# Patient Record
Sex: Female | Born: 1940 | Race: White | Hispanic: No | Smoking: Never smoker
Health system: Southern US, Community
[De-identification: ages and names within clinical notes are randomized; demographics above are authoritative.]

## PROBLEM LIST (undated history)

## (undated) DIAGNOSIS — M8580 Other specified disorders of bone density and structure, unspecified site: Secondary | ICD-10-CM

## (undated) DIAGNOSIS — R519 Headache, unspecified: Secondary | ICD-10-CM

## (undated) DIAGNOSIS — I10 Essential (primary) hypertension: Secondary | ICD-10-CM

## (undated) DIAGNOSIS — D691 Qualitative platelet defects: Secondary | ICD-10-CM

## (undated) DIAGNOSIS — F329 Major depressive disorder, single episode, unspecified: Secondary | ICD-10-CM

## (undated) DIAGNOSIS — F419 Anxiety disorder, unspecified: Secondary | ICD-10-CM

## (undated) DIAGNOSIS — E119 Type 2 diabetes mellitus without complications: Secondary | ICD-10-CM

## (undated) DIAGNOSIS — IMO0001 Reserved for inherently not codable concepts without codable children: Secondary | ICD-10-CM

## (undated) DIAGNOSIS — K219 Gastro-esophageal reflux disease without esophagitis: Secondary | ICD-10-CM

## (undated) DIAGNOSIS — J45909 Unspecified asthma, uncomplicated: Secondary | ICD-10-CM

## (undated) DIAGNOSIS — N390 Urinary tract infection, site not specified: Secondary | ICD-10-CM

## (undated) DIAGNOSIS — F0390 Unspecified dementia without behavioral disturbance: Secondary | ICD-10-CM

## (undated) DIAGNOSIS — G4730 Sleep apnea, unspecified: Secondary | ICD-10-CM

## (undated) DIAGNOSIS — C801 Malignant (primary) neoplasm, unspecified: Secondary | ICD-10-CM

## (undated) DIAGNOSIS — M1990 Unspecified osteoarthritis, unspecified site: Secondary | ICD-10-CM

## (undated) DIAGNOSIS — F32A Depression, unspecified: Secondary | ICD-10-CM

## (undated) DIAGNOSIS — R51 Headache: Secondary | ICD-10-CM

## (undated) DIAGNOSIS — I739 Peripheral vascular disease, unspecified: Secondary | ICD-10-CM

## (undated) HISTORY — DX: Depression, unspecified: F32.A

## (undated) HISTORY — PX: FOOT FRACTURE SURGERY: SHX645

## (undated) HISTORY — PX: COCHLEAR IMPLANT: SUR684

## (undated) HISTORY — PX: INTRAOCULAR LENS INSERTION: SHX110

## (undated) HISTORY — DX: Unspecified osteoarthritis, unspecified site: M19.90

## (undated) HISTORY — DX: Gastro-esophageal reflux disease without esophagitis: K21.9

## (undated) HISTORY — DX: Anxiety disorder, unspecified: F41.9

## (undated) HISTORY — DX: Qualitative platelet defects: D69.1

## (undated) HISTORY — DX: Other specified disorders of bone density and structure, unspecified site: M85.80

## (undated) HISTORY — DX: Type 2 diabetes mellitus without complications: E11.9

## (undated) HISTORY — DX: Major depressive disorder, single episode, unspecified: F32.9

---
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
## 1984-12-31 HISTORY — PX: ABDOMINAL HYSTERECTOMY: SHX81

## 1997-12-31 HISTORY — PX: CHOLECYSTECTOMY: SHX55

## 2006-09-26 ENCOUNTER — Emergency Department: Payer: Self-pay | Admitting: Emergency Medicine

## 2009-09-29 ENCOUNTER — Ambulatory Visit: Payer: Self-pay | Admitting: Family Medicine

## 2009-11-04 HISTORY — PX: HEEL SPUR SURGERY: SHX665

## 2011-10-11 ENCOUNTER — Ambulatory Visit: Payer: Self-pay | Admitting: Gastroenterology

## 2012-08-20 ENCOUNTER — Ambulatory Visit: Payer: Self-pay

## 2012-08-29 ENCOUNTER — Ambulatory Visit: Payer: Self-pay

## 2013-07-08 ENCOUNTER — Ambulatory Visit: Payer: Self-pay | Admitting: Family Medicine

## 2013-07-17 ENCOUNTER — Ambulatory Visit: Payer: Self-pay | Admitting: Hematology and Oncology

## 2013-07-17 LAB — CBC CANCER CENTER
Basophil #: 0 x10 3/mm (ref 0.0–0.1)
Basophil %: 0.7 %
Eosinophil #: 0.1 x10 3/mm (ref 0.0–0.7)
HGB: 12.9 g/dL (ref 12.0–16.0)
Lymphocyte #: 1.9 x10 3/mm (ref 1.0–3.6)
Lymphocyte %: 44.8 %
MCH: 29.4 pg (ref 26.0–34.0)
MCHC: 33.9 g/dL (ref 32.0–36.0)
MCV: 87 fL (ref 80–100)
Monocyte #: 0.3 x10 3/mm (ref 0.2–0.9)
Monocyte %: 8 %
Neutrophil #: 1.8 x10 3/mm (ref 1.4–6.5)
Neutrophil %: 43.5 %
Platelet: 91 x10 3/mm — ABNORMAL LOW (ref 150–440)
RBC: 4.41 10*6/uL (ref 3.80–5.20)
RDW: 15.4 % — ABNORMAL HIGH (ref 11.5–14.5)
WBC: 4.2 x10 3/mm (ref 3.6–11.0)

## 2013-07-31 ENCOUNTER — Ambulatory Visit: Payer: Self-pay | Admitting: Hematology and Oncology

## 2013-09-03 ENCOUNTER — Ambulatory Visit: Payer: Self-pay | Admitting: Hematology and Oncology

## 2013-09-03 LAB — CBC CANCER CENTER
Basophil #: 0 x10 3/mm (ref 0.0–0.1)
Eosinophil %: 3.1 %
HGB: 12.6 g/dL (ref 12.0–16.0)
Lymphocyte #: 1.6 x10 3/mm (ref 1.0–3.6)
MCH: 28.4 pg (ref 26.0–34.0)
MCHC: 32.7 g/dL (ref 32.0–36.0)
Monocyte #: 0.3 x10 3/mm (ref 0.2–0.9)
Neutrophil #: 1.3 x10 3/mm — ABNORMAL LOW (ref 1.4–6.5)
Neutrophil %: 40.3 %
Platelet: 88 x10 3/mm — ABNORMAL LOW (ref 150–440)
WBC: 3.3 x10 3/mm — ABNORMAL LOW (ref 3.6–11.0)

## 2013-09-10 LAB — CBC CANCER CENTER
Basophil #: 0 "x10 3/mm "
Basophil %: 1.1 %
Eosinophil #: 0.1 "x10 3/mm "
Eosinophil %: 2.3 %
HCT: 42.4 %
HGB: 13.8 g/dL
Lymphocyte %: 44 %
Lymphs Abs: 1.8 "x10 3/mm "
MCH: 28.1 pg
MCHC: 32.6 g/dL
MCV: 86 fL
Monocyte #: 0.3 "x10 3/mm "
Monocyte %: 6.5 %
Neutrophil #: 1.8 "x10 3/mm "
Neutrophil %: 46.1 %
Platelet: 100 "x10 3/mm " — ABNORMAL LOW
RBC: 4.91 "x10 6/mm "
RDW: 15.3 % — ABNORMAL HIGH
WBC: 4 "x10 3/mm "

## 2013-09-18 ENCOUNTER — Ambulatory Visit: Payer: Self-pay | Admitting: Family Medicine

## 2013-09-30 ENCOUNTER — Ambulatory Visit: Payer: Self-pay | Admitting: Hematology and Oncology

## 2014-03-10 ENCOUNTER — Ambulatory Visit: Payer: Self-pay | Admitting: Family Medicine

## 2015-03-11 LAB — HM DIABETES EYE EXAM

## 2015-05-23 ENCOUNTER — Ambulatory Visit
Admission: RE | Admit: 2015-05-23 | Discharge: 2015-05-23 | Disposition: A | Discharge status: Home / Self Care | Payer: PPO | Source: Ambulatory Visit | Attending: Family Medicine | Admitting: Family Medicine

## 2015-05-23 ENCOUNTER — Ambulatory Visit: Admission: RE | Admit: 2015-05-23 | Payer: PPO | Source: Ambulatory Visit

## 2015-05-23 ENCOUNTER — Other Ambulatory Visit: Payer: Self-pay | Admitting: Family Medicine

## 2015-05-23 DIAGNOSIS — R05 Cough: Secondary | ICD-10-CM

## 2015-05-23 DIAGNOSIS — R059 Cough, unspecified: Secondary | ICD-10-CM

## 2015-06-01 ENCOUNTER — Telehealth: Payer: Self-pay

## 2015-06-01 NOTE — Telephone Encounter (Signed)
Please get patient an appointment. She will need a spirometry and further work up. Thanks.

## 2015-06-01 NOTE — Telephone Encounter (Signed)
Pt called stated her cough with mucus is going on 12 weeks. Pt took the meds prescribed and the cough is still consistent. Would like recommendations on how to proceed. Please call pt @ [phone].

## 2015-06-08 ENCOUNTER — Telehealth: Payer: Self-pay | Admitting: Family Medicine

## 2015-06-08 NOTE — Telephone Encounter (Signed)
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 Her appt is June 27 th, she states that is to long to wait, she would like to know if she can be sent somewhere else.

## 2015-06-08 NOTE — Telephone Encounter (Signed)
Pt called would like Dr. Wynetta Emery to call her back ASAP @ [phone]. Thanks. Pt stresses it is very important that she talk with Dr. Lenna Sciara

## 2015-06-09 NOTE — Telephone Encounter (Signed)
[facility] so that Dr. Wynetta Emery can discuss this with Dr.Dasher.

## 2015-06-09 NOTE — Telephone Encounter (Signed)
Spoke with Dr. 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 He spoke to her last night. No worry about waiting. Will call patient to check in and see how she is doing.

## 2015-07-07 ENCOUNTER — Ambulatory Visit (INDEPENDENT_AMBULATORY_CARE_PROVIDER_SITE_OTHER): Payer: PPO | Admitting: Family Medicine

## 2015-07-07 ENCOUNTER — Encounter: Payer: Self-pay | Admitting: Family Medicine

## 2015-07-07 VITALS — BP 124/58 | HR 82 | Temp 98.0°F | Wt 223.0 lb

## 2015-07-07 DIAGNOSIS — F419 Anxiety disorder, unspecified: Secondary | ICD-10-CM

## 2015-07-07 DIAGNOSIS — M1990 Unspecified osteoarthritis, unspecified site: Secondary | ICD-10-CM | POA: Diagnosis not present

## 2015-07-07 DIAGNOSIS — B372 Candidiasis of skin and nail: Secondary | ICD-10-CM | POA: Insufficient documentation

## 2015-07-07 MED ORDER — NYSTATIN 100000 UNIT/GM EX POWD: CUTANEOUS | Status: DC

## 2015-07-07 MED ORDER — OXYCODONE HCL 10 MG PO TABS: 10.0000 mg | ORAL_TABLET | Freq: Every day | ORAL | Status: DC | PRN

## 2015-07-07 MED ORDER — ALPRAZOLAM 1 MG PO TABS: 1.0000 mg | ORAL_TABLET | Freq: Every evening | ORAL | Status: DC | PRN

## 2015-07-07 MED ORDER — DICLOFENAC SODIUM 75 MG PO TBEC: 75.0000 mg | DELAYED_RELEASE_TABLET | Freq: Two times a day (BID) | ORAL | Status: DC

## 2015-07-07 NOTE — Assessment & Plan Note (Signed)
Not able to take NSAIDs. Severe pain occasionally. Discussed the importance of taking this medication very occasionally. Controlled substance agreement signed today. Will give her 30 pills of oxycodone. Expect this to last about 3 months. Will follow up in 2 months to see how she is doing. Continue to monitor.

## 2015-07-07 NOTE — Assessment & Plan Note (Signed)
Acting up again. Will treat with nystatin powder as that helped previously. Rx given. Call if not getting better or getting worse.

## 2015-07-07 NOTE — Assessment & Plan Note (Signed)
Well controlled on her current regimen. Takes the xanax very occasionally for sleep. 30 pills given today. Expect this to last about 3 months. If using more, may need to discuss other options. Controlled substance agreement signed today. Continue to monitor closely.

## 2015-07-07 NOTE — Patient Instructions (Signed)
Arthritis, Nonspecific °Arthritis is inflammation of a joint. This usually means pain, redness, warmth or swelling are present. One or more joints may be involved. There are a number of types of arthritis. Your caregiver may not be able to tell what type of arthritis you have right away. °CAUSES  °The most common cause of arthritis is the wear and tear on the joint (osteoarthritis). This causes damage to the cartilage, which can break down over time. The knees, hips, back and neck are most often affected by this type of arthritis. °Other types of arthritis and common causes of joint pain include: °· Sprains and other injuries near the joint. Sometimes minor sprains and injuries cause pain and swelling that develop hours later. °· Rheumatoid arthritis. This affects hands, feet and knees. It usually affects both sides of your body at the same time. It is often associated with chronic ailments, fever, weight loss and general weakness. °· Crystal arthritis. Gout and pseudo gout can cause occasional acute severe pain, redness and swelling in the foot, ankle, or knee. °· Infectious arthritis. Bacteria can get into a joint through a break in overlying skin. This can cause infection of the joint. Bacteria and viruses can also spread through the blood and affect your joints. °· Drug, infectious and allergy reactions. Sometimes joints can become mildly painful and slightly swollen with these types of illnesses. °SYMPTOMS  °· Pain is the main symptom. °· Your joint or joints can also be red, swollen and warm or hot to the touch. °· You may have a fever with certain types of arthritis, or even feel overall ill. °· The joint with arthritis will hurt with movement. Stiffness is present with some types of arthritis. °DIAGNOSIS  °Your caregiver will suspect arthritis based on your description of your symptoms and on your exam. Testing may be needed to find the type of arthritis: °· Blood and sometimes urine tests. °· X-ray tests  and sometimes CT or MRI scans. °· Removal of fluid from the joint (arthrocentesis) is done to check for bacteria, crystals or other causes. Your caregiver (or a specialist) will numb the area over the joint with a local anesthetic, and use a needle to remove joint fluid for examination. This procedure is only minimally uncomfortable. °· Even with these tests, your caregiver may not be able to tell what kind of arthritis you have. Consultation with a specialist (rheumatologist) may be helpful. °TREATMENT  °Your caregiver will discuss with you treatment specific to your type of arthritis. If the specific type cannot be determined, then the following general recommendations may apply. °Treatment of severe joint pain includes: °· Rest. °· Elevation. °· Anti-inflammatory medication (for example, ibuprofen) may be prescribed. Avoiding activities that cause increased pain. °· Only take over-the-counter or prescription medicines for pain and discomfort as recommended by your caregiver. °· Cold packs over an inflamed joint may be used for 10 to 15 minutes every hour. Hot packs sometimes feel better, but do not use overnight. Do not use hot packs if you are diabetic without your caregiver's permission. °· A cortisone shot into arthritic joints may help reduce pain and swelling. °· Any acute arthritis that gets worse over the next 1 to 2 days needs to be looked at to be sure there is no joint infection. °Long-term arthritis treatment involves modifying activities and lifestyle to reduce joint stress jarring. This can include weight loss. Also, exercise is needed to nourish the joint cartilage and remove waste. This helps keep the muscles   around the joint strong. °HOME CARE INSTRUCTIONS  °· Do not take aspirin to relieve pain if gout is suspected. This elevates uric acid levels. °· Only take over-the-counter or prescription medicines for pain, discomfort or fever as directed by your caregiver. °· Rest the joint as much as  possible. °· If your joint is swollen, keep it elevated. °· Use crutches if the painful joint is in your leg. °· Drinking plenty of fluids may help for certain types of arthritis. °· Follow your caregiver's dietary instructions. °· Try low-impact exercise such as: °¨ Swimming. °¨ Water aerobics. °¨ Biking. °¨ Walking. °· Morning stiffness is often relieved by a warm shower. °· Put your joints through regular range-of-motion. °SEEK MEDICAL CARE IF:  °· You do not feel better in 24 hours or are getting worse. °· You have side effects to medications, or are not getting better with treatment. °SEEK IMMEDIATE MEDICAL CARE IF:  °· You have a fever. °· You develop severe joint pain, swelling or redness. °· Many joints are involved and become painful and swollen. °· There is severe back pain and/or leg weakness. °· You have loss of bowel or bladder control. °Document Released: 01/24/2005 Document Revised: 03/10/2012 Document Reviewed: 02/09/2009 °[employer]® Patient Information ©2015 [employer]. This information is not intended to replace advice given to you by your health care provider. Make sure you discuss any questions you have with your health care provider. ° °

## 2015-07-07 NOTE — Progress Notes (Signed)
BP 124/58 mmHg  Pulse 82  Temp(Src) 98 F (36.7 C)  Wt 223 lb (101.152 kg)  SpO2 93%   Subjective:    Patient ID: Dawn Moore, female    DOB: 1941/09/13, 74 y.o.   MRN: [id number]  HPI: Dawn Moore is a 74 y.o. female  Chief Complaint  Patient presents with  . Rash    Groin    RASH- Has been seeing derm and has been doing well. Got better with the powder but then was out in the heat and got over heated and it acted up again.  Duration:  chronic  Location: groin  Itching: yes Burning: yes Redness: yes Oozing: yes Scaling: no Blisters: no Painful: yes Fevers: no Change in detergents/soaps/personal care products: no Recent illness: no Recent travel:no History of same: yes Context: worse Alleviating factors: Nystatin powder Treatments attempted:nothing Shortness of breath: no  Throat/tongue swelling: no Myalgias/arthralgias: no   Has pain in her back and in her hip and feet. Known arthritis. Eased the the pain while she slept. Takes tylenol in the at night.  Uses the the voltarin gel- helps but doesn't have someone to rub it on for her. Had been on oxycodone previously and that had helped. Took it very rarely.   ANXIETY/STRESS Duration:stable Anxious mood: yes  Excessive worrying: yes Irritability: no  Sweating: no Nausea: no Palpitations:no Hyperventilation: no Panic attacks: no Agoraphobia: no  Obscessions/compulsions: no Depressed mood: no No flowsheet data found. Anhedonia: no Weight changes: no Insomnia: yes hard to fall asleep  Hypersomnia: no Fatigue/loss of energy: no Feelings of worthlessness: no Feelings of guilt: no Impaired concentration/indecisiveness: no Suicidal ideations: no  Crying spells: no  Relevant past medical, surgical, family and social history reviewed and updated as indicated. Interim medical history since our last visit reviewed. Allergies and medications reviewed and updated.  Review of Systems  Constitutional:  Negative.   Respiratory: Negative.   Cardiovascular: Negative.   Skin: Positive for rash. Negative for color change, pallor and wound.  Psychiatric/Behavioral: Negative.     Per HPI unless specifically indicated above     Objective:    BP 124/58 mmHg  Pulse 82  Temp(Src) 98 F (36.7 C)  Wt 223 lb (101.152 kg)  SpO2 93%  Wt Readings from Last 3 Encounters:  07/07/15 223 lb (101.152 kg)  05/23/15 224 lb (101.606 kg)    Physical Exam  Constitutional: She is oriented to person, place, and time. She appears well-developed and well-nourished. No distress.  HENT:  Head: Normocephalic and atraumatic.  Right Ear: Hearing normal.  Left Ear: Hearing normal.  Nose: Nose normal.  Eyes: Conjunctivae and lids are normal. Right eye exhibits no discharge. Left eye exhibits no discharge. No scleral icterus.  Cardiovascular: Normal rate, regular rhythm and normal heart sounds.  Exam reveals no gallop and no friction rub.   No murmur heard. Pulmonary/Chest: Effort normal and breath sounds normal. No respiratory distress. She has no wheezes. She has no rales. She exhibits no tenderness.  Musculoskeletal: Normal range of motion.  Neurological: She is alert and oriented to person, place, and time.  Skin: Skin is warm, dry and intact. No rash noted. No erythema. No pallor.  Psychiatric: She has a normal mood and affect. Her speech is normal and behavior is normal. Judgment and thought content normal. Cognition and memory are normal.      Assessment & Plan:   Problem List Items Addressed This Visit      Musculoskeletal and Integument  Arthritis    Not able to take NSAIDs. Severe pain occasionally. Discussed the importance of taking this medication very occasionally. Controlled substance agreement signed today. Will give her 30 pills of oxycodone. Expect this to last about 3 months. Will follow up in 2 months to see how she is doing. Continue to monitor.       Relevant Medications   aspirin  EC 81 MG tablet   diclofenac (VOLTAREN) 75 MG EC tablet   Oxycodone HCl 10 MG TABS   Candidal intertrigo - Primary    Acting up again. Will treat with nystatin powder as that helped previously. Rx given. Call if not getting better or getting worse.       Relevant Medications   nystatin (MYCOSTATIN/NYSTOP) 100000 UNIT/GM POWD     Other   Acute anxiety    Well controlled on her current regimen. Takes the xanax very occasionally for sleep. 30 pills given today. Expect this to last about 3 months. If using more, may need to discuss other options. Controlled substance agreement signed today. Continue to monitor closely.       Relevant Medications   ALPRAZolam (XANAX) 1 MG tablet       Follow up plan: Return in about 2 months (around 09/07/2015).

## 2015-07-08 ENCOUNTER — Telehealth: Payer: Self-pay | Admitting: Family Medicine

## 2015-07-08 NOTE — Telephone Encounter (Signed)
Pt would like to know why her alprazolam was not at the pharmacy and would like a call back at the number provided. i wasn't sure if it could be sent over or sent in but if it can be sent she would like it to go to [facility]

## 2015-07-08 NOTE — Telephone Encounter (Signed)
Called and spoke with patients daughter, they did not receive the prescription. I called to verify that the pharmacy did not have it. They did not have it, per Dr.Johnson I verbally called it in.

## 2015-07-08 NOTE — Telephone Encounter (Signed)
Notified daughter that medication was called in.

## 2015-07-13 ENCOUNTER — Telehealth: Payer: Self-pay | Admitting: Family Medicine

## 2015-07-13 NOTE — Telephone Encounter (Signed)
Pt's daughter called stated Dr. Lenna Sciara had called in Nystatin powder, wants to know if this can be changed to the cream. Lattie Haw stated her mother is having problems getting the powder on. Pharm is [facility] in [place]. Thanks.

## 2015-07-14 MED ORDER — NYSTATIN 100000 UNIT/GM EX CREA: 1.0000 "application " | TOPICAL_CREAM | Freq: Two times a day (BID) | CUTANEOUS | Status: DC

## 2015-07-14 NOTE — Telephone Encounter (Signed)
Rx sent to her pharmacy 

## 2015-07-17 ENCOUNTER — Emergency Department: Payer: PPO

## 2015-07-17 ENCOUNTER — Emergency Department
Admission: EM | Admit: 2015-07-17 | Discharge: 2015-07-17 | Disposition: A | Discharge status: Home / Self Care | Payer: PPO | Attending: Emergency Medicine | Admitting: Emergency Medicine

## 2015-07-17 ENCOUNTER — Encounter: Payer: Self-pay | Admitting: Emergency Medicine

## 2015-07-17 DIAGNOSIS — Z79899 Other long term (current) drug therapy: Secondary | ICD-10-CM | POA: Diagnosis not present

## 2015-07-17 DIAGNOSIS — Y9389 Activity, other specified: Secondary | ICD-10-CM | POA: Insufficient documentation

## 2015-07-17 DIAGNOSIS — W06XXXA Fall from bed, initial encounter: Secondary | ICD-10-CM | POA: Diagnosis not present

## 2015-07-17 DIAGNOSIS — Z7951 Long term (current) use of inhaled steroids: Secondary | ICD-10-CM | POA: Insufficient documentation

## 2015-07-17 DIAGNOSIS — Y998 Other external cause status: Secondary | ICD-10-CM | POA: Insufficient documentation

## 2015-07-17 DIAGNOSIS — S0101XA Laceration without foreign body of scalp, initial encounter: Secondary | ICD-10-CM

## 2015-07-17 DIAGNOSIS — S0102XA Laceration with foreign body of scalp, initial encounter: Secondary | ICD-10-CM | POA: Insufficient documentation

## 2015-07-17 DIAGNOSIS — Z7982 Long term (current) use of aspirin: Secondary | ICD-10-CM | POA: Insufficient documentation

## 2015-07-17 DIAGNOSIS — E119 Type 2 diabetes mellitus without complications: Secondary | ICD-10-CM | POA: Insufficient documentation

## 2015-07-17 DIAGNOSIS — Y9289 Other specified places as the place of occurrence of the external cause: Secondary | ICD-10-CM | POA: Insufficient documentation

## 2015-07-17 MED ORDER — LIDOCAINE HCL (PF) 1 % IJ SOLN
INTRAMUSCULAR | Status: AC
  Filled 2015-07-17: qty 5

## 2015-07-17 NOTE — ED Notes (Signed)
Pt rolled over and fell out of bed and hit nightstand and has laceration on her head (right side). Pt denies loc, states she was half asleep. Pt reports that it bled through 3 washcloths at home.

## 2015-07-17 NOTE — ED Provider Notes (Signed)
[facility] Emergency Department Provider Note  ____________________________________________  Time seen: 8:40 AM  I have reviewed the triage vital signs and the nursing notes.   HISTORY  Chief Complaint Fall and Head Laceration    HPI Dawn Moore is a 74 y.o. female presents with history of falling out of bed hitting her right parietal region on her nightstand this morning. Patient admits to difficulty controlling bleeding since the fall. Patient denies any weakness no numbness no dizziness.    Past Medical History  Diagnosis Date  . Anxiety   . Depression   . Diabetes mellitus without complication   . Arthritis     OA knees  . 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 GERD (gastroesophageal reflux disease)   . Osteopenia     Patient Active Problem List   Diagnosis Date Noted  . Arthritis 07/07/2015  . Acute anxiety 07/07/2015  . Candidal intertrigo 07/07/2015    Past Surgical History  Procedure Laterality Date  . Cholecystectomy  1999  . Abdominal hysterectomy  1986    still has one ovary  . Heel spur surgery Left 11/04/09    Revision 01/2011  . Foot fracture surgery      Current Outpatient Rx  Name  Route  Sig  Dispense  Refill  . ALPRAZolam (XANAX) 1 MG tablet   Oral   Take 1 tablet (1 mg total) by mouth at bedtime as needed for anxiety.   30 tablet   0   . aspirin EC 81 MG tablet   Oral   Take 81 mg by mouth.         . busPIRone (BUSPAR) 10 MG tablet   Oral   Take 10 mg by mouth 2 (two) times daily.         . Cholecalciferol (D 1000) 1000 UNITS capsule   Oral   Take by mouth.         . diclofenac (VOLTAREN) 75 MG EC tablet   Oral   Take 1 tablet (75 mg total) by mouth 2 (two) times daily.   30 tablet   0   . fluticasone (FLONASE) 50 MCG/ACT nasal spray   Each Nare   Place 2 sprays into both nostrils daily.         Marland Kitchen gabapentin (NEURONTIN) 300 MG capsule   Oral   Take 300 mg by mouth 3 (three)  times daily.      1   . glipiZIDE (GLUCOTROL) 5 MG tablet      TAKE 1 TABLET BY MOUTH EVERY MORNING BEFORE BREAKFAST      1   . metFORMIN (GLUCOPHAGE) 1000 MG tablet   Oral   Take 1,000 mg by mouth 2 (two) times daily with a meal.         . nystatin (MYCOSTATIN/NYSTOP) 100000 UNIT/GM POWD      APPLY TO AFFECTED AREA 3 TIMES A DAY   60 g   2   . nystatin cream (MYCOSTATIN)   Topical   Apply 1 application topically 2 (two) times daily.   30 g   0   . omeprazole (PRILOSEC) 20 MG capsule   Oral   Take 20 mg by mouth daily.      3   . Oxycodone HCl 10 MG TABS   Oral   Take 1 tablet (10 mg total) by mouth daily as needed.   30 tablet   0   . raloxifene (EVISTA) 60  MG tablet   Oral   Take 60 mg by mouth daily.      1   . simvastatin (ZOCOR) 10 MG tablet   Oral   Take 10 mg by mouth every evening.      2   . venlafaxine XR (EFFEXOR-XR) 150 MG 24 hr capsule   Oral   Take 150 mg by mouth daily.      2     Allergies Nsaids  Family History  Problem Relation Age of Onset  . Stroke Mother   . Dementia Mother   . Cancer Father     prostate  . Hyperlipidemia Son   . Hypertension Son   . Mental illness Son   . Alcohol abuse Son   . Arthritis Sister     rheumatoid  . Cancer Sister     breast  . Thyroid disease Sister   . Cancer Sister     thyroid  . Cancer Brother     prostate  . Arthritis Maternal Grandmother     Social History History  Substance Use Topics  . Smoking status: Never Smoker   . Smokeless tobacco: Never Used  . Alcohol Use: No    Review of Systems  Constitutional: Negative for fever. Eyes: Negative for visual changes. ENT: Negative for sore throat Cardiovascular: Negative for chest pain. Respiratory: Negative for shortness of breath. Gastrointestinal: Negative for abdominal pain, vomiting and diarrhea. Genitourinary: Negative for dysuria. Musculoskeletal: Negative for back pain. Skin: Right scalp  laceration. Neurological: Negative for headaches or focal weakness   10-point ROS otherwise negative.  ____________________________________________   PHYSICAL EXAM:  VITAL SIGNS: ED Triage Vitals  Enc Vitals Group     BP 07/17/15 0841 127/59 mmHg     Pulse Rate 07/17/15 0841 74     Resp --      Temp 07/17/15 0841 98 F (36.7 C)     Temp Source 07/17/15 0841 Oral     SpO2 07/17/15 0841 94 %     Weight 07/17/15 0841 220 lb (99.791 kg)     Height 07/17/15 0841 5\' 6"  (1.676 m)     Head Cir --      Peak Flow --      Pain Score 07/17/15 0837 9     Pain Loc --      Pain Edu? --      Excl. in [place]? --     Constitutional: Alert and oriented. Well appearing and in no distress. Eyes: Conjunctivae are normal.  ENT   Head: Normocephalic and atraumatic.   Mouth/Throat: Mucous membranes are moist. Cardiovascular: Normal rate, regular rhythm. Normal and symmetric distal pulses are present in all extremities. No murmurs, rubs, or gallops. Respiratory: Normal respiratory effort without tachypnea nor retractions. Breath sounds are clear and equal bilaterally.  Gastrointestinal: Soft and non-tender in all quadrants. No distention. There is no CVA tenderness. Genitourinary: deferred Musculoskeletal: Nontender with normal range of motion in all extremities. No lower extremity tenderness nor edema. Neurologic:  Normal speech and language. No gross focal neurologic deficits are appreciated. Skin:  Skin is warm, dry and intact. No rash noted. Psychiatric: Mood and affect are normal. Patient exhibits appropriate insight and judgment.     ____________________________________________    RADIOLOGY I have personally reviewed any xrays that were ordered on this patient:   ____________________________________________   PROCEDURES  Procedure(s) performed: LACERATION REPAIR Performed by: Gregor Hams Authorized by: Gregor Hams Consent: Verbal consent obtained. Risks and  benefits:  risks, benefits and alternatives were discussed Consent given by: patient Patient identity confirmed: provided demographic data Prepped and Draped in normal sterile fashion Wound explored  Laceration Location: right scalp  Laceration Length: 3cm  No Foreign Bodies seen or palpated  Anesthesia: local infiltration  Local anesthetic: lidocaine 1%  Anesthetic total: 4 ml  Irrigation method: syringe Amount of cleaning: standard  Staple: 3  Number of staples: 3    Patient tolerance: Patient tolerated the procedure well with no immediate complications.    ____________________________________________   INITIAL IMPRESSION / ASSESSMENT AND PLAN / ED COURSE  Pertinent labs & imaging results that were available during my care of the patient were reviewed by me and considered in my medical decision making (see chart for details).    ____________________________________________   FINAL CLINICAL IMPRESSION(S) / ED DIAGNOSES  Final diagnoses:  Scalp laceration, initial encounter     Gregor Hams, MD 07/17/15 1105

## 2015-07-17 NOTE — ED Notes (Signed)
Pt presents to the ER with right head injury after falling from bed and hitting right side on night stand.

## 2015-07-17 NOTE — ED Notes (Signed)
AAOx3.  Skin warm and dry. NAD.  Ambulates with easy and steady gait.   

## 2015-07-17 NOTE — Discharge Instructions (Signed)

## 2015-07-19 ENCOUNTER — Other Ambulatory Visit: Payer: Self-pay | Admitting: Family Medicine

## 2015-07-21 DIAGNOSIS — E1129 Type 2 diabetes mellitus with other diabetic kidney complication: Secondary | ICD-10-CM

## 2015-07-21 DIAGNOSIS — E785 Hyperlipidemia, unspecified: Secondary | ICD-10-CM | POA: Insufficient documentation

## 2015-07-21 DIAGNOSIS — F32A Depression, unspecified: Secondary | ICD-10-CM | POA: Insufficient documentation

## 2015-07-21 DIAGNOSIS — M159 Polyosteoarthritis, unspecified: Secondary | ICD-10-CM | POA: Insufficient documentation

## 2015-07-21 DIAGNOSIS — J45909 Unspecified asthma, uncomplicated: Secondary | ICD-10-CM

## 2015-07-21 DIAGNOSIS — E1340 Other specified diabetes mellitus with diabetic neuropathy, unspecified: Secondary | ICD-10-CM

## 2015-07-21 DIAGNOSIS — C44722 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, including hip: Secondary | ICD-10-CM

## 2015-07-21 DIAGNOSIS — R7401 Elevation of levels of liver transaminase levels: Secondary | ICD-10-CM

## 2015-07-21 DIAGNOSIS — E1140 Type 2 diabetes mellitus with diabetic neuropathy, unspecified: Secondary | ICD-10-CM | POA: Insufficient documentation

## 2015-07-21 DIAGNOSIS — R7402 Elevation of levels of lactic acid dehydrogenase (LDH): Secondary | ICD-10-CM | POA: Insufficient documentation

## 2015-07-21 DIAGNOSIS — M8580 Other specified disorders of bone density and structure, unspecified site: Secondary | ICD-10-CM | POA: Insufficient documentation

## 2015-07-21 DIAGNOSIS — K219 Gastro-esophageal reflux disease without esophagitis: Secondary | ICD-10-CM | POA: Insufficient documentation

## 2015-07-21 DIAGNOSIS — R740 Nonspecific elevation of levels of transaminase and lactic acid dehydrogenase [LDH]: Secondary | ICD-10-CM

## 2015-07-21 DIAGNOSIS — F329 Major depressive disorder, single episode, unspecified: Secondary | ICD-10-CM

## 2015-07-21 DIAGNOSIS — D696 Thrombocytopenia, unspecified: Secondary | ICD-10-CM

## 2015-07-25 ENCOUNTER — Ambulatory Visit (INDEPENDENT_AMBULATORY_CARE_PROVIDER_SITE_OTHER): Payer: PPO | Admitting: Unknown Physician Specialty

## 2015-07-25 ENCOUNTER — Telehealth: Payer: Self-pay | Admitting: Family Medicine

## 2015-07-25 VITALS — BP 157/71 | HR 91 | Temp 98.2°F | Ht 66.0 in | Wt 224.8 lb

## 2015-07-25 DIAGNOSIS — T148 Other injury of unspecified body region: Secondary | ICD-10-CM | POA: Diagnosis not present

## 2015-07-25 DIAGNOSIS — IMO0002 Reserved for concepts with insufficient information to code with codable children: Secondary | ICD-10-CM

## 2015-07-25 NOTE — Telephone Encounter (Signed)
Pt is having trouble breathing she was wondering if there was anything she could have that have/take to help with the wheezing and shortness of breath.

## 2015-07-25 NOTE — Progress Notes (Signed)
   BP 157/71 mmHg  Pulse 91  Temp(Src) 98.2 F (36.8 C)  Ht 5\' 6"  (1.676 m)  Wt 224 lb 12.8 oz (101.969 kg)  BMI 36.30 kg/m2  SpO2 91%   Subjective:    Patient ID: Dawn Moore, female    DOB: Dec 25, 1941, 75 y.o.   MRN: [id number]  HPI: Dawn Moore is a 74 y.o. female  Chief Complaint  Patient presents with  . Suture / Staple Removal   She has 2 scalp staples that needs to be removed.    Relevant past medical, surgical, family and social history reviewed and updated as indicated. Interim medical history since our last visit reviewed. Allergies and medications reviewed and updated.  Review of Systems  Per HPI unless specifically indicated above     Objective:    BP 157/71 mmHg  Pulse 91  Temp(Src) 98.2 F (36.8 C)  Ht 5\' 6"  (1.676 m)  Wt 224 lb 12.8 oz (101.969 kg)  BMI 36.30 kg/m2  SpO2 91%  Wt Readings from Last 3 Encounters:  07/25/15 224 lb 12.8 oz (101.969 kg)  05/23/15 224 lb (101.606 kg)  07/17/15 220 lb (99.791 kg)    Physical Exam   Sutures removed  Laceration is healed with edges approximated  Assessment & Plan:   Problem List Items Addressed This Visit    None    Visit Diagnoses    Laceration    -  Primary       2 staples removed  Follow up plan: Return if symptoms worsen or fail to improve.

## 2015-07-26 NOTE — Telephone Encounter (Signed)
Called and spoke at patient, she states that she feels better. I told her that if she gets worse, call in and schedule an appointment.

## 2015-07-26 NOTE — Telephone Encounter (Signed)
Needs an appointment. If none available should go to urgent care.

## 2015-07-26 NOTE — Telephone Encounter (Signed)
Forwarded to provider.

## 2015-08-09 ENCOUNTER — Telehealth: Payer: Self-pay | Admitting: Family Medicine

## 2015-08-09 NOTE — Telephone Encounter (Signed)
Pt requests call back concerning medication dosage. Please call ASAP. Thanks.

## 2015-08-09 NOTE — Telephone Encounter (Signed)
Called and spoke with patient she would like her Xanax  increased to two a day. I scheduled patient a appointment to be seen Friday and discuss this with the doctor.

## 2015-08-12 ENCOUNTER — Encounter: Payer: Self-pay | Admitting: Family Medicine

## 2015-08-12 ENCOUNTER — Ambulatory Visit (INDEPENDENT_AMBULATORY_CARE_PROVIDER_SITE_OTHER): Payer: PPO | Admitting: Family Medicine

## 2015-08-12 VITALS — BP 154/87 | HR 103 | Temp 97.7°F | Wt 220.4 lb

## 2015-08-12 DIAGNOSIS — F411 Generalized anxiety disorder: Secondary | ICD-10-CM | POA: Diagnosis not present

## 2015-08-12 MED ORDER — VENLAFAXINE HCL ER 150 MG PO CP24: 150.0000 mg | ORAL_CAPSULE | Freq: Every day | ORAL | Status: DC

## 2015-08-12 MED ORDER — ALPRAZOLAM 1 MG PO TABS: 1.0000 mg | ORAL_TABLET | Freq: Two times a day (BID) | ORAL | Status: DC | PRN

## 2015-08-12 MED ORDER — VENLAFAXINE HCL ER 75 MG PO CP24: 75.0000 mg | ORAL_CAPSULE | Freq: Every day | ORAL | Status: DC

## 2015-08-12 NOTE — Assessment & Plan Note (Signed)
Not under good control. Will increase her effexor to 225mg  daily. Discussed the dangers of xanax. Will continue it at night for now, and occasionally during the day. Discussed goal of getting her off of medication. Will go on 28 day schedule right now. Follow up in 3 months. Call if getting worse.

## 2015-08-12 NOTE — Progress Notes (Signed)
BP 154/87 mmHg  Pulse 103  Temp(Src) 97.7 F (36.5 C)  Wt 220 lb 6.4 oz (99.973 kg)  SpO2 93%   Subjective:    Patient ID: Dawn Moore, female    DOB: Aug 20, 1941, 74 y.o.   MRN: [id number]  HPI: Dawn Moore is a 74 y.o. female  Chief Complaint  Patient presents with  . Anxiety    patient would like a refill on Xanax.   ANXIETY/STRESS- has been on the xanax for years. Has been on the venlafaxine for about 3 months, doesn't feel like it is helping at all. Feels like the xanax helps, didn't know that it can cause increased risk of falling Duration:stable Anxious mood: yes  Excessive worrying: yes Irritability: yes  Sweating: yes Nausea: yes Palpitations:yes Hyperventilation: yes Panic attacks: yes Agoraphobia: yes  Obscessions/compulsions: no Depressed mood: yes Depression screen PHQ 2/9 08/12/2015  Decreased Interest 2  Down, Depressed, Hopeless 2  PHQ - 2 Score 4  Altered sleeping 3  Tired, decreased energy 2  Change in appetite 2  Feeling bad or failure about yourself  2  Trouble concentrating 2  Moving slowly or fidgety/restless 1  Suicidal thoughts 1  PHQ-9 Score 17  Difficult doing work/chores Very difficult    GAD7: 13 Anhedonia: no Weight changes: no Insomnia: yes hard to fall asleep  Hypersomnia: no Fatigue/loss of energy: yes Feelings of worthlessness: yes Feelings of guilt: yes Impaired concentration/indecisiveness: yes Suicidal ideations: yes- occasionally, says she would never do it Crying spells: yes Recent Stressors/Life Changes: yes  Relevant past medical, surgical, family and social history reviewed and updated as indicated. Interim medical history since our last visit reviewed. Allergies and medications reviewed and updated.  Review of Systems  Constitutional: Negative.   Respiratory: Negative.   Cardiovascular: Negative.   Gastrointestinal: Negative.   Musculoskeletal: Negative.   Skin: Negative.   Psychiatric/Behavioral:  Negative.     Per HPI unless specifically indicated above     Objective:    BP 154/87 mmHg  Pulse 103  Temp(Src) 97.7 F (36.5 C)  Wt 220 lb 6.4 oz (99.973 kg)  SpO2 93%  Wt Readings from Last 3 Encounters:  08/12/15 220 lb 6.4 oz (99.973 kg)  07/25/15 224 lb 12.8 oz (101.969 kg)  05/23/15 224 lb (101.606 kg)    Physical Exam  Constitutional: She is oriented to person, place, and time. She appears well-developed and well-nourished. No distress.  HENT:  Head: Normocephalic and atraumatic.  Right Ear: Hearing normal.  Left Ear: Hearing normal.  Nose: Nose normal.  Eyes: Conjunctivae and lids are normal. Right eye exhibits no discharge. Left eye exhibits no discharge. No scleral icterus.  Cardiovascular: Normal rate, regular rhythm and normal heart sounds.  Exam reveals no gallop and no friction rub.   No murmur heard. Pulmonary/Chest: Effort normal and breath sounds normal. No respiratory distress.  Musculoskeletal: Normal range of motion.  Neurological: She is alert and oriented to person, place, and time.  Skin: Skin is warm, dry and intact. No rash noted. No erythema. No pallor.  Psychiatric: She has a normal mood and affect. Her speech is normal and behavior is normal. Judgment and thought content normal. Cognition and memory are normal.  Nursing note and vitals reviewed.   Results for orders placed or performed in visit on 09/03/13  [facility]  Result Value Ref Range   WBC 3.3 (L) 3.6-11.0 x10 3/mm    RBC 4.42 3.80-5.20 x10 6/mm    HGB 12.6  12.0-16.0 g/dL   HCT 38.4 35.0-47.0 %   MCV 87 80-100 fL   MCH 28.4 26.0-34.0 pg   MCHC 32.7 32.0-36.0 g/dL   RDW 15.6 (H) 11.5-14.5 %   Platelet 88 (L) 150-440 x10 3/mm    Neutrophil % 40.3 %   Lymphocyte % 46.5 %   Monocyte % 9.3 %   Eosinophil % 3.1 %   Basophil % 0.8 %   Neutrophil # 1.3 (L) 1.4-6.5 x10 3/mm    Lymphocyte # 1.6 1.0-3.6 x10 3/mm    Monocyte # 0.3 0.2-0.9 x10 3/mm    Eosinophil # 0.1 0.0-0.7 x10  3/mm    Basophil # 0.0 0.0-0.1 x10 3/mm   [facility]  Result Value Ref Range   WBC 4.0 3.6-11.0 x10 3/mm    RBC 4.91 3.80-5.20 x10 6/mm    HGB 13.8 12.0-16.0 g/dL   HCT 42.4 35.0-47.0 %   MCV 86 80-100 fL   MCH 28.1 26.0-34.0 pg   MCHC 32.6 32.0-36.0 g/dL   RDW 15.3 (H) 11.5-14.5 %   Platelet 100 (L) 150-440 x10 3/mm    Neutrophil % 46.1 %   Lymphocyte % 44.0 %   Monocyte % 6.5 %   Eosinophil % 2.3 %   Basophil % 1.1 %   Neutrophil # 1.8 1.4-6.5 x10 3/mm    Lymphocyte # 1.8 1.0-3.6 x10 3/mm    Monocyte # 0.3 0.2-0.9 x10 3/mm    Eosinophil # 0.1 0.0-0.7 x10 3/mm    Basophil # 0.0 0.0-0.1 x10 3/mm       Assessment & Plan:   Problem List Items Addressed This Visit      Other   Anxiety disorder - Primary    Not under good control. Will increase her effexor to 225mg  daily. Discussed the dangers of xanax. Will continue it at night for now, and occasionally during the day. Discussed goal of getting her off of medication. Will go on 28 day schedule right now. Follow up in 3 months. Call if getting worse.           Follow up plan: Return in about 3 months (around 11/12/2015).

## 2015-08-30 ENCOUNTER — Telehealth: Payer: Self-pay | Admitting: Family Medicine

## 2015-08-30 NOTE — Telephone Encounter (Signed)
Forward to provider

## 2015-08-30 NOTE — Telephone Encounter (Signed)
Pt called is interested in trying Meloxicam for her arthritis. Pharm is [facility] in [place]. Thanks.

## 2015-08-31 NOTE — Telephone Encounter (Signed)
Meloxicam is an NSAID- which is listed as an allergy in her list. We'd need to talk about what reaction she had and why she was told she shouldn't take NSAIDs. If it was more of an issue with bleeding (it says per hematology) we can try voltaren gel because it's not absorbed as much, but I wouldn't put her on meloxicam.

## 2015-09-02 ENCOUNTER — Other Ambulatory Visit: Payer: Self-pay | Admitting: Unknown Physician Specialty

## 2015-09-02 MED ORDER — ALPRAZOLAM 1 MG PO TABS: 1.0000 mg | ORAL_TABLET | Freq: Two times a day (BID) | ORAL | Status: DC | PRN

## 2015-09-03 ENCOUNTER — Other Ambulatory Visit: Payer: Self-pay | Admitting: Family Medicine

## 2015-09-16 ENCOUNTER — Ambulatory Visit: Payer: PPO | Admitting: Family Medicine

## 2015-09-23 ENCOUNTER — Encounter: Payer: Self-pay | Admitting: Family Medicine

## 2015-09-23 ENCOUNTER — Ambulatory Visit (INDEPENDENT_AMBULATORY_CARE_PROVIDER_SITE_OTHER): Payer: PPO | Admitting: Family Medicine

## 2015-09-23 VITALS — BP 132/78 | HR 85 | Temp 98.3°F | Ht 66.2 in | Wt 218.0 lb

## 2015-09-23 DIAGNOSIS — E7800 Pure hypercholesterolemia, unspecified: Secondary | ICD-10-CM

## 2015-09-23 DIAGNOSIS — E1129 Type 2 diabetes mellitus with other diabetic kidney complication: Secondary | ICD-10-CM

## 2015-09-23 DIAGNOSIS — Z23 Encounter for immunization: Secondary | ICD-10-CM | POA: Diagnosis not present

## 2015-09-23 DIAGNOSIS — F32A Depression, unspecified: Secondary | ICD-10-CM

## 2015-09-23 DIAGNOSIS — E118 Type 2 diabetes mellitus with unspecified complications: Secondary | ICD-10-CM

## 2015-09-23 DIAGNOSIS — E785 Hyperlipidemia, unspecified: Secondary | ICD-10-CM | POA: Diagnosis not present

## 2015-09-23 DIAGNOSIS — F329 Major depressive disorder, single episode, unspecified: Secondary | ICD-10-CM | POA: Diagnosis not present

## 2015-09-23 DIAGNOSIS — E1121 Type 2 diabetes mellitus with diabetic nephropathy: Secondary | ICD-10-CM | POA: Diagnosis not present

## 2015-09-23 DIAGNOSIS — F411 Generalized anxiety disorder: Secondary | ICD-10-CM

## 2015-09-23 LAB — LP+ALT+AST PICCOLO, WAIVED
ALT (SGPT) Piccolo, Waived: 22 U/L (ref 10–47)
AST (SGOT) Piccolo, Waived: 31 U/L (ref 11–38)
CHOL/HDL RATIO PICCOLO,WAIVE: 2.7 mg/dL
Cholesterol Piccolo, Waived: 113 mg/dL (ref <200)
HDL Chol Piccolo, Waived: 42 mg/dL — ABNORMAL LOW (ref >59)
LDL CHOL CALC PICCOLO WAIVED: 41 mg/dL (ref <100)
TRIGLYCERIDES PICCOLO,WAIVED: 155 mg/dL — AB (ref <150)
VLDL CHOL CALC PICCOLO,WAIVE: 31 mg/dL — AB (ref <30)

## 2015-09-23 LAB — BAYER DCA HB A1C WAIVED: HB A1C (BAYER DCA - WAIVED): 5.7 % (ref <7.0)

## 2015-09-23 LAB — MICROALBUMIN, URINE WAIVED
Creatinine, Urine Waived: 200 mg/dL (ref 10–300)
Microalb, Ur Waived: 10 mg/L (ref 0–19)
Microalb/Creat Ratio: <30 mg/g (ref <30)

## 2015-09-23 NOTE — Assessment & Plan Note (Signed)
Doing much better on the higher dose of effexor. Continue that dose. Continue to monitor.

## 2015-09-23 NOTE — Assessment & Plan Note (Signed)
Under great control! Continue current regimen. Continue to monitor.

## 2015-09-23 NOTE — Assessment & Plan Note (Signed)
A1c came back today at 5.7! Doing great! Will stop glipizide and recheck in 3 months, if still low, will cut down on metformin.

## 2015-09-23 NOTE — Assessment & Plan Note (Signed)
Still not under great control, but doing better on the effexor. Continue 28 day cycle and follow up in 3 months.

## 2015-09-23 NOTE — Progress Notes (Signed)
BP 132/78 mmHg  Pulse 85  Temp(Src) 98.3 F (36.8 C)  Ht 5' 6.2" (1.681 m)  Wt 218 lb (98.884 kg)  BMI 34.99 kg/m2  SpO2 96%   Subjective:    Patient ID: Dawn Moore, female    DOB: 1941/09/09, 74 y.o.   MRN: [id number]  HPI: Dawn Moore is a 74 y.o. female  Chief Complaint  Patient presents with  . Diabetes   DIABETES Hypoglycemic episodes:no Polydipsia/polyuria: yes Visual disturbance: no Chest pain: no Paresthesias: no Glucose Monitoring: no Blood Pressure Monitoring: not checking Retinal Examination: Up to Date Foot Exam: Up to Date Diabetic Education: Completed Pneumovax: given today Influenza: Given today Aspirin: yes  ANXIETY/STRESS- feels like the increased medicine really helps and is working well for her. Feeling better and more like herself. Still needs the xanax, but doing better.  Duration:controlled Anxious mood: yes  Excessive worrying: yes Irritability: yes  Sweating: no Nausea: no Palpitations:no Hyperventilation: no Panic attacks: yes Agoraphobia: no  Obscessions/compulsions: no Depressed mood: no Depression screen [facility] 2/9 09/23/2015 08/12/2015  Decreased Interest - 2  Down, Depressed, Hopeless - 2  PHQ - 2 Score - 4  Altered sleeping 1 3  Tired, decreased energy 1 2  Change in appetite - 2  Feeling bad or failure about yourself  - 2  Trouble concentrating - 2  Moving slowly or fidgety/restless - 1  Suicidal thoughts - 1  PHQ-9 Score - 17  Difficult doing work/chores - Very difficult  GAD7: 19 Anhedonia: no Weight changes: yes Insomnia: no   Hypersomnia: no Fatigue/loss of energy: no Feelings of worthlessness: no Feelings of guilt: no Impaired concentration/indecisiveness: no Suicidal ideations: no  Crying spells: no Recent Stressors/Life Changes: no   HYPERTENSION / HYPERLIPIDEMIA Satisfied with current treatment? yes Duration of hypertension: chronic BP monitoring frequency: not checking BP medication side effects:  no Duration of hyperlipidemia: chronic Cholesterol medication side effects: no Cholesterol supplements: none Medication compliance: excellent compliance Aspirin: yes Recent stressors: no Recurrent headaches: yes Visual changes: no Palpitations: no Dyspnea: no Chest pain: no Lower extremity edema: no Dizzy/lightheaded: no   Relevant past medical, surgical, family and social history reviewed and updated as indicated. Interim medical history since our last visit reviewed. Allergies and medications reviewed and updated.  Review of Systems  Constitutional: Negative.   Respiratory: Negative.   Cardiovascular: Negative.   Gastrointestinal: Negative.   Musculoskeletal: Negative.   Psychiatric/Behavioral: Negative.    Per HPI unless specifically indicated above     Objective:    BP 132/78 mmHg  Pulse 85  Temp(Src) 98.3 F (36.8 C)  Ht 5' 6.2" (1.681 m)  Wt 218 lb (98.884 kg)  BMI 34.99 kg/m2  SpO2 96%  Wt Readings from Last 3 Encounters:  09/23/15 218 lb (98.884 kg)  08/12/15 220 lb 6.4 oz (99.973 kg)  07/25/15 224 lb 12.8 oz (101.969 kg)    Physical Exam  Constitutional: She is oriented to person, place, and time. She appears well-developed and well-nourished. No distress.  HENT:  Head: Normocephalic and atraumatic.  Right Ear: Hearing normal.  Left Ear: Hearing normal.  Nose: Nose normal.  Eyes: Conjunctivae and lids are normal. Right eye exhibits no discharge. Left eye exhibits no discharge. No scleral icterus.  Cardiovascular: Normal rate, regular rhythm, normal heart sounds and intact distal pulses.  Exam reveals no gallop and no friction rub.   No murmur heard. Pulmonary/Chest: Effort normal and breath sounds normal. No respiratory distress. She has no wheezes. She has  no rales. She exhibits no tenderness.  Musculoskeletal: Normal range of motion.  Neurological: She is alert and oriented to person, place, and time.  Skin: Skin is warm, dry and intact. No rash  noted. No erythema. No pallor.  Psychiatric: She has a normal mood and affect. Her speech is normal and behavior is normal. Judgment and thought content normal. Cognition and memory are normal.  Nursing note and vitals reviewed.   Results for orders placed or performed in visit on 09/23/15  Bayer DCA Hb A1c Waived  Result Value Ref Range   Bayer DCA Hb A1c Waived 5.7 <7.0 %  Microalbumin, Urine Waived  Result Value Ref Range   Microalb, Ur Waived 10 0 - 19 mg/L   Creatinine, Urine Waived 200 10 - 300 mg/dL   Microalb/Creat Ratio <30 <30 mg/g  LP+ALT+AST Piccolo, Waived  Result Value Ref Range   ALT (SGPT) Piccolo, Waived 22 10 - 47 U/L   AST (SGOT) Piccolo, Waived 31 11 - 38 U/L   Cholesterol Piccolo, Waived 113 <200 mg/dL   HDL Chol Piccolo, Waived 42 (L) >59 mg/dL   Triglycerides Piccolo,Waived 155 (H) <150 mg/dL   Chol/HDL Ratio Piccolo,Waive 2.7 mg/dL   LDL Chol Calc Piccolo Waived 41 <100 mg/dL   VLDL Chol Calc Piccolo,Waive 31 (H) <30 mg/dL      Assessment & Plan:   Problem List Items Addressed This Visit      Endocrine   Diabetes mellitus with renal manifestations, controlled - Primary    A1c came back today at 5.7! Doing great! Will stop glipizide and recheck in 3 months, if still low, will cut down on metformin.         Other   Anxiety disorder    Still not under great control, but doing better on the effexor. Continue 28 day cycle and follow up in 3 months.       Depression    Doing much better on the higher dose of effexor. Continue that dose. Continue to monitor.       Hyperlipidemia    Under great control! Continue current regimen. Continue to monitor.        Other Visit Diagnoses    Immunization due        Flu and pneumovax given today.     Type 2 diabetes mellitus with complication        Relevant Orders    Bayer DCA Hb A1c Waived (Completed)    Microalbumin, Urine Waived (Completed)    Comprehensive metabolic panel    High cholesterol         Relevant Orders    LP+ALT+AST Piccolo, Waived (Completed)        Follow up plan: Return in about 3 months (around 12/23/2015) for OK to cancel Nov. Appt. Marland Kitchen

## 2015-09-24 LAB — COMPREHENSIVE METABOLIC PANEL
A/G RATIO: 1.7 (ref 1.1–2.5)
ALT: 19 IU/L (ref 0–32)
AST: 28 IU/L (ref 0–40)
Albumin: 4.1 g/dL (ref 3.5–4.8)
Alkaline Phosphatase: 74 IU/L (ref 39–117)
BUN / CREAT RATIO: 12 (ref 11–26)
BUN: 11 mg/dL (ref 8–27)
Bilirubin Total: 0.4 mg/dL (ref 0.0–1.2)
CALCIUM: 9.1 mg/dL (ref 8.7–10.3)
CO2: 22 mmol/L (ref 18–29)
Chloride: 102 mmol/L (ref 97–108)
Creatinine, Ser: 0.9 mg/dL (ref 0.57–1.00)
GFR, EST AFRICAN AMERICAN: 73 mL/min/{1.73_m2} (ref >59)
GFR, EST NON AFRICAN AMERICAN: 64 mL/min/{1.73_m2} (ref >59)
GLOBULIN, TOTAL: 2.4 g/dL (ref 1.5–4.5)
Glucose: 109 mg/dL — ABNORMAL HIGH (ref 65–99)
Potassium: 4.7 mmol/L (ref 3.5–5.2)
SODIUM: 141 mmol/L (ref 134–144)
TOTAL PROTEIN: 6.5 g/dL (ref 6.0–8.5)

## 2015-09-26 ENCOUNTER — Encounter: Payer: Self-pay | Admitting: Family Medicine

## 2015-10-05 ENCOUNTER — Other Ambulatory Visit: Payer: Self-pay | Admitting: Family Medicine

## 2015-10-05 MED ORDER — ALPRAZOLAM 1 MG PO TABS: 1.0000 mg | ORAL_TABLET | Freq: Two times a day (BID) | ORAL | Status: DC | PRN

## 2015-11-04 ENCOUNTER — Other Ambulatory Visit: Payer: Self-pay | Admitting: Family Medicine

## 2015-11-04 ENCOUNTER — Ambulatory Visit: Payer: PPO | Admitting: Family Medicine

## 2015-11-04 MED ORDER — ALPRAZOLAM 1 MG PO TABS: 1.0000 mg | ORAL_TABLET | Freq: Two times a day (BID) | ORAL | Status: DC | PRN

## 2015-11-10 ENCOUNTER — Other Ambulatory Visit: Payer: Self-pay | Admitting: Family Medicine

## 2015-11-10 MED ORDER — ALPRAZOLAM 1 MG PO TABS: 1.0000 mg | ORAL_TABLET | Freq: Two times a day (BID) | ORAL | Status: DC | PRN

## 2015-11-29 ENCOUNTER — Other Ambulatory Visit: Payer: Self-pay | Admitting: Family Medicine

## 2015-11-29 ENCOUNTER — Encounter: Payer: Self-pay | Admitting: Family Medicine

## 2015-11-29 DIAGNOSIS — Z79899 Other long term (current) drug therapy: Secondary | ICD-10-CM | POA: Insufficient documentation

## 2015-11-29 MED ORDER — ALPRAZOLAM 1 MG PO TABS: 1.0000 mg | ORAL_TABLET | Freq: Two times a day (BID) | ORAL | Status: DC | PRN

## 2015-12-02 ENCOUNTER — Telehealth: Payer: Self-pay

## 2015-12-02 NOTE — Telephone Encounter (Signed)
Received a voicemail from the patient stating that her son was murdered on November 6 and that she has been very stressed. She would like to know if her Xanax can be increased. Let patient know that Dr.Johnson is out of the office until Monday. Forward to provider.

## 2015-12-03 NOTE — Telephone Encounter (Signed)
Will need an appointment

## 2015-12-05 ENCOUNTER — Other Ambulatory Visit: Payer: Self-pay | Admitting: Family Medicine

## 2015-12-05 MED ORDER — ALPRAZOLAM 1 MG PO TABS: 1.0000 mg | ORAL_TABLET | Freq: Three times a day (TID) | ORAL | Status: DC | PRN

## 2015-12-05 NOTE — Telephone Encounter (Signed)
Spoke with Dr.Johnson, she will increase the Xanax to 1 tid only if needed. Patient will come in for a visit on 12/30/15.

## 2015-12-05 NOTE — Progress Notes (Signed)
Patient can't get in until 12/30 due to her schedule. Son was murdered and she is very depressed and having a lot of issues, especially with the holidays approaching. Will increase xanax for this month to TID PRN- let her know that this can make depression worse, so did encourage her to be seen at that appointment to adjust other meds, but will not have her go through the holidays with panic attacks. Continue to monitor closely.

## 2015-12-07 ENCOUNTER — Telehealth: Payer: Self-pay

## 2015-12-07 NOTE — Telephone Encounter (Signed)
Patient is requesting a RX refill for C-Nifedipine Ointment 0.2%.  Please send RX to [facility].  [facility]'s phone number is [phone].

## 2015-12-30 ENCOUNTER — Ambulatory Visit (INDEPENDENT_AMBULATORY_CARE_PROVIDER_SITE_OTHER): Payer: PPO | Admitting: Family Medicine

## 2015-12-30 ENCOUNTER — Encounter: Payer: Self-pay | Admitting: Family Medicine

## 2015-12-30 VITALS — BP 127/81 | HR 90 | Temp 97.7°F | Ht 66.0 in | Wt 216.0 lb

## 2015-12-30 DIAGNOSIS — F32A Depression, unspecified: Secondary | ICD-10-CM

## 2015-12-30 DIAGNOSIS — E1121 Type 2 diabetes mellitus with diabetic nephropathy: Secondary | ICD-10-CM | POA: Diagnosis not present

## 2015-12-30 DIAGNOSIS — E1122 Type 2 diabetes mellitus with diabetic chronic kidney disease: Secondary | ICD-10-CM | POA: Diagnosis not present

## 2015-12-30 DIAGNOSIS — N182 Chronic kidney disease, stage 2 (mild): Secondary | ICD-10-CM

## 2015-12-30 DIAGNOSIS — F329 Major depressive disorder, single episode, unspecified: Secondary | ICD-10-CM | POA: Diagnosis not present

## 2015-12-30 LAB — BAYER DCA HB A1C WAIVED: HB A1C (BAYER DCA - WAIVED): 6.2 % (ref <7.0)

## 2015-12-30 MED ORDER — BUPROPION HCL ER (SR) 150 MG PO TB12: ORAL_TABLET | ORAL | Status: DC

## 2015-12-30 MED ORDER — METFORMIN HCL 1000 MG PO TABS: 500.0000 mg | ORAL_TABLET | Freq: Two times a day (BID) | ORAL | Status: DC

## 2015-12-30 MED ORDER — HYDROCORTISONE ACETATE 25 MG RE SUPP: 25.0000 mg | Freq: Two times a day (BID) | RECTAL | Status: DC

## 2015-12-30 NOTE — Assessment & Plan Note (Signed)
In exacerbation due to son's death. Will let us know if they want grief counseling in the future. Will continue current regimen with increased xanax at this time, and will add wellbutrin for better control. Continue effexor. Will check back in 1 month. Call with any concerns.

## 2015-12-30 NOTE — Assessment & Plan Note (Signed)
Doing well on current regimen, A1c up slightly, likely due to her eating more sweets over the holidays, still doing very well. Will decrease to 500mg  BID and check A1c again in 3 months.

## 2015-12-30 NOTE — Progress Notes (Signed)
BP 127/81 mmHg  Pulse 90  Temp(Src) 97.7 F (36.5 C)  Ht 5\' 6"  (1.676 m)  Wt 216 lb (97.977 kg)  BMI 34.88 kg/m2  SpO2 94%   Subjective:    Patient ID: Dawn Moore, female    DOB: 28-Dec-1941, 74 y.o.   MRN: [id number]  HPI: Dawn Moore is a 74 y.o. female  Chief Complaint  Patient presents with  . Diabetes   DIABETES Hypoglycemic episodes:no Polydipsia/polyuria: no Visual disturbance: no Chest pain: no Paresthesias: no Glucose Monitoring: no Taking Insulin?: no Blood Pressure Monitoring: not checking Retinal Examination: Up to Date Foot Exam: Up to Date Diabetic Education: Completed Pneumovax: Up to Date Influenza: Up to Date Aspirin: no  DEPRESSION- her son was killed in November and she has not been doing well. Her medicine is working, but she has been not able to leave the house and has been ruminating on things. Does not want to see a grief counselor at this time, but thinks that she needs something to help control the depression  Mood status: exacerbated Satisfied with current treatment?: no Symptom severity: severe  Duration of current treatment : chronic Side effects: no Medication compliance: excellent compliance Psychotherapy/counseling: no  Depressed mood: yes Anxious mood: yes Anhedonia: no Significant weight loss or gain: no Insomnia: yes hard to fall asleep Fatigue: yes Feelings of worthlessness or guilt: yes Impaired concentration/indecisiveness: yes Suicidal ideations: no Hopelessness: yes Crying spells: no Depression screen [facility] 2/9 09/23/2015 08/12/2015  Decreased Interest - 2  Down, Depressed, Hopeless - 2  PHQ - 2 Score - 4  Altered sleeping 1 3  Tired, decreased energy 1 2  Change in appetite - 2  Feeling bad or failure about yourself  - 2  Trouble concentrating - 2  Moving slowly or fidgety/restless - 1  Suicidal thoughts - 1  PHQ-9 Score - 17  Difficult doing work/chores - Very difficult    Relevant past medical, surgical,  family and social history reviewed and updated as indicated. Interim medical history since our last visit reviewed. Allergies and medications reviewed and updated.  Review of Systems  Constitutional: Negative.   Respiratory: Negative.   Cardiovascular: Negative.   Psychiatric/Behavioral: Positive for sleep disturbance and dysphoric mood. Negative for suicidal ideas, hallucinations, behavioral problems, confusion, self-injury, decreased concentration and agitation. The patient is nervous/anxious. The patient is not hyperactive.     Per HPI unless specifically indicated above     Objective:    BP 127/81 mmHg  Pulse 90  Temp(Src) 97.7 F (36.5 C)  Ht 5\' 6"  (1.676 m)  Wt 216 lb (97.977 kg)  BMI 34.88 kg/m2  SpO2 94%  Wt Readings from Last 3 Encounters:  12/30/15 216 lb (97.977 kg)  09/23/15 218 lb (98.884 kg)  08/12/15 220 lb 6.4 oz (99.973 kg)    Physical Exam  Constitutional: She is oriented to person, place, and time. She appears well-developed and well-nourished. No distress.  HENT:  Head: Normocephalic and atraumatic.  Right Ear: Hearing normal.  Left Ear: Hearing normal.  Nose: Nose normal.  Eyes: Conjunctivae and lids are normal. Right eye exhibits no discharge. Left eye exhibits no discharge. No scleral icterus.  Cardiovascular: Normal rate, regular rhythm, normal heart sounds and intact distal pulses.  Exam reveals no gallop and no friction rub.   No murmur heard. Pulmonary/Chest: Effort normal and breath sounds normal. No respiratory distress. She has no wheezes. She has no rales. She exhibits no tenderness.  Musculoskeletal: Normal range of motion.  Neurological: She is alert and oriented to person, place, and time.  Skin: Skin is warm, dry and intact. No rash noted. No erythema. No pallor.  Psychiatric: Her speech is normal and behavior is normal. Judgment and thought content normal. Cognition and memory are normal. She exhibits a depressed mood.  Nursing note and  vitals reviewed.   Results for orders placed or performed in visit on 12/30/15  Bayer DCA Hb A1c Waived  Result Value Ref Range   Bayer DCA Hb A1c Waived 6.2 <7.0 %      Assessment & Plan:   Problem List Items Addressed This Visit      Endocrine   Diabetes mellitus with renal manifestations, controlled ([place])    Doing well on current regimen, A1c up slightly, likely due to her eating more sweets over the holidays, still doing very well. Will decrease to 500mg  BID and check A1c again in 3 months.       Relevant Medications   metFORMIN (GLUCOPHAGE) 1000 MG tablet     Other   Depression    In exacerbation due to son's death. Will let us know if they want grief counseling in the future. Will continue current regimen with increased xanax at this time, and will add wellbutrin for better control. Continue effexor. Will check back in 1 month. Call with any concerns.       Relevant Medications   buPROPion (WELLBUTRIN SR) 150 MG 12 hr tablet    Other Visit Diagnoses    Type 2 diabetes mellitus with diabetic nephropathy, without long-term current use of insulin (H[facility])    -  Primary    Relevant Medications    metFORMIN (GLUCOPHAGE) 1000 MG tablet    Other Relevant Orders    Bayer DCA Hb A1c Waived (Completed)        Follow up plan: Return in about 4 weeks (around 01/27/2016) for Depression follow up.

## 2016-01-19 ENCOUNTER — Other Ambulatory Visit: Payer: Self-pay | Admitting: Family Medicine

## 2016-01-21 ENCOUNTER — Other Ambulatory Visit: Payer: Self-pay | Admitting: Family Medicine

## 2016-01-25 ENCOUNTER — Other Ambulatory Visit: Payer: Self-pay | Admitting: Family Medicine

## 2016-01-25 MED ORDER — ALPRAZOLAM 1 MG PO TABS: 1.0000 mg | ORAL_TABLET | Freq: Three times a day (TID) | ORAL | Status: DC | PRN

## 2016-01-30 ENCOUNTER — Encounter: Payer: Self-pay | Admitting: Family Medicine

## 2016-01-30 ENCOUNTER — Ambulatory Visit (INDEPENDENT_AMBULATORY_CARE_PROVIDER_SITE_OTHER): Payer: Commercial Managed Care - HMO | Admitting: Family Medicine

## 2016-01-30 VITALS — BP 122/80 | HR 89 | Temp 98.2°F | Ht 65.6 in | Wt 218.0 lb

## 2016-01-30 DIAGNOSIS — F32A Depression, unspecified: Secondary | ICD-10-CM

## 2016-01-30 DIAGNOSIS — F329 Major depressive disorder, single episode, unspecified: Secondary | ICD-10-CM

## 2016-01-30 MED ORDER — ARIPIPRAZOLE 2 MG PO TABS: 2.0000 mg | ORAL_TABLET | Freq: Every day | ORAL | Status: DC

## 2016-01-30 NOTE — Assessment & Plan Note (Signed)
Not doing well. Couldn't tolerate wellbutrin. Will start abilify low dose. Recheck 1 month. Continue current regimen. Continue to monitor.

## 2016-01-30 NOTE — Progress Notes (Signed)
BP 122/80 mmHg  Pulse 89  Temp(Src) 98.2 F (36.8 C)  Ht 5' 5.6" (1.666 m)  Wt 218 lb (98.884 kg)  BMI 35.63 kg/m2  SpO2 92%   Subjective:    Patient ID: Dawn Moore, female    DOB: 1941-01-25, 75 y.o.   MRN: [id number]  HPI: Dawn Moore is a 74 y.o. female  Chief Complaint  Patient presents with  . Depression   DEPRESSION- couldn't take the wellbutrin, made her really shakey and irritable. Was on abilify in the past. Remembers taking it, remembers coming off of it because it was too expensive Mood status: stable Satisfied with current treatment?: no Symptom severity: severe  Duration of current treatment : chronic Side effects: no Medication compliance: excellent compliance Psychotherapy/counseling: no  Depressed mood: yes Anxious mood: yes Anhedonia: yes Significant weight loss or gain: no Insomnia: no  Fatigue: yes Feelings of worthlessness or guilt: yes Impaired concentration/indecisiveness: yes Suicidal ideations: yes Hopelessness: yes Crying spells: yes Depression screen [facility] 2/9 01/30/2016 09/23/2015 08/12/2015  Decreased Interest 3 - 2  Down, Depressed, Hopeless 3 - 2  PHQ - 2 Score 6 - 4  Altered sleeping 3 1 3   Tired, decreased energy 3 1 2   Change in appetite 3 - 2  Feeling bad or failure about yourself  2 - 2  Trouble concentrating 3 - 2  Moving slowly or fidgety/restless 2 - 1  Suicidal thoughts 2 - 1  PHQ-9 Score 24 - 17  Difficult doing work/chores Extremely dIfficult - Very difficult   Relevant past medical, surgical, family and social history reviewed and updated as indicated. Interim medical history since our last visit reviewed. Allergies and medications reviewed and updated.  Review of Systems  Constitutional: Negative.   Respiratory: Negative.   Cardiovascular: Negative.   Gastrointestinal: Negative.   Psychiatric/Behavioral: Negative.     Per HPI unless specifically indicated above     Objective:    BP 122/80 mmHg  Pulse 89   Temp(Src) 98.2 F (36.8 C)  Ht 5' 5.6" (1.666 m)  Wt 218 lb (98.884 kg)  BMI 35.63 kg/m2  SpO2 92%  Wt Readings from Last 3 Encounters:  01/30/16 218 lb (98.884 kg)  12/30/15 216 lb (97.977 kg)  09/23/15 218 lb (98.884 kg)    Physical Exam  Constitutional: She is oriented to person, place, and time. She appears well-developed and well-nourished. No distress.  HENT:  Head: Normocephalic and atraumatic.  Right Ear: Hearing normal.  Left Ear: Hearing normal.  Nose: Nose normal.  Eyes: Conjunctivae and lids are normal. Right eye exhibits no discharge. Left eye exhibits no discharge. No scleral icterus.  Cardiovascular: Normal rate, regular rhythm, normal heart sounds and intact distal pulses.  Exam reveals no gallop and no friction rub.   No murmur heard. Pulmonary/Chest: Effort normal and breath sounds normal. No respiratory distress. She has no wheezes. She has no rales. She exhibits no tenderness.  Musculoskeletal: Normal range of motion.  Neurological: She is alert and oriented to person, place, and time.  Skin: Skin is warm, dry and intact. No rash noted. No erythema. No pallor.  Psychiatric: Her speech is normal and behavior is normal. Judgment and thought content normal. Cognition and memory are normal. She exhibits a depressed mood.  Nursing note and vitals reviewed.   Results for orders placed or performed in visit on 12/30/15  Bayer DCA Hb A1c Waived  Result Value Ref Range   Bayer DCA Hb A1c Waived 6.2 <7.0 %  Assessment & Plan:   Problem List Items Addressed This Visit      Other   Depression - Primary    Not doing well. Couldn't tolerate wellbutrin. Will start abilify low dose. Recheck 1 month. Continue current regimen. Continue to monitor.           Follow up plan: Return in about 4 weeks (around 02/27/2016) for Follow up depression.

## 2016-02-02 ENCOUNTER — Other Ambulatory Visit: Payer: Self-pay | Admitting: Family Medicine

## 2016-02-03 ENCOUNTER — Other Ambulatory Visit: Payer: Self-pay | Admitting: Family Medicine

## 2016-02-03 ENCOUNTER — Telehealth: Payer: Self-pay | Admitting: Family Medicine

## 2016-02-03 NOTE — Telephone Encounter (Signed)
She states that she just cant afford this medication right now, she owes taxes for 2015. She states that this is her copay for the medications.

## 2016-02-03 NOTE — Telephone Encounter (Signed)
Forward to provider

## 2016-02-03 NOTE — Telephone Encounter (Signed)
Can we check with the pharmacy. Buspar is cheap and I checked on abilify

## 2016-02-03 NOTE — Telephone Encounter (Signed)
Pt called in and stated that she was going to stop taking ablilify, buspar and evista because of the cost and she would like to know if there was anything else she could take that would be less expensive.

## 2016-02-09 ENCOUNTER — Telehealth: Payer: Self-pay | Admitting: Family Medicine

## 2016-02-09 DIAGNOSIS — E1122 Type 2 diabetes mellitus with diabetic chronic kidney disease: Secondary | ICD-10-CM

## 2016-02-09 DIAGNOSIS — N182 Chronic kidney disease, stage 2 (mild): Principal | ICD-10-CM

## 2016-02-09 NOTE — Telephone Encounter (Signed)
Patient daughter called stating that she wants a referreal to [facility], to see Dr. Murvin Natal. She has an appointment on Monday and needs a referral from Dr. Wynetta Emery to be seen.

## 2016-02-09 NOTE — Telephone Encounter (Signed)
Forward to provider

## 2016-02-16 DIAGNOSIS — E113292 Type 2 diabetes mellitus with mild nonproliferative diabetic retinopathy without macular edema, left eye: Secondary | ICD-10-CM | POA: Diagnosis not present

## 2016-02-16 LAB — HM DIABETES EYE EXAM

## 2016-02-20 ENCOUNTER — Telehealth: Payer: Self-pay

## 2016-02-20 ENCOUNTER — Other Ambulatory Visit: Payer: Self-pay | Admitting: Family Medicine

## 2016-02-20 MED ORDER — NYSTATIN 100000 UNIT/GM EX CREA: TOPICAL_CREAM | CUTANEOUS | Status: DC

## 2016-02-20 MED ORDER — GABAPENTIN 300 MG PO CAPS: 300.0000 mg | ORAL_CAPSULE | Freq: Three times a day (TID) | ORAL | Status: DC

## 2016-02-20 MED ORDER — OMEPRAZOLE 20 MG PO CPDR: 20.0000 mg | DELAYED_RELEASE_CAPSULE | Freq: Every day | ORAL | Status: DC

## 2016-02-20 MED ORDER — DICLOFENAC SODIUM 75 MG PO TBEC: 75.0000 mg | DELAYED_RELEASE_TABLET | Freq: Two times a day (BID) | ORAL | Status: DC

## 2016-02-20 MED ORDER — SIMVASTATIN 10 MG PO TABS: 10.0000 mg | ORAL_TABLET | Freq: Every evening | ORAL | Status: DC

## 2016-02-20 MED ORDER — ALPRAZOLAM 1 MG PO TABS: 1.0000 mg | ORAL_TABLET | Freq: Three times a day (TID) | ORAL | Status: DC | PRN

## 2016-02-20 MED ORDER — METFORMIN HCL 1000 MG PO TABS: 500.0000 mg | ORAL_TABLET | Freq: Two times a day (BID) | ORAL | Status: DC

## 2016-02-20 MED ORDER — BUSPIRONE HCL 10 MG PO TABS: 10.0000 mg | ORAL_TABLET | Freq: Two times a day (BID) | ORAL | Status: DC

## 2016-02-20 MED ORDER — VENLAFAXINE HCL ER 75 MG PO CP24: ORAL_CAPSULE | ORAL | Status: DC

## 2016-02-20 MED ORDER — VENLAFAXINE HCL ER 150 MG PO CP24: ORAL_CAPSULE | ORAL | Status: DC

## 2016-02-20 NOTE — Telephone Encounter (Signed)
Also patient can not afford the Evista, so she is not taking it.

## 2016-02-20 NOTE — Telephone Encounter (Signed)
Patient would like to have the following sent to [place], she will get them for free Buspirone 10mg  tablet Omeprazole 20 capsule venlaflaxine 150mg  capsule  vanlafaxine 75mg  capsule Diclofenac sodium 75mg  Gabapentin 300mg  capsule Metformin 500mg  tablet Simvastatin 10mg  tablet  Nystatin 100,000 units/gm cream

## 2016-02-20 NOTE — Telephone Encounter (Signed)
Rxs sent to her pharmacy 

## 2016-02-23 DIAGNOSIS — H2513 Age-related nuclear cataract, bilateral: Secondary | ICD-10-CM | POA: Diagnosis not present

## 2016-02-27 ENCOUNTER — Ambulatory Visit (INDEPENDENT_AMBULATORY_CARE_PROVIDER_SITE_OTHER): Payer: Medicare HMO | Admitting: Family Medicine

## 2016-02-27 ENCOUNTER — Encounter: Payer: Self-pay | Admitting: Family Medicine

## 2016-02-27 VITALS — BP 153/80 | HR 81 | Temp 97.5°F | Ht 65.6 in | Wt 225.0 lb

## 2016-02-27 DIAGNOSIS — Z1231 Encounter for screening mammogram for malignant neoplasm of breast: Secondary | ICD-10-CM

## 2016-02-27 DIAGNOSIS — M8580 Other specified disorders of bone density and structure, unspecified site: Secondary | ICD-10-CM

## 2016-02-27 DIAGNOSIS — F329 Major depressive disorder, single episode, unspecified: Secondary | ICD-10-CM | POA: Diagnosis not present

## 2016-02-27 DIAGNOSIS — F32A Depression, unspecified: Secondary | ICD-10-CM

## 2016-02-27 MED ORDER — QUETIAPINE FUMARATE 25 MG PO TABS: 25.0000 mg | ORAL_TABLET | Freq: Every day | ORAL | Status: DC

## 2016-02-27 NOTE — Progress Notes (Signed)
BP 153/80 mmHg  Pulse 81  Temp(Src) 97.5 F (36.4 C)  Ht 5' 5.6" (1.666 m)  Wt 225 lb (102.059 kg)  BMI 36.77 kg/m2  SpO2 93%   Subjective:    Patient ID: Dawn Moore, female    DOB: [birth year], 75 y.o.   MRN: [id number]  HPI: Dawn Moore is a 75 y.o. female  Chief Complaint  Patient presents with  . Depression   DEPRESSION- abilify was too expensive Mood status: stable Satisfied with current treatment?: no Symptom severity: severe  Duration of current treatment : chronic Side effects: no Medication compliance: excellent compliance Psychotherapy/counseling: no  Depressed mood: yes Anxious mood: yes Anhedonia: no Significant weight loss or gain: yes Insomnia: no  Fatigue: yes Feelings of worthlessness or guilt: yes Impaired concentration/indecisiveness: yes Suicidal ideations: no Hopelessness: yes Crying spells: yes Depression screen [facility] 2/9 02/27/2016 01/30/2016 09/23/2015 08/12/2015  Decreased Interest 2 3 - 2  Down, Depressed, Hopeless 2 3 - 2  PHQ - 2 Score 4 6 - 4  Altered sleeping 2 3 1 3   Tired, decreased energy 2 3 1 2   Change in appetite 3 3 - 2  Feeling bad or failure about yourself  2 2 - 2  Trouble concentrating 2 3 - 2  Moving slowly or fidgety/restless 2 2 - 1  Suicidal thoughts 2 2 - 1  PHQ-9 Score 19 24 - 17  Difficult doing work/chores Very difficult Extremely dIfficult - Very difficult   GAD 7 : Generalized Anxiety Score 02/27/2016  Nervous, Anxious, on Edge 3  Control/stop worrying 3  Worry too much - different things 3  Trouble relaxing 1  Restless 1  Easily annoyed or irritable 3  Afraid - awful might happen 2  Total GAD 7 Score 16  Anxiety Difficulty Somewhat difficult    Relevant past medical, surgical, family and social history reviewed and updated as indicated. Interim medical history since our last visit reviewed. Allergies and medications reviewed and updated.  Review of Systems  Constitutional: Negative.   HENT: Negative.    Respiratory: Negative.   Cardiovascular: Negative.   Skin: Negative.   Psychiatric/Behavioral: Positive for sleep disturbance, dysphoric mood and decreased concentration. Negative for suicidal ideas, hallucinations, behavioral problems, confusion, self-injury and agitation. The patient is nervous/anxious. The patient is not hyperactive.     Per HPI unless specifically indicated above     Objective:    BP 153/80 mmHg  Pulse 81  Temp(Src) 97.5 F (36.4 C)  Ht 5' 5.6" (1.666 m)  Wt 225 lb (102.059 kg)  BMI 36.77 kg/m2  SpO2 93%  Wt Readings from Last 3 Encounters:  02/27/16 225 lb (102.059 kg)  01/30/16 218 lb (98.884 kg)  12/30/15 216 lb (97.977 kg)    Physical Exam  Constitutional: She is oriented to person, place, and time. She appears well-developed and well-nourished. No distress.  HENT:  Head: Normocephalic and atraumatic.  Right Ear: Hearing and external ear normal.  Left Ear: Hearing and external ear normal.  Nose: Nose normal.  Mouth/Throat: Oropharynx is clear and moist. No oropharyngeal exudate.  Eyes: Conjunctivae and lids are normal. Right eye exhibits no discharge. Left eye exhibits no discharge. No scleral icterus.  Neck: Normal range of motion. Neck supple. No JVD present. No tracheal deviation present. No thyromegaly present.  Cardiovascular: Normal rate, regular rhythm, normal heart sounds and intact distal pulses.  Exam reveals no gallop and no friction rub.   No murmur heard. Pulmonary/Chest: Effort normal and breath  sounds normal. No stridor. No respiratory distress. She has no wheezes. She has no rales. She exhibits no tenderness.  Musculoskeletal: Normal range of motion.  Lymphadenopathy:    She has no cervical adenopathy.  Neurological: She is alert and oriented to person, place, and time.  Skin: Skin is warm, dry and intact. No rash noted. She is not diaphoretic. No erythema. No pallor.  Psychiatric: Her speech is normal and behavior is normal.  Judgment and thought content normal. Cognition and memory are normal. She exhibits a depressed mood.  Nursing note and vitals reviewed.   Results for orders placed or performed in visit on 02/17/16  HM DIABETES EYE EXAM  Result Value Ref Range   HM Diabetic Eye Exam No Retinopathy No Retinopathy      Assessment & Plan:   Problem List Items Addressed This Visit      Musculoskeletal and Integument   Osteopenia   Relevant Orders   DG Bone Density     Other   Depression - Primary    Not well controlled. Could not afford abilify. Will try seroquel to see if it helps. Recheck 1 month. Can't afford counseling at this time. Continue to monitor call with any concerns.         Other Visit Diagnoses    Encounter for screening mammogram for breast cancer        Mammogram ordered today.     Relevant Orders    MM Digital Screening        Follow up plan: Return in about 4 weeks (around 03/26/2016) for DM follow up and mood follow up.

## 2016-02-27 NOTE — Assessment & Plan Note (Signed)
Not well controlled. Could not afford abilify. Will try seroquel to see if it helps. Recheck 1 month. Can't afford counseling at this time. Continue to monitor call with any concerns.

## 2016-03-02 NOTE — Discharge Instructions (Signed)

## 2016-03-07 ENCOUNTER — Ambulatory Visit: Payer: Commercial Managed Care - HMO | Admitting: Anesthesiology

## 2016-03-07 ENCOUNTER — Encounter
Admission: RE | Disposition: A | Discharge status: Home / Self Care | Payer: Self-pay | Source: Ambulatory Visit | Attending: Ophthalmology

## 2016-03-07 ENCOUNTER — Ambulatory Visit
Admission: RE | Admit: 2016-03-07 | Discharge: 2016-03-07 | Disposition: A | Discharge status: Home / Self Care | Payer: Commercial Managed Care - HMO | Source: Ambulatory Visit | Attending: Ophthalmology | Admitting: Ophthalmology

## 2016-03-07 DIAGNOSIS — Z90710 Acquired absence of both cervix and uterus: Secondary | ICD-10-CM | POA: Insufficient documentation

## 2016-03-07 DIAGNOSIS — E119 Type 2 diabetes mellitus without complications: Secondary | ICD-10-CM | POA: Insufficient documentation

## 2016-03-07 DIAGNOSIS — Z8701 Personal history of pneumonia (recurrent): Secondary | ICD-10-CM | POA: Insufficient documentation

## 2016-03-07 DIAGNOSIS — Z79899 Other long term (current) drug therapy: Secondary | ICD-10-CM | POA: Insufficient documentation

## 2016-03-07 DIAGNOSIS — E7800 Pure hypercholesterolemia, unspecified: Secondary | ICD-10-CM | POA: Insufficient documentation

## 2016-03-07 DIAGNOSIS — G4730 Sleep apnea, unspecified: Secondary | ICD-10-CM | POA: Insufficient documentation

## 2016-03-07 DIAGNOSIS — Z9889 Other specified postprocedural states: Secondary | ICD-10-CM | POA: Diagnosis not present

## 2016-03-07 DIAGNOSIS — H2513 Age-related nuclear cataract, bilateral: Secondary | ICD-10-CM | POA: Diagnosis not present

## 2016-03-07 DIAGNOSIS — Z85828 Personal history of other malignant neoplasm of skin: Secondary | ICD-10-CM | POA: Diagnosis not present

## 2016-03-07 DIAGNOSIS — H2512 Age-related nuclear cataract, left eye: Secondary | ICD-10-CM | POA: Insufficient documentation

## 2016-03-07 DIAGNOSIS — Z9049 Acquired absence of other specified parts of digestive tract: Secondary | ICD-10-CM | POA: Diagnosis not present

## 2016-03-07 DIAGNOSIS — H269 Unspecified cataract: Secondary | ICD-10-CM | POA: Diagnosis present

## 2016-03-07 DIAGNOSIS — F329 Major depressive disorder, single episode, unspecified: Secondary | ICD-10-CM | POA: Diagnosis not present

## 2016-03-07 DIAGNOSIS — M1990 Unspecified osteoarthritis, unspecified site: Secondary | ICD-10-CM | POA: Insufficient documentation

## 2016-03-07 HISTORY — DX: Headache, unspecified: R51.9

## 2016-03-07 HISTORY — DX: Reserved for inherently not codable concepts without codable children: IMO0001

## 2016-03-07 HISTORY — PX: CATARACT EXTRACTION W/PHACO: SHX586

## 2016-03-07 HISTORY — DX: Sleep apnea, unspecified: G47.30

## 2016-03-07 HISTORY — DX: Headache: R51

## 2016-03-07 LAB — GLUCOSE, CAPILLARY
GLUCOSE-CAPILLARY: 65 mg/dL (ref 65–99)
Glucose-Capillary: 80 mg/dL (ref 65–99)

## 2016-03-07 SURGERY — PHACOEMULSIFICATION, CATARACT, WITH IOL INSERTION
Anesthesia: Monitor Anesthesia Care | Laterality: Left | Wound class: Clean

## 2016-03-07 MED ORDER — FENTANYL CITRATE (PF) 100 MCG/2ML IJ SOLN
INTRAMUSCULAR | Status: DC | PRN
  Administered 2016-03-07: 50 ug via INTRAVENOUS

## 2016-03-07 MED ORDER — NA HYALUR & NA CHOND-NA HYALUR 0.4-0.35 ML IO KIT
PACK | INTRAOCULAR | Status: DC | PRN
  Administered 2016-03-07: 1 mL via INTRAOCULAR

## 2016-03-07 MED ORDER — BSS IO SOLN
INTRAOCULAR | Status: DC | PRN
  Administered 2016-03-07: 61 mL via OPHTHALMIC

## 2016-03-07 MED ORDER — TETRACAINE HCL 0.5 % OP SOLN
1.0000 [drp] | OPHTHALMIC | Status: DC | PRN
  Administered 2016-03-07: 1 [drp] via OPHTHALMIC

## 2016-03-07 MED ORDER — MIDAZOLAM HCL 2 MG/2ML IJ SOLN
INTRAMUSCULAR | Status: DC | PRN
  Administered 2016-03-07: 2 mg via INTRAVENOUS

## 2016-03-07 MED ORDER — ARMC OPHTHALMIC DILATING GEL
1.0000 "application " | OPHTHALMIC | Status: DC | PRN
  Administered 2016-03-07 (×2): 1 via OPHTHALMIC

## 2016-03-07 MED ORDER — BRIMONIDINE TARTRATE 0.2 % OP SOLN
OPHTHALMIC | Status: DC | PRN
Start: 2016-03-07 — End: 2016-03-07
  Administered 2016-03-07: 1 [drp] via OPHTHALMIC

## 2016-03-07 MED ORDER — POVIDONE-IODINE 5 % OP SOLN
1.0000 "application " | OPHTHALMIC | Status: DC | PRN
  Administered 2016-03-07: 1 via OPHTHALMIC

## 2016-03-07 MED ORDER — CEFUROXIME OPHTHALMIC INJECTION 1 MG/0.1 ML
INJECTION | OPHTHALMIC | Status: DC | PRN
  Administered 2016-03-07: .1 mL via INTRACAMERAL

## 2016-03-07 MED ORDER — TIMOLOL MALEATE 0.5 % OP SOLN
OPHTHALMIC | Status: DC | PRN
  Administered 2016-03-07: 1 [drp] via OPHTHALMIC

## 2016-03-07 SURGICAL SUPPLY — 26 items
CANNULA ANT/CHMB 27GA (MISCELLANEOUS) ×3 IMPLANT
CARTRIDGE ABBOTT (MISCELLANEOUS) ×3 IMPLANT
GLOVE SURG LX 7.5 STRW (GLOVE) ×2
GLOVE SURG LX STRL 7.5 STRW (GLOVE) ×1 IMPLANT
GLOVE SURG TRIUMPH 8.0 PF LTX (GLOVE) ×3 IMPLANT
GOWN STRL REUS W/ TWL LRG LVL3 (GOWN DISPOSABLE) ×2 IMPLANT
GOWN STRL REUS W/TWL LRG LVL3 (GOWN DISPOSABLE) ×4
LENS IOL TECNIS ITEC 17.0 (Intraocular Lens) ×3 IMPLANT
MARKER SKIN DUAL TIP RULER LAB (MISCELLANEOUS) ×3 IMPLANT
NDL RETROBULBAR .5 NSTRL (NEEDLE) IMPLANT
NEEDLE FILTER BLUNT 18X 1/2SAF (NEEDLE) ×2
NEEDLE FILTER BLUNT 18X1 1/2 (NEEDLE) ×1 IMPLANT
PACK CATARACT BRASINGTON (MISCELLANEOUS) ×3 IMPLANT
PACK EYE AFTER SURG (MISCELLANEOUS) ×3 IMPLANT
PACK OPTHALMIC (MISCELLANEOUS) ×3 IMPLANT
RING MALYGIN 7.0 (MISCELLANEOUS) IMPLANT
SUT ETHILON 10-0 CS-B-6CS-B-6 (SUTURE)
SUT VICRYL  9 0 (SUTURE)
SUT VICRYL 9 0 (SUTURE) IMPLANT
SUTURE EHLN 10-0 CS-B-6CS-B-6 (SUTURE) IMPLANT
SYR 3ML LL SCALE MARK (SYRINGE) ×3 IMPLANT
SYR 5ML LL (SYRINGE) IMPLANT
SYR TB 1ML LUER SLIP (SYRINGE) ×3 IMPLANT
WATER STERILE IRR 250ML POUR (IV SOLUTION) ×3 IMPLANT
WATER STERILE IRR 500ML POUR (IV SOLUTION) IMPLANT
WIPE NON LINTING 3.25X3.25 (MISCELLANEOUS) ×3 IMPLANT

## 2016-03-07 NOTE — Anesthesia Postprocedure Evaluation (Signed)
Anesthesia Post Note  Patient: Dawn Moore  Procedure(s) Performed: Procedure(s) (LRB): CATARACT EXTRACTION PHACO AND INTRAOCULAR LENS PLACEMENT (IOC) (Left)  Patient location during evaluation: PACU Anesthesia Type: MAC Level of consciousness: awake and alert and oriented Pain management: pain level controlled Vital Signs Assessment: post-procedure vital signs reviewed and stable Respiratory status: spontaneous breathing and nonlabored ventilation Cardiovascular status: stable Postop Assessment: no signs of nausea or vomiting and adequate PO intake Anesthetic complications: no    Estill Batten

## 2016-03-07 NOTE — Transfer of Care (Signed)
Immediate Anesthesia Transfer of Care Note  Patient: Dawn Moore  Procedure(s) Performed: Procedure(s) with comments: CATARACT EXTRACTION PHACO AND INTRAOCULAR LENS PLACEMENT (IOC) (Left) - DIABETIC -oral meds Sleep apnea  Patient Location: PACU  Anesthesia Type: MAC  Level of Consciousness: awake, alert  and patient cooperative  Airway and Oxygen Therapy: Patient Spontanous Breathing and Patient connected to supplemental oxygen  Post-op Assessment: Post-op Vital signs reviewed, Patient's Cardiovascular Status Stable, Respiratory Function Stable, Patent Airway and No signs of Nausea or vomiting  Post-op Vital Signs: Reviewed and stable  Complications: No apparent anesthesia complications

## 2016-03-07 NOTE — Anesthesia Procedure Notes (Signed)
Procedure Name: MAC Performed by: Labradford Schnitker Pre-anesthesia Checklist: Patient identified, Emergency Drugs available, Suction available, Timeout performed and Patient being monitored Patient Re-evaluated:Patient Re-evaluated prior to inductionOxygen Delivery Method: Nasal cannula Placement Confirmation: positive ETCO2     

## 2016-03-07 NOTE — Op Note (Signed)
OPERATIVE NOTE  Dawn Moore [id number] 03/07/2016   PREOPERATIVE DIAGNOSIS:  Nuclear sclerotic cataract left eye. H25.12   POSTOPERATIVE DIAGNOSIS:    Nuclear sclerotic cataract left eye.     PROCEDURE:  Phacoemusification with posterior chamber intraocular lens placement of the left eye   LENS:   Implant Name Type Inv. Item Serial No. Manufacturer Lot No. LRB No. Used  technis aspheric iol pre-loaded system     [id number] [facility]   Left 1     PCB00 17.0 D PCIOL   ULTRASOUND TIME: 14  % of 0 minutes 57 seconds, CDE 8.1  SURGEON:  Wyonia Hough, MD   ANESTHESIA:  Topical with tetracaine drops and 2% Xylocaine jelly.   COMPLICATIONS:  None.   DESCRIPTION OF PROCEDURE:  The patient was identified in the holding room and transported to the operating room and placed in the supine position under the operating microscope.  The left eye was identified as the operative eye and it was prepped and draped in the usual sterile ophthalmic fashion.   A 1 millimeter clear-corneal paracentesis was made at the 1:30 position.  The anterior chamber was filled with Viscoat viscoelastic.  A 2.4 millimeter keratome was used to make a near-clear corneal incision at the 10:30 position.  .  A curvilinear capsulorrhexis was made with a cystotome and capsulorrhexis forceps.  Balanced salt solution was used to hydrodissect and hydrodelineate the nucleus.   Phacoemulsification was then used in stop and chop fashion to remove the lens nucleus and epinucleus.  The remaining cortex was then removed using the irrigation and aspiration handpiece. Provisc was then placed into the capsular bag to distend it for lens placement.  A lens was then injected into the capsular bag.  The remaining viscoelastic was aspirated.   Wounds were hydrated with balanced salt solution.  The anterior chamber was inflated to a physiologic pressure with balanced salt solution.  No wound leaks were noted. Cefuroxime 0.1 ml of a  10mg /ml solution was injected into the anterior chamber for a dose of 1 mg of intracameral antibiotic at the completion of the case.   Timolol and Brimonidine drops were applied to the eye.  The patient was taken to the recovery room in stable condition without complications of anesthesia or surgery.  [name] 03/07/2016, 8:05 AM

## 2016-03-07 NOTE — H&P (Signed)
  The History and Physical notes are on paper, have been signed, and are to be scanned. The patient remains stable and unchanged from the H&P.   Previous H&P reviewed, patient examined, and there are no changes.  Kimsey Demaree 03/07/2016 7:33 AM

## 2016-03-07 NOTE — Anesthesia Preprocedure Evaluation (Signed)
Anesthesia Evaluation  Patient identified by MRN, date of birth, ID band Patient awake    Reviewed: Allergy & Precautions, NPO status , Patient's Chart, lab work & pertinent test results, reviewed documented beta blocker date and time   Airway Mallampati: II  TM Distance: >3 FB     Dental no notable dental hx.    Pulmonary shortness of breath, asthma , sleep apnea ,    Pulmonary exam normal        Cardiovascular Normal cardiovascular exam     Neuro/Psych PSYCHIATRIC DISORDERS Anxiety Depression    GI/Hepatic GERD  ,  Endo/Other  diabetes, Type 2, Oral Hypoglycemic Agents  Renal/GU Renal disease     Musculoskeletal  (+) Arthritis , Osteoarthritis,    Abdominal   Peds  Hematology   Anesthesia Other Findings   Reproductive/Obstetrics                             Anesthesia Physical Anesthesia Plan  ASA: III  Anesthesia Plan: MAC   Post-op Pain Management:    Induction: Intravenous  Airway Management Planned:   Additional Equipment:   Intra-op Plan:   Post-operative Plan:   Informed Consent: I have reviewed the patients History and Physical, chart, labs and discussed the procedure including the risks, benefits and alternatives for the proposed anesthesia with the patient or authorized representative who has indicated his/her understanding and acceptance.     Plan Discussed with: CRNA  Anesthesia Plan Comments:         Anesthesia Quick Evaluation

## 2016-03-08 ENCOUNTER — Telehealth: Payer: Self-pay | Admitting: Family Medicine

## 2016-03-08 ENCOUNTER — Encounter: Payer: Self-pay | Admitting: Ophthalmology

## 2016-03-08 NOTE — Telephone Encounter (Signed)
Patient's daughter notified.

## 2016-03-08 NOTE — Telephone Encounter (Signed)
She can cut it in 1/2

## 2016-03-08 NOTE — Telephone Encounter (Signed)
pts daughter called and stated the the seroquel it makes the pt a little more relaxed than she feels she should be. Dawn Moore stated that her mom was getting sleep but she can go to drink something and miss her entire mouth and she feels like maybe it is just too strong and they would like to know about possibly cutting it in half.

## 2016-03-14 DIAGNOSIS — M8588 Other specified disorders of bone density and structure, other site: Secondary | ICD-10-CM | POA: Diagnosis not present

## 2016-03-14 DIAGNOSIS — M85862 Other specified disorders of bone density and structure, left lower leg: Secondary | ICD-10-CM | POA: Diagnosis not present

## 2016-03-14 DIAGNOSIS — M85852 Other specified disorders of bone density and structure, left thigh: Secondary | ICD-10-CM | POA: Diagnosis not present

## 2016-03-14 DIAGNOSIS — Z1231 Encounter for screening mammogram for malignant neoplasm of breast: Secondary | ICD-10-CM | POA: Diagnosis not present

## 2016-03-16 ENCOUNTER — Other Ambulatory Visit: Payer: Self-pay | Admitting: Family Medicine

## 2016-03-16 NOTE — Telephone Encounter (Signed)
I thought she wasn't taking this, and she should have 90 days- can you check on this?

## 2016-03-16 NOTE — Telephone Encounter (Signed)
Patient does not need a refill yet.

## 2016-03-20 ENCOUNTER — Other Ambulatory Visit: Payer: Self-pay | Admitting: Family Medicine

## 2016-03-20 MED ORDER — ALPRAZOLAM 1 MG PO TABS
1.0000 mg | ORAL_TABLET | Freq: Three times a day (TID) | ORAL | Status: DC | PRN
Start: 2016-03-20 — End: 2016-04-16

## 2016-03-22 DIAGNOSIS — H2511 Age-related nuclear cataract, right eye: Secondary | ICD-10-CM | POA: Diagnosis not present

## 2016-03-23 ENCOUNTER — Encounter: Payer: Self-pay | Admitting: *Deleted

## 2016-03-23 ENCOUNTER — Ambulatory Visit (INDEPENDENT_AMBULATORY_CARE_PROVIDER_SITE_OTHER): Payer: Commercial Managed Care - HMO | Admitting: Family Medicine

## 2016-03-23 ENCOUNTER — Ambulatory Visit: Payer: Commercial Managed Care - HMO | Admitting: Family Medicine

## 2016-03-23 ENCOUNTER — Encounter: Payer: Self-pay | Admitting: Family Medicine

## 2016-03-23 VITALS — BP 157/86 | HR 76 | Temp 98.6°F | Ht 66.0 in | Wt 225.2 lb

## 2016-03-23 DIAGNOSIS — N182 Chronic kidney disease, stage 2 (mild): Secondary | ICD-10-CM | POA: Diagnosis not present

## 2016-03-23 DIAGNOSIS — E785 Hyperlipidemia, unspecified: Secondary | ICD-10-CM | POA: Diagnosis not present

## 2016-03-23 DIAGNOSIS — F329 Major depressive disorder, single episode, unspecified: Secondary | ICD-10-CM

## 2016-03-23 DIAGNOSIS — E118 Type 2 diabetes mellitus with unspecified complications: Secondary | ICD-10-CM | POA: Diagnosis not present

## 2016-03-23 DIAGNOSIS — F32A Depression, unspecified: Secondary | ICD-10-CM

## 2016-03-23 DIAGNOSIS — E1122 Type 2 diabetes mellitus with diabetic chronic kidney disease: Secondary | ICD-10-CM

## 2016-03-23 LAB — LP+ALT+AST PICCOLO, WAIVED
ALT (SGPT) PICCOLO, WAIVED: 29 U/L (ref 10–47)
AST (SGOT) PICCOLO, WAIVED: 42 U/L — AB (ref 11–38)
CHOL/HDL RATIO PICCOLO,WAIVE: 3.1 mg/dL
CHOLESTEROL PICCOLO, WAIVED: 142 mg/dL (ref <200)
HDL Chol Piccolo, Waived: 46 mg/dL — ABNORMAL LOW (ref >59)
LDL Chol Calc Piccolo Waived: 64 mg/dL (ref <100)
Triglycerides Piccolo,Waived: 158 mg/dL — ABNORMAL HIGH (ref <150)
VLDL Chol Calc Piccolo,Waive: 32 mg/dL — ABNORMAL HIGH (ref <30)

## 2016-03-23 LAB — BAYER DCA HB A1C WAIVED: HB A1C: 7.1 % — AB (ref <7.0)

## 2016-03-23 MED ORDER — QUETIAPINE FUMARATE 25 MG PO TABS: 25.0000 mg | ORAL_TABLET | Freq: Every day | ORAL | Status: DC

## 2016-03-23 NOTE — Assessment & Plan Note (Signed)
A1c back up to 7.1. Work on diet. Will hold on changing medicine. Recheck 3 months.

## 2016-03-23 NOTE — Assessment & Plan Note (Signed)
Controlled on current regimen. Continue current regimen. Continue to monitor.

## 2016-03-23 NOTE — Assessment & Plan Note (Signed)
Stable, but not doing great. Social issues going on. Will continue current regimen and continue to monitor. Recheck 3 months, call with any changes.

## 2016-03-23 NOTE — Progress Notes (Signed)
BP 157/86 mmHg  Pulse 76  Temp(Src) 98.6 F (37 C)  Ht 5\' 6"  (1.676 m)  Wt 225 lb 3.2 oz (102.15 kg)  BMI 36.37 kg/m2  SpO2 95%   Subjective:    Patient ID: Dawn Moore, female    DOB: 03/20/1941, 75 y.o.   MRN: [id number]  HPI: Dawn Moore is a 76 y.o. female  Chief Complaint  Patient presents with  . Follow-up  . Depression   DEPRESSION Mood status: stable Satisfied with current treatment?: no Symptom severity: severe  Duration of current treatment : chronic Side effects: no Medication compliance: excellent compliance Psychotherapy/counseling: no  Depressed mood: yes Anxious mood: yes Anhedonia: no Significant weight loss or gain: no Insomnia: no  Fatigue: yes Feelings of worthlessness or guilt: yes Impaired concentration/indecisiveness: yes Suicidal ideations: no Hopelessness: yes Crying spells: yes Depression screen [facility] 2/9 03/23/2016 02/27/2016 01/30/2016 09/23/2015 08/12/2015  Decreased Interest 3 2 3  - 2  Down, Depressed, Hopeless 3 2 3  - 2  PHQ - 2 Score 6 4 6  - 4  Altered sleeping 3 2 3 1 3   Tired, decreased energy 3 2 3 1 2   Change in appetite 3 3 3  - 2  Feeling bad or failure about yourself  3 2 2  - 2  Trouble concentrating 3 2 3  - 2  Moving slowly or fidgety/restless 0 2 2 - 1  Suicidal thoughts 0 2 2 - 1  PHQ-9 Score 21 19 24  - 17  Difficult doing work/chores Very difficult Very difficult Extremely dIfficult - Very difficult   DIABETES Hypoglycemic episodes:no Polydipsia/polyuria: no Visual disturbance: yes Chest pain: no Paresthesias: no Glucose Monitoring: yes Taking Insulin?: no Blood Pressure Monitoring: not checking Retinal Examination: Up to Date Foot Exam: Up to Date Diabetic Education: Completed Pneumovax: Up to Date Influenza: Up to Date Aspirin: yes   HYPERLIPIDEMIA Hyperlipidemia status: excellent compliance Satisfied with current treatment?  yes Side effects:  no Medication compliance: excellent compliance Supplements:  none Aspirin:  no Chest pain:  no Coronary artery disease:  no Family history CAD:  yes  Relevant past medical, surgical, family and social history reviewed and updated as indicated. Interim medical history since our last visit reviewed. Allergies and medications reviewed and updated.  Review of Systems  Constitutional: Negative.   Respiratory: Negative.   Cardiovascular: Negative.   Gastrointestinal: Negative.   Psychiatric/Behavioral: Positive for sleep disturbance and dysphoric mood. Negative for suicidal ideas, hallucinations, behavioral problems, confusion, self-injury, decreased concentration and agitation. The patient is nervous/anxious. The patient is not hyperactive.     Per HPI unless specifically indicated above     Objective:    BP 157/86 mmHg  Pulse 76  Temp(Src) 98.6 F (37 C)  Ht 5\' 6"  (1.676 m)  Wt 225 lb 3.2 oz (102.15 kg)  BMI 36.37 kg/m2  SpO2 95%  Wt Readings from Last 3 Encounters:  03/23/16 225 lb 3.2 oz (102.15 kg)  03/07/16 227 lb (102.967 kg)  02/27/16 225 lb (102.059 kg)    Physical Exam  Constitutional: She is oriented to person, place, and time. She appears well-developed and well-nourished. No distress.  HENT:  Head: Normocephalic and atraumatic.  Right Ear: Hearing and external ear normal.  Left Ear: Hearing and external ear normal.  Nose: Nose normal.  Eyes: Conjunctivae, EOM and lids are normal. Pupils are equal, round, and reactive to light. Right eye exhibits no discharge. Left eye exhibits no discharge. No scleral icterus.  Neck: Normal range of motion. Neck  supple. No JVD present. No tracheal deviation present. No thyromegaly present.  Cardiovascular: Normal rate, regular rhythm, normal heart sounds and intact distal pulses.  Exam reveals no gallop and no friction rub.   No murmur heard. Pulmonary/Chest: Effort normal and breath sounds normal. No stridor. No respiratory distress. She has no wheezes. She has no rales. She exhibits no  tenderness.  Musculoskeletal: Normal range of motion.  Lymphadenopathy:    She has no cervical adenopathy.  Neurological: She is alert and oriented to person, place, and time.  Skin: Skin is warm, dry and intact. No rash noted. She is not diaphoretic. No erythema. No pallor.  Psychiatric: She has a normal mood and affect. Her speech is normal and behavior is normal. Judgment and thought content normal. Cognition and memory are normal.  Nursing note and vitals reviewed.   Results for orders placed or performed during the hospital encounter of 03/07/16  Glucose, capillary  Result Value Ref Range   Glucose-Capillary 65 65 - 99 mg/dL  Glucose, capillary  Result Value Ref Range   Glucose-Capillary 80 65 - 99 mg/dL      Assessment & Plan:   Problem List Items Addressed This Visit      Endocrine   Diabetes mellitus with renal manifestations, controlled ([place])    A1c back up to 7.1. Work on diet. Will hold on changing medicine. Recheck 3 months.         Other   Depression - Primary    Stable, but not doing great. Social issues going on. Will continue current regimen and continue to monitor. Recheck 3 months, call with any changes.       Hyperlipidemia    Controlled on current regimen. Continue current regimen. Continue to monitor.       Relevant Orders   LP+ALT+AST Piccolo, Waived   Comprehensive metabolic panel    Other Visit Diagnoses    Type 2 diabetes mellitus with complication, without long-term current use of insulin ([place])        Relevant Orders    Bayer DCA Hb A1c Waived    LP+ALT+AST Piccolo, Waived    Comprehensive metabolic panel        Follow up plan: Return in about 3 months (around 06/23/2016) for Depression/DM visit.

## 2016-03-24 LAB — COMPREHENSIVE METABOLIC PANEL
A/G RATIO: 1.4 (ref 1.2–2.2)
ALK PHOS: 71 IU/L (ref 39–117)
ALT: 26 IU/L (ref 0–32)
AST: 43 IU/L — AB (ref 0–40)
Albumin: 3.9 g/dL (ref 3.5–4.8)
BILIRUBIN TOTAL: 0.7 mg/dL (ref 0.0–1.2)
BUN/Creatinine Ratio: 12 (ref 11–26)
BUN: 13 mg/dL (ref 8–27)
CALCIUM: 9.2 mg/dL (ref 8.7–10.3)
CHLORIDE: 101 mmol/L (ref 96–106)
CO2: 25 mmol/L (ref 18–29)
Creatinine, Ser: 1.05 mg/dL — ABNORMAL HIGH (ref 0.57–1.00)
GFR calc Af Amer: 61 mL/min/{1.73_m2} (ref >59)
GFR, EST NON AFRICAN AMERICAN: 53 mL/min/{1.73_m2} — AB (ref >59)
GLOBULIN, TOTAL: 2.7 g/dL (ref 1.5–4.5)
Glucose: 131 mg/dL — ABNORMAL HIGH (ref 65–99)
POTASSIUM: 5.1 mmol/L (ref 3.5–5.2)
SODIUM: 141 mmol/L (ref 134–144)
Total Protein: 6.6 g/dL (ref 6.0–8.5)

## 2016-03-27 ENCOUNTER — Telehealth: Payer: Self-pay | Admitting: Family Medicine

## 2016-03-27 NOTE — Telephone Encounter (Signed)
Please let her know that her labs look good, but she's a little dehydrated. Work on the fluid intake! Thanks!

## 2016-03-27 NOTE — Discharge Instructions (Signed)

## 2016-03-27 NOTE — Telephone Encounter (Signed)
Left voicemail for patient with her lab results

## 2016-03-28 ENCOUNTER — Ambulatory Visit: Payer: Commercial Managed Care - HMO | Admitting: Student in an Organized Health Care Education/Training Program

## 2016-03-28 ENCOUNTER — Ambulatory Visit
Admission: RE | Admit: 2016-03-28 | Discharge: 2016-03-28 | Disposition: A | Discharge status: Home / Self Care | Payer: Commercial Managed Care - HMO | Source: Ambulatory Visit | Attending: Ophthalmology | Admitting: Ophthalmology

## 2016-03-28 ENCOUNTER — Encounter
Admission: RE | Disposition: A | Discharge status: Home / Self Care | Payer: Self-pay | Source: Ambulatory Visit | Attending: Ophthalmology

## 2016-03-28 DIAGNOSIS — H269 Unspecified cataract: Secondary | ICD-10-CM | POA: Diagnosis present

## 2016-03-28 DIAGNOSIS — E7800 Pure hypercholesterolemia, unspecified: Secondary | ICD-10-CM | POA: Diagnosis not present

## 2016-03-28 DIAGNOSIS — Z9889 Other specified postprocedural states: Secondary | ICD-10-CM | POA: Diagnosis not present

## 2016-03-28 DIAGNOSIS — Z9049 Acquired absence of other specified parts of digestive tract: Secondary | ICD-10-CM | POA: Diagnosis not present

## 2016-03-28 DIAGNOSIS — Z9842 Cataract extraction status, left eye: Secondary | ICD-10-CM | POA: Insufficient documentation

## 2016-03-28 DIAGNOSIS — Z85828 Personal history of other malignant neoplasm of skin: Secondary | ICD-10-CM | POA: Diagnosis not present

## 2016-03-28 DIAGNOSIS — F329 Major depressive disorder, single episode, unspecified: Secondary | ICD-10-CM | POA: Insufficient documentation

## 2016-03-28 DIAGNOSIS — H2511 Age-related nuclear cataract, right eye: Secondary | ICD-10-CM | POA: Insufficient documentation

## 2016-03-28 DIAGNOSIS — G4730 Sleep apnea, unspecified: Secondary | ICD-10-CM | POA: Insufficient documentation

## 2016-03-28 DIAGNOSIS — E119 Type 2 diabetes mellitus without complications: Secondary | ICD-10-CM | POA: Insufficient documentation

## 2016-03-28 DIAGNOSIS — M1990 Unspecified osteoarthritis, unspecified site: Secondary | ICD-10-CM | POA: Diagnosis not present

## 2016-03-28 DIAGNOSIS — Z8701 Personal history of pneumonia (recurrent): Secondary | ICD-10-CM | POA: Diagnosis not present

## 2016-03-28 HISTORY — PX: CATARACT EXTRACTION W/PHACO: SHX586

## 2016-03-28 LAB — GLUCOSE, CAPILLARY
GLUCOSE-CAPILLARY: 90 mg/dL (ref 65–99)
Glucose-Capillary: 86 mg/dL (ref 65–99)

## 2016-03-28 SURGERY — PHACOEMULSIFICATION, CATARACT, WITH IOL INSERTION
Anesthesia: Monitor Anesthesia Care | Laterality: Right | Wound class: Clean

## 2016-03-28 MED ORDER — TETRACAINE HCL 0.5 % OP SOLN
1.0000 [drp] | OPHTHALMIC | Status: DC | PRN
  Administered 2016-03-28: 1 [drp] via OPHTHALMIC

## 2016-03-28 MED ORDER — POVIDONE-IODINE 5 % OP SOLN
1.0000 "application " | OPHTHALMIC | Status: DC | PRN
  Administered 2016-03-28: 1 via OPHTHALMIC

## 2016-03-28 MED ORDER — BRIMONIDINE TARTRATE 0.2 % OP SOLN
OPHTHALMIC | Status: DC | PRN
  Administered 2016-03-28: 1 [drp] via OPHTHALMIC

## 2016-03-28 MED ORDER — MIDAZOLAM HCL 2 MG/2ML IJ SOLN
INTRAMUSCULAR | Status: DC | PRN
  Administered 2016-03-28: 2 mg via INTRAVENOUS

## 2016-03-28 MED ORDER — TIMOLOL MALEATE 0.5 % OP SOLN
OPHTHALMIC | Status: DC | PRN
  Administered 2016-03-28: 1 [drp] via OPHTHALMIC

## 2016-03-28 MED ORDER — CEFUROXIME OPHTHALMIC INJECTION 1 MG/0.1 ML
INJECTION | OPHTHALMIC | Status: DC | PRN
  Administered 2016-03-28: 0.1 mL via OPHTHALMIC

## 2016-03-28 MED ORDER — ARMC OPHTHALMIC DILATING GEL
1.0000 | OPHTHALMIC | Status: DC | PRN
Start: 2016-03-28 — End: 2016-03-28
  Administered 2016-03-28 (×2): 1 via OPHTHALMIC

## 2016-03-28 MED ORDER — NA HYALUR & NA CHOND-NA HYALUR 0.4-0.35 ML IO KIT
PACK | INTRAOCULAR | Status: DC | PRN
  Administered 2016-03-28: 1 mL via INTRAOCULAR

## 2016-03-28 MED ORDER — TETRACAINE HCL 0.5 % OP SOLN
OPHTHALMIC | Status: DC | PRN
  Administered 2016-03-28: 2 [drp] via OPHTHALMIC

## 2016-03-28 MED ORDER — EPINEPHRINE HCL 1 MG/ML IJ SOLN
INTRAMUSCULAR | Status: DC | PRN
  Administered 2016-03-28: 52 mL via OPHTHALMIC

## 2016-03-28 MED ORDER — FENTANYL CITRATE (PF) 100 MCG/2ML IJ SOLN
INTRAMUSCULAR | Status: DC | PRN
  Administered 2016-03-28: 50 ug via INTRAVENOUS

## 2016-03-28 SURGICAL SUPPLY — 26 items
CANNULA ANT/CHMB 27GA (MISCELLANEOUS) ×3 IMPLANT
CARTRIDGE ABBOTT (MISCELLANEOUS) ×3 IMPLANT
GLOVE SURG LX 7.5 STRW (GLOVE) ×2
GLOVE SURG LX STRL 7.5 STRW (GLOVE) ×1 IMPLANT
GLOVE SURG TRIUMPH 8.0 PF LTX (GLOVE) ×3 IMPLANT
GOWN STRL REUS W/ TWL LRG LVL3 (GOWN DISPOSABLE) ×2 IMPLANT
GOWN STRL REUS W/TWL LRG LVL3 (GOWN DISPOSABLE) ×4
LENS IOL TECNIS ITEC 16.5 (Intraocular Lens) ×3 IMPLANT
MARKER SKIN DUAL TIP RULER LAB (MISCELLANEOUS) ×3 IMPLANT
NDL RETROBULBAR .5 NSTRL (NEEDLE) IMPLANT
NEEDLE FILTER BLUNT 18X 1/2SAF (NEEDLE) ×2
NEEDLE FILTER BLUNT 18X1 1/2 (NEEDLE) ×1 IMPLANT
PACK CATARACT BRASINGTON (MISCELLANEOUS) ×3 IMPLANT
PACK EYE AFTER SURG (MISCELLANEOUS) ×3 IMPLANT
PACK OPTHALMIC (MISCELLANEOUS) ×3 IMPLANT
RING MALYGIN 7.0 (MISCELLANEOUS) IMPLANT
SUT ETHILON 10-0 CS-B-6CS-B-6 (SUTURE)
SUT VICRYL  9 0 (SUTURE)
SUT VICRYL 9 0 (SUTURE) IMPLANT
SUTURE EHLN 10-0 CS-B-6CS-B-6 (SUTURE) IMPLANT
SYR 3ML LL SCALE MARK (SYRINGE) ×3 IMPLANT
SYR 5ML LL (SYRINGE) IMPLANT
SYR TB 1ML LUER SLIP (SYRINGE) ×3 IMPLANT
WATER STERILE IRR 250ML POUR (IV SOLUTION) ×3 IMPLANT
WATER STERILE IRR 500ML POUR (IV SOLUTION) IMPLANT
WIPE NON LINTING 3.25X3.25 (MISCELLANEOUS) ×3 IMPLANT

## 2016-03-28 NOTE — Transfer of Care (Signed)
Immediate Anesthesia Transfer of Care Note  Patient: Dawn Moore  Procedure(s) Performed: Procedure(s) with comments: CATARACT EXTRACTION PHACO AND INTRAOCULAR LENS PLACEMENT (IOC) (Right) - DIABETIC - oral meds  Patient Location: PACU  Anesthesia Type: MAC  Level of Consciousness: awake, alert  and patient cooperative  Airway and Oxygen Therapy: Patient Spontanous Breathing and Patient connected to supplemental oxygen  Post-op Assessment: Post-op Vital signs reviewed, Patient's Cardiovascular Status Stable, Respiratory Function Stable, Patent Airway and No signs of Nausea or vomiting  Post-op Vital Signs: Reviewed and stable  Complications: No apparent anesthesia complications

## 2016-03-28 NOTE — Op Note (Signed)
LOCATION:  [place]   PREOPERATIVE DIAGNOSIS:    Nuclear sclerotic cataract right eye. H25.11   POSTOPERATIVE DIAGNOSIS:  Nuclear sclerotic cataract right eye.     PROCEDURE:  Phacoemusification with posterior chamber intraocular lens placement of the right eye   LENS:   Implant Name Type Inv. Item Serial No. Manufacturer Lot No. LRB No. Used  PCB00 16.5 D lens     [id number] [facility]   Right 1       ULTRASOUND TIME: 14 % of 0 minutes, 53 seconds.  CDE 7.7   SURGEON:  Wyonia Hough, MD   ANESTHESIA:  Topical with tetracaine drops and 2% Xylocaine jelly.   COMPLICATIONS:  None.   DESCRIPTION OF PROCEDURE:  The patient was identified in the holding room and transported to the operating room and placed in the supine position under the operating microscope.  The right eye was identified as the operative eye and it was prepped and draped in the usual sterile ophthalmic fashion.   A 1 millimeter clear-corneal paracentesis was made at the 12:00 position.  The anterior chamber was filled with Viscoat viscoelastic.  A 2.4 millimeter keratome was used to make a near-clear corneal incision at the 9:00 position.  A curvilinear capsulorrhexis was made with a cystotome and capsulorrhexis forceps.  Balanced salt solution was used to hydrodissect and hydrodelineate the nucleus.   Phacoemulsification was then used in stop and chop fashion to remove the lens nucleus and epinucleus.  The remaining cortex was then removed using the irrigation and aspiration handpiece. Provisc was then placed into the capsular bag to distend it for lens placement.  A lens was then injected into the capsular bag.  The remaining viscoelastic was aspirated.   Wounds were hydrated with balanced salt solution.  The anterior chamber was inflated to a physiologic pressure with balanced salt solution.  No wound leaks were noted. Cefuroxime 0.1 ml of a 10mg /ml solution was injected into the anterior chamber for a  dose of 1 mg of intracameral antibiotic at the completion of the case.   Timolol and Brimonidine drops were applied to the eye.  The patient was taken to the recovery room in stable condition without complications of anesthesia or surgery.   Lamarius Dirr 03/28/2016, 8:34 AM

## 2016-03-28 NOTE — Anesthesia Preprocedure Evaluation (Addendum)
Anesthesia Evaluation  Patient identified by MRN, date of birth, ID band Patient awake    Reviewed: Allergy & Precautions, NPO status , Patient's Chart, lab work & pertinent test results, reviewed documented beta blocker date and time   Airway Mallampati: II  TM Distance: >3 FB Neck ROM: Full    Dental no notable dental hx.    Pulmonary shortness of breath, asthma , sleep apnea (no CPAP) ,    Pulmonary exam normal        Cardiovascular Normal cardiovascular exam     Neuro/Psych  Headaches, PSYCHIATRIC DISORDERS Anxiety Depression    GI/Hepatic Neg liver ROS, GERD  Medicated and Controlled,  Endo/Other  diabetes, Well Controlled, Type 2, Oral Hypoglycemic Agents  Renal/GU CRFRenal disease     Musculoskeletal  (+) Arthritis , Osteoarthritis,    Abdominal   Peds  Hematology negative hematology ROS (+)   Anesthesia Other Findings   Reproductive/Obstetrics                            Anesthesia Physical Anesthesia Plan  ASA: II  Anesthesia Plan: MAC   Post-op Pain Management:    Induction: Intravenous  Airway Management Planned:   Additional Equipment:   Intra-op Plan:   Post-operative Plan:   Informed Consent: I have reviewed the patients History and Physical, chart, labs and discussed the procedure including the risks, benefits and alternatives for the proposed anesthesia with the patient or authorized representative who has indicated his/her understanding and acceptance.     Plan Discussed with: CRNA  Anesthesia Plan Comments:         Anesthesia Quick Evaluation

## 2016-03-28 NOTE — H&P (Signed)
  The History and Physical notes are on paper, have been signed, and are to be scanned. The patient remains stable and unchanged from the H&P.   Previous H&P reviewed, patient examined, and there are no changes.  Dawn Moore 03/28/2016 7:44 AM

## 2016-03-28 NOTE — Anesthesia Procedure Notes (Signed)
Procedure Name: MAC Performed by: Ruford Dudzinski Pre-anesthesia Checklist: Patient identified, Emergency Drugs available, Suction available, Timeout performed and Patient being monitored Patient Re-evaluated:Patient Re-evaluated prior to inductionOxygen Delivery Method: Nasal cannula Placement Confirmation: positive ETCO2       

## 2016-03-28 NOTE — Anesthesia Postprocedure Evaluation (Signed)
Anesthesia Post Note  Patient: Dawn Moore  Procedure(s) Performed: Procedure(s) (LRB): CATARACT EXTRACTION PHACO AND INTRAOCULAR LENS PLACEMENT (IOC) (Right)  Patient location during evaluation: PACU Anesthesia Type: MAC Level of consciousness: awake and alert and oriented Pain management: pain level controlled Vital Signs Assessment: post-procedure vital signs reviewed and stable Respiratory status: spontaneous breathing and nonlabored ventilation Cardiovascular status: stable Postop Assessment: no signs of nausea or vomiting and adequate PO intake Anesthetic complications: no    Estill Batten

## 2016-03-29 ENCOUNTER — Encounter: Payer: Self-pay | Admitting: Ophthalmology

## 2016-04-04 DIAGNOSIS — D485 Neoplasm of uncertain behavior of skin: Secondary | ICD-10-CM | POA: Diagnosis not present

## 2016-04-04 DIAGNOSIS — D225 Melanocytic nevi of trunk: Secondary | ICD-10-CM | POA: Diagnosis not present

## 2016-04-04 DIAGNOSIS — L820 Inflamed seborrheic keratosis: Secondary | ICD-10-CM | POA: Diagnosis not present

## 2016-04-04 DIAGNOSIS — Z85828 Personal history of other malignant neoplasm of skin: Secondary | ICD-10-CM | POA: Diagnosis not present

## 2016-04-04 DIAGNOSIS — Z85820 Personal history of malignant melanoma of skin: Secondary | ICD-10-CM | POA: Diagnosis not present

## 2016-04-04 DIAGNOSIS — D2261 Melanocytic nevi of right upper limb, including shoulder: Secondary | ICD-10-CM | POA: Diagnosis not present

## 2016-04-04 DIAGNOSIS — L570 Actinic keratosis: Secondary | ICD-10-CM | POA: Diagnosis not present

## 2016-04-16 ENCOUNTER — Other Ambulatory Visit: Payer: Self-pay | Admitting: Family Medicine

## 2016-04-16 MED ORDER — ALPRAZOLAM 1 MG PO TABS: 1.0000 mg | ORAL_TABLET | Freq: Three times a day (TID) | ORAL | Status: DC | PRN

## 2016-04-23 ENCOUNTER — Telehealth: Payer: Self-pay | Admitting: Family Medicine

## 2016-04-23 DIAGNOSIS — N390 Urinary tract infection, site not specified: Secondary | ICD-10-CM | POA: Diagnosis not present

## 2016-04-23 DIAGNOSIS — E119 Type 2 diabetes mellitus without complications: Secondary | ICD-10-CM | POA: Diagnosis not present

## 2016-04-23 DIAGNOSIS — R29818 Other symptoms and signs involving the nervous system: Secondary | ICD-10-CM | POA: Diagnosis not present

## 2016-04-23 DIAGNOSIS — F29 Unspecified psychosis not due to a substance or known physiological condition: Secondary | ICD-10-CM | POA: Diagnosis not present

## 2016-04-23 DIAGNOSIS — G9340 Encephalopathy, unspecified: Secondary | ICD-10-CM | POA: Diagnosis not present

## 2016-04-23 DIAGNOSIS — K219 Gastro-esophageal reflux disease without esophagitis: Secondary | ICD-10-CM | POA: Diagnosis not present

## 2016-04-23 DIAGNOSIS — E785 Hyperlipidemia, unspecified: Secondary | ICD-10-CM | POA: Diagnosis not present

## 2016-04-23 DIAGNOSIS — D696 Thrombocytopenia, unspecified: Secondary | ICD-10-CM | POA: Diagnosis not present

## 2016-04-23 DIAGNOSIS — F419 Anxiety disorder, unspecified: Secondary | ICD-10-CM | POA: Diagnosis not present

## 2016-04-23 DIAGNOSIS — F329 Major depressive disorder, single episode, unspecified: Secondary | ICD-10-CM | POA: Diagnosis not present

## 2016-04-23 DIAGNOSIS — G459 Transient cerebral ischemic attack, unspecified: Secondary | ICD-10-CM | POA: Diagnosis not present

## 2016-04-23 DIAGNOSIS — G894 Chronic pain syndrome: Secondary | ICD-10-CM | POA: Diagnosis not present

## 2016-04-23 DIAGNOSIS — R51 Headache: Secondary | ICD-10-CM | POA: Diagnosis not present

## 2016-04-23 DIAGNOSIS — I10 Essential (primary) hypertension: Secondary | ICD-10-CM | POA: Diagnosis not present

## 2016-04-23 NOTE — Telephone Encounter (Signed)
Patient called wanting to talk to [place] about her medication refills. Please return patient call, thanks. [phone]

## 2016-04-23 NOTE — Telephone Encounter (Signed)
Dawn Moore called, Dawn Moore was unable to make a sentence last night and today she is some what better. Now she is unable to put sentences together again. Verbal order from [place] for patient to go to ER.  Requesting that a medication list to be emailed.

## 2016-04-23 NOTE — Telephone Encounter (Signed)
Called no answer, left message for patient to return my call.

## 2016-04-25 ENCOUNTER — Telehealth: Payer: Self-pay | Admitting: Family Medicine

## 2016-04-25 DIAGNOSIS — R404 Transient alteration of awareness: Secondary | ICD-10-CM

## 2016-04-25 NOTE — Telephone Encounter (Signed)
Pt's daughter called stated her mom had to go to the ER while out of town, they thought she might have had a mini stroke or UTI. She stated they did a CT but not an MRI. Pt's daughter stated they starting treating her mom for a UTI and things got better. She would like to speak with Dr. Wynetta Emery if at all possible. Thanks.

## 2016-04-25 NOTE — Telephone Encounter (Signed)
Notes requested

## 2016-04-25 NOTE — Telephone Encounter (Signed)
Went to the ER and was in the hospital for a couple of days. Didn't have an MRI scheduled. Would like to have one scheduled as an outpatient. Will get one scheduled as an outpatient. Will try to get her records from [facility] in [place] prior to her appointment on Monday. They are in [place] right now and will not be back until Sunday.   Amy- they can't sign a release as they are out of town, can we call and see if we can get the hospital records before Monday? Thank you so much!!

## 2016-04-30 ENCOUNTER — Telehealth: Payer: Self-pay | Admitting: Family Medicine

## 2016-04-30 NOTE — Telephone Encounter (Signed)
She can take 2 of her xanax

## 2016-04-30 NOTE — Telephone Encounter (Signed)
Routing to provider  

## 2016-04-30 NOTE — Telephone Encounter (Signed)
Pt's daughter called stated her mom gets sick when she goes for an MRI, wants to know if something can be called in to calm her nerves. Pharm is [facility]. Please call pt's with any questions @ [phone]. Thanks.

## 2016-04-30 NOTE — Telephone Encounter (Signed)
Patient's mother notified.

## 2016-05-01 ENCOUNTER — Encounter: Payer: Self-pay | Admitting: Family Medicine

## 2016-05-01 ENCOUNTER — Ambulatory Visit (INDEPENDENT_AMBULATORY_CARE_PROVIDER_SITE_OTHER): Payer: Commercial Managed Care - HMO | Admitting: Family Medicine

## 2016-05-01 ENCOUNTER — Ambulatory Visit
Admission: RE | Admit: 2016-05-01 | Discharge: 2016-05-01 | Disposition: A | Discharge status: Home / Self Care | Payer: Commercial Managed Care - HMO | Source: Ambulatory Visit | Attending: Family Medicine | Admitting: Family Medicine

## 2016-05-01 VITALS — BP 154/95 | HR 83 | Temp 97.8°F | Wt 228.0 lb

## 2016-05-01 DIAGNOSIS — M899 Disorder of bone, unspecified: Secondary | ICD-10-CM | POA: Insufficient documentation

## 2016-05-01 DIAGNOSIS — R404 Transient alteration of awareness: Secondary | ICD-10-CM | POA: Diagnosis not present

## 2016-05-01 DIAGNOSIS — R9082 White matter disease, unspecified: Secondary | ICD-10-CM | POA: Diagnosis not present

## 2016-05-01 DIAGNOSIS — N3000 Acute cystitis without hematuria: Secondary | ICD-10-CM | POA: Diagnosis not present

## 2016-05-01 DIAGNOSIS — R410 Disorientation, unspecified: Secondary | ICD-10-CM | POA: Insufficient documentation

## 2016-05-01 DIAGNOSIS — R4182 Altered mental status, unspecified: Secondary | ICD-10-CM | POA: Diagnosis not present

## 2016-05-01 LAB — POCT I-STAT CREATININE: Creatinine, Ser: 1 mg/dL (ref 0.44–1.00)

## 2016-05-01 MED ORDER — GADOBENATE DIMEGLUMINE 529 MG/ML IV SOLN
20.0000 mL | Freq: Once | INTRAVENOUS | Status: AC | PRN
  Administered 2016-05-01: 20 mL via INTRAVENOUS

## 2016-05-01 MED ORDER — CIPROFLOXACIN HCL 500 MG PO TABS: 500.0000 mg | ORAL_TABLET | Freq: Two times a day (BID) | ORAL | Status: DC

## 2016-05-01 NOTE — Progress Notes (Signed)
BP 154/95 mmHg  Pulse 83  Temp(Src) 97.8 F (36.6 C)  Wt 228 lb (103.42 kg)  SpO2 97%   Subjective:    Patient ID: Dawn Moore, female    DOB: 01-13-1941, 75 y.o.   MRN: [id number]  HPI: Dawn Moore is a 75 y.o. female  Chief Complaint  Patient presents with  . Hospitalization Follow-up    follow up   [place]- was traveling last week when on Sunday she started having trouble finding words and had some word salad. Couldn't write them either. They thought it was the seroquel as it occasionally makes her loopy. Went to bed. In the AM, no better. Instructed by here to go to local ER. Continued until Monday evening. CT at [place] normal. ?UTI, given IV rocephin. Admitted for TIA work up. Left AMA prior to MRI being done. No problems since then. Had MRI this AM. Time since discharge: 1 week Hospital/facility: [facility] Diagnosis: Acute encephalopathy, possible UTI Procedures/tests: CT of the head, EKG, labs Consultants: None New medications: IV rocephin, nothing else Discharge instructions:  Left AMA- need to have MRI and follow up here Status: better  Relevant past medical, surgical, family and social history reviewed and updated as indicated. Interim medical history since our last visit reviewed. Allergies and medications reviewed and updated.  Review of Systems  Constitutional: Negative.   Respiratory: Negative.   Cardiovascular: Negative.   Neurological: Positive for speech difficulty and headaches. Negative for dizziness, tremors, seizures, syncope, facial asymmetry, weakness, light-headedness and numbness.  Psychiatric/Behavioral: Positive for confusion, sleep disturbance and dysphoric mood. Negative for suicidal ideas, hallucinations, behavioral problems, self-injury, decreased concentration and agitation. The patient is nervous/anxious. The patient is not hyperactive.    Per HPI unless specifically indicated above      Objective:    BP 154/95 mmHg  Pulse 83  Temp(Src) 97.8 F (36.6 C)  Wt 228 lb (103.42 kg)  SpO2 97%  Wt Readings from Last 3 Encounters:  05/01/16 228 lb (103.42 kg)  03/28/16 226 lb (102.513 kg)  03/23/16 225 lb 3.2 oz (102.15 kg)    Physical Exam  Constitutional: She is oriented to person, place, and time. She appears well-developed and well-nourished. No distress.  HENT:  Head: Normocephalic and atraumatic.  Right Ear: Hearing normal.  Left Ear: Hearing normal.  Nose: Nose normal.  Eyes: Conjunctivae and lids are normal. Right eye exhibits no discharge. Left eye exhibits no discharge. No scleral icterus.  Cardiovascular: Normal rate, regular rhythm, normal heart sounds and intact distal pulses.  Exam reveals no gallop and no friction rub.   No murmur heard. Pulmonary/Chest: Effort normal and breath sounds normal. No respiratory distress. She has no wheezes. She has no rales. She exhibits no tenderness.  Musculoskeletal: Normal range of motion.  Neurological: She is alert and oriented to person, place, and time. She has normal reflexes. She displays normal reflexes. No cranial nerve deficit. She exhibits normal muscle tone. Coordination normal.  Skin: Skin is warm, dry and intact. No rash noted. No erythema. No pallor.  Psychiatric: She has a normal mood and affect. Her speech is normal and behavior is normal. Judgment and thought content normal. Cognition and memory are normal.  Nursing note and vitals reviewed.   Results for orders placed or performed during the hospital encounter of 05/01/16  I-STAT creatinine  Result Value Ref Range   Creatinine, Ser 1.00 0.44 - 1.00 mg/dL      Assessment &  Plan:   Problem List Items Addressed This Visit      Other   Transient alteration of awareness - Primary    Of unclear etiology. MRI normal. No sign of stroke. Possible TIA vs UTI. Will treat UTI. Will get into see neurology. Referral generated today. Call with any concerns.  Warning signs discussed.       Relevant Orders   Ambulatory referral to Neurology    Other Visit Diagnoses    Acute cystitis without hematuria        Still 1+ leuks. Await culture. Will treat with cipro x7 days. Call if not getting better or getting worse.     Relevant Orders    UA/M w/rflx Culture, Routine        Follow up plan: Return As scheduled.

## 2016-05-01 NOTE — Assessment & Plan Note (Signed)
Of unclear etiology. MRI normal. No sign of stroke. Possible TIA vs UTI. Will treat UTI. Will get into see neurology. Referral generated today. Call with any concerns. Warning signs discussed.

## 2016-05-03 LAB — MICROSCOPIC EXAMINATION: RBC, UA: NONE SEEN /hpf (ref 0–?)

## 2016-05-03 LAB — UA/M W/RFLX CULTURE, ROUTINE
BILIRUBIN UA: NEGATIVE
GLUCOSE, UA: NEGATIVE
KETONES UA: NEGATIVE
Nitrite, UA: NEGATIVE
Protein, UA: NEGATIVE
RBC, UA: NEGATIVE
SPEC GRAV UA: 1.02 (ref 1.005–1.030)
Urobilinogen, Ur: 1 mg/dL (ref 0.2–1.0)
pH, UA: 5.5 (ref 5.0–7.5)

## 2016-05-09 ENCOUNTER — Other Ambulatory Visit: Payer: Self-pay | Admitting: Family Medicine

## 2016-05-09 MED ORDER — ALPRAZOLAM 1 MG PO TABS: 1.0000 mg | ORAL_TABLET | Freq: Three times a day (TID) | ORAL | Status: DC | PRN

## 2016-05-29 ENCOUNTER — Other Ambulatory Visit: Payer: Self-pay | Admitting: Family Medicine

## 2016-05-29 MED ORDER — ALPRAZOLAM 1 MG PO TABS: 1.0000 mg | ORAL_TABLET | Freq: Three times a day (TID) | ORAL | Status: DC | PRN

## 2016-05-30 ENCOUNTER — Other Ambulatory Visit: Payer: Self-pay | Admitting: Family Medicine

## 2016-06-05 ENCOUNTER — Other Ambulatory Visit: Payer: Self-pay | Admitting: Family Medicine

## 2016-06-06 ENCOUNTER — Other Ambulatory Visit: Payer: Self-pay | Admitting: Family Medicine

## 2016-06-07 ENCOUNTER — Other Ambulatory Visit: Payer: Self-pay | Admitting: Family Medicine

## 2016-06-08 ENCOUNTER — Ambulatory Visit: Payer: Medicare (Managed Care) | Admitting: Neurology

## 2016-06-15 ENCOUNTER — Ambulatory Visit (INDEPENDENT_AMBULATORY_CARE_PROVIDER_SITE_OTHER): Payer: Commercial Managed Care - HMO | Admitting: Family Medicine

## 2016-06-15 ENCOUNTER — Encounter: Payer: Self-pay | Admitting: Family Medicine

## 2016-06-15 VITALS — BP 139/89 | HR 89 | Temp 98.7°F | Wt 229.0 lb

## 2016-06-15 DIAGNOSIS — E1121 Type 2 diabetes mellitus with diabetic nephropathy: Secondary | ICD-10-CM | POA: Diagnosis not present

## 2016-06-15 DIAGNOSIS — N182 Chronic kidney disease, stage 2 (mild): Secondary | ICD-10-CM | POA: Diagnosis not present

## 2016-06-15 DIAGNOSIS — F329 Major depressive disorder, single episode, unspecified: Secondary | ICD-10-CM | POA: Diagnosis not present

## 2016-06-15 DIAGNOSIS — E1122 Type 2 diabetes mellitus with diabetic chronic kidney disease: Secondary | ICD-10-CM | POA: Diagnosis not present

## 2016-06-15 DIAGNOSIS — F32A Depression, unspecified: Secondary | ICD-10-CM

## 2016-06-15 LAB — BAYER DCA HB A1C WAIVED: HB A1C: 6.7 % (ref <7.0)

## 2016-06-15 NOTE — Assessment & Plan Note (Signed)
Not doing well at all. Cannot increase seroquel as it makes her very sleepy. Max dose of medication. Will call Dr. Erlinda Hong, her psychiatrist to get back in. New referral put in today. Recheck 2 weeks. Off the buspar due to falls. Call with concerns.

## 2016-06-15 NOTE — Assessment & Plan Note (Signed)
Under good control. Continue current regimen. Continue to monitor. Call with any concerns. 

## 2016-06-15 NOTE — Progress Notes (Signed)
BP 139/89 mmHg  Pulse 89  Temp(Src) 98.7 F (37.1 C)  Wt 229 lb (103.874 kg)  SpO2 92%   Subjective:    Patient ID: Dawn Moore, female    DOB: 1941/11/02, 75 y.o.   MRN: [id number]  HPI: Dawn Moore is a 75 y.o. female  Chief Complaint  Patient presents with  . Depression  . Diabetes   DIABETES Hypoglycemic episodes:no Polydipsia/polyuria: no Visual disturbance: no Chest pain: yes Paresthesias: no Glucose Monitoring: no Taking Insulin?: no Blood Pressure Monitoring: not checking Retinal Examination: Up to Date Foot Exam: Up to Date Diabetic Education: Completed Pneumovax: Not up to Date Influenza: Up to Date Aspirin: yes  DEPRESSION- Still does not know what's happening with her son's murder. Mood is getting worse. Now falling. Has not been using a cane. Does not want to see counsellor at this time, but would be willing to go back to Dr. Kasandra Knudsen, who she has seen before.  Mood status: exacerbated Satisfied with current treatment?: no Symptom severity: severe  Duration of current treatment : chronic Side effects: yes Medication compliance: excellent compliance Psychotherapy/counseling: no but in the past Depressed mood: yes Anxious mood: yes Anhedonia: yes Significant weight loss or gain: no Insomnia: yes hard to stay asleep Fatigue: yes Feelings of worthlessness or guilt: yes Impaired concentration/indecisiveness: yes Suicidal ideations: no Hopelessness: yes Crying spells: yes Depression screen [facility] 2/9 06/15/2016 03/23/2016 02/27/2016 01/30/2016 09/23/2015  Decreased Interest 3 3 2 3  -  Down, Depressed, Hopeless 3 3 2 3  -  PHQ - 2 Score 6 6 4 6  -  Altered sleeping - 3 2 3 1   Tired, decreased energy - 3 2 3 1   Change in appetite - 3 3 3  -  Feeling bad or failure about yourself  - 3 2 2  -  Trouble concentrating - 3 2 3  -  Moving slowly or fidgety/restless - 0 2 2 -  Suicidal thoughts - 0 2 2 -  PHQ-9 Score - 21 19 24  -  Difficult doing work/chores - Very  difficult Very difficult Extremely dIfficult -    Relevant past medical, surgical, family and social history reviewed and updated as indicated. Interim medical history since our last visit reviewed. Allergies and medications reviewed and updated.  Review of Systems  Constitutional: Negative.   Respiratory: Negative.   Cardiovascular: Positive for chest pain (with panic attacks). Negative for palpitations and leg swelling.  Neurological: Negative.   Psychiatric/Behavioral: Positive for behavioral problems, confusion, sleep disturbance, dysphoric mood, decreased concentration and agitation. Negative for suicidal ideas, hallucinations and self-injury. The patient is nervous/anxious. The patient is not hyperactive.     Per HPI unless specifically indicated above     Objective:    BP 139/89 mmHg  Pulse 89  Temp(Src) 98.7 F (37.1 C)  Wt 229 lb (103.874 kg)  SpO2 92%  Wt Readings from Last 3 Encounters:  06/15/16 229 lb (103.874 kg)  05/01/16 228 lb (103.42 kg)  03/28/16 226 lb (102.513 kg)    Physical Exam  Constitutional: She is oriented to person, place, and time. She appears well-developed and well-nourished. No distress.  HENT:  Head: Normocephalic and atraumatic.  Right Ear: Hearing and external ear normal.  Left Ear: Hearing and external ear normal.  Nose: Nose normal.  Mouth/Throat: Oropharynx is clear and moist. No oropharyngeal exudate.  Eyes: Conjunctivae, EOM and lids are normal. Pupils are equal, round, and reactive to light. Right eye exhibits no discharge. Left eye exhibits no discharge. No  scleral icterus.  Neck: Normal range of motion. Neck supple. No JVD present. No tracheal deviation present. No thyromegaly present.  Pulmonary/Chest: Effort normal. No stridor. No respiratory distress.  Musculoskeletal: Normal range of motion.  Lymphadenopathy:    She has no cervical adenopathy.  Neurological: She is alert and oriented to person, place, and time.  Skin: Skin  is warm, dry and intact. No rash noted. She is not diaphoretic. No erythema. No pallor.  Psychiatric: Judgment and thought content normal. Her mood appears anxious. Her speech is delayed. She is slowed. Cognition and memory are normal. She exhibits a depressed mood.  Nursing note and vitals reviewed.   Results for orders placed or performed in visit on 06/15/16  Bayer DCA Hb A1c Waived  Result Value Ref Range   Bayer DCA Hb A1c Waived 6.7 <7.0 %      Assessment & Plan:   Problem List Items Addressed This Visit      Endocrine   Diabetes mellitus with renal manifestations, controlled ([place])    Under good control. Continue current regimen. Continue to monitor. Call with any concerns.         Other   Depression    Not doing well at all. Cannot increase seroquel as it makes her very sleepy. Max dose of medication. Will call Dr. Erlinda Hong, her psychiatrist to get back in. New referral put in today. Recheck 2 weeks. Off the buspar due to falls. Call with concerns.       Relevant Orders   Ambulatory referral to Psychiatry    Other Visit Diagnoses    Type 2 diabetes mellitus with diabetic nephropathy, without long-term current use of insulin ([place])    -  Primary    Relevant Orders    Bayer DCA Hb A1c Waived (Completed)        Follow up plan: Return in about 2 weeks (around 06/29/2016).

## 2016-06-29 ENCOUNTER — Ambulatory Visit: Payer: Commercial Managed Care - HMO | Admitting: Family Medicine

## 2016-07-02 ENCOUNTER — Ambulatory Visit (INDEPENDENT_AMBULATORY_CARE_PROVIDER_SITE_OTHER): Payer: Commercial Managed Care - HMO | Admitting: Family Medicine

## 2016-07-02 ENCOUNTER — Encounter: Payer: Self-pay | Admitting: Family Medicine

## 2016-07-02 ENCOUNTER — Other Ambulatory Visit: Payer: Self-pay | Admitting: Family Medicine

## 2016-07-02 VITALS — BP 144/90 | HR 85 | Temp 98.0°F | Ht 66.0 in | Wt 230.0 lb

## 2016-07-02 DIAGNOSIS — R4182 Altered mental status, unspecified: Secondary | ICD-10-CM | POA: Diagnosis not present

## 2016-07-02 DIAGNOSIS — F329 Major depressive disorder, single episode, unspecified: Secondary | ICD-10-CM

## 2016-07-02 DIAGNOSIS — F32A Depression, unspecified: Secondary | ICD-10-CM

## 2016-07-02 MED ORDER — ALPRAZOLAM 1 MG PO TABS: 1.0000 mg | ORAL_TABLET | Freq: Three times a day (TID) | ORAL | Status: DC | PRN

## 2016-07-02 NOTE — Assessment & Plan Note (Signed)
Better than it has been in months. Concerned about polypharmacy. Will decrease effexor and recheck in 3 weeks. Call with any concerns.

## 2016-07-02 NOTE — Progress Notes (Signed)
BP 144/90 mmHg  Pulse 85  Temp(Src) 98 F (36.7 C)  Ht 5\' 6"  (1.676 m)  Wt 230 lb (104.327 kg)  BMI 37.14 kg/m2  SpO2 96%   Subjective:    Patient ID: Dawn Moore, female    DOB: 31-Aug-1941, 75 y.o.   MRN: [id number]  HPI: Dawn Moore is a 75 y.o. female  Chief Complaint  Patient presents with  . Follow-up  . Altered Mental Status   Daughter is very concerned about Mom. Has been very confused. Trouble getting up and down out of bed. Fell into a door jam and didn't realize it. Having word finding difficulties. To see neurology, but not until 08/06/16. They all think she is on too much medicine. Concerned that that may be contributing.   DEPRESSION Mood status: better Satisfied with current treatment?: no Symptom severity: moderate  Duration of current treatment : chronic Side effects: yes Medication compliance: excellent compliance Psychotherapy/counseling: no  Depressed mood: yes Anxious mood: yes Anhedonia: yes Significant weight loss or gain: no Insomnia: yes hard to fall asleep Fatigue: yes Feelings of worthlessness or guilt: yes Impaired concentration/indecisiveness: yes Suicidal ideations: no Hopelessness: yes Crying spells: yes Depression screen [facility] 2/9 07/02/2016 06/15/2016 03/23/2016 02/27/2016 01/30/2016  Decreased Interest 2 3 3 2 3   Down, Depressed, Hopeless 2 3 3 2 3   PHQ - 2 Score 4 6 6 4 6   Altered sleeping 3 - 3 2 3   Tired, decreased energy 2 - 3 2 3   Change in appetite 0 - 3 3 3   Feeling bad or failure about yourself  2 - 3 2 2   Trouble concentrating 2 - 3 2 3   Moving slowly or fidgety/restless 0 - 0 2 2  Suicidal thoughts 0 - 0 2 2  PHQ-9 Score 13 - 21 19 24   Difficult doing work/chores Very difficult - Very difficult Very difficult Extremely dIfficult     Relevant past medical, surgical, family and social history reviewed and updated as indicated. Interim medical history since our last visit reviewed. Allergies and medications reviewed and  updated.  Review of Systems  Constitutional: Negative.   Respiratory: Negative.   Cardiovascular: Negative.   Psychiatric/Behavioral: Negative.     Per HPI unless specifically indicated above     Objective:    BP 144/90 mmHg  Pulse 85  Temp(Src) 98 F (36.7 C)  Ht 5\' 6"  (1.676 m)  Wt 230 lb (104.327 kg)  BMI 37.14 kg/m2  SpO2 96%  Wt Readings from Last 3 Encounters:  07/02/16 230 lb (104.327 kg)  06/15/16 229 lb (103.874 kg)  05/01/16 228 lb (103.42 kg)    Physical Exam  Constitutional: She is oriented to person, place, and time. She appears well-developed and well-nourished. No distress.  HENT:  Head: Normocephalic and atraumatic.  Right Ear: Hearing normal.  Left Ear: Hearing normal.  Nose: Nose normal.  Eyes: Conjunctivae and lids are normal. Right eye exhibits no discharge. Left eye exhibits no discharge. No scleral icterus.  Cardiovascular: Normal rate, regular rhythm, normal heart sounds and intact distal pulses.  Exam reveals no gallop and no friction rub.   No murmur heard. Pulmonary/Chest: Effort normal and breath sounds normal. No respiratory distress. She has no wheezes. She has no rales. She exhibits no tenderness.  Musculoskeletal: Normal range of motion.  Neurological: She is alert and oriented to person, place, and time. She has normal reflexes. She displays normal reflexes. No cranial nerve deficit. She exhibits normal muscle tone. Coordination normal.  Skin: Skin is warm, dry and intact. No rash noted. No erythema. No pallor.  Psychiatric: Her speech is normal and behavior is normal. Judgment and thought content normal. Her affect is blunt. Cognition and memory are normal. She exhibits a depressed mood.  Nursing note and vitals reviewed.   Results for orders placed or performed in visit on 06/15/16  Bayer DCA Hb A1c Waived  Result Value Ref Range   Bayer DCA Hb A1c Waived 6.7 <7.0 %      Assessment & Plan:   Problem List Items Addressed This Visit       Other   Depression    Better than it has been in months. Concerned about polypharmacy. Will decrease effexor and recheck in 3 weeks. Call with any concerns.        Other Visit Diagnoses    Altered mental status, unspecified altered mental status type    -  Primary    Will recheck labs. Will cut down on polypharmacy, would do better cutting down on her xanax, but will broach that next visit. Warning signs discussed.     Relevant Orders    Comprehensive metabolic panel    TSH    VITAMIN D 25 Hydroxy (Vit-D Deficiency, Fractures)    B12 and Folate Panel    CBC with Differential/Platelet        Follow up plan: Return in about 3 weeks (around 07/23/2016) for Follow up mood.

## 2016-07-03 LAB — COMPREHENSIVE METABOLIC PANEL
A/G RATIO: 1.4 (ref 1.2–2.2)
ALBUMIN: 3.6 g/dL (ref 3.5–4.8)
ALK PHOS: 85 IU/L (ref 39–117)
ALT: 22 IU/L (ref 0–32)
AST: 30 IU/L (ref 0–40)
BILIRUBIN TOTAL: 0.4 mg/dL (ref 0.0–1.2)
BUN / CREAT RATIO: 13 (ref 12–28)
BUN: 13 mg/dL (ref 8–27)
CALCIUM: 9.2 mg/dL (ref 8.7–10.3)
CHLORIDE: 104 mmol/L (ref 96–106)
CO2: 22 mmol/L (ref 18–29)
Creatinine, Ser: 0.99 mg/dL (ref 0.57–1.00)
GFR calc non Af Amer: 57 mL/min/{1.73_m2} — ABNORMAL LOW (ref >59)
GFR, EST AFRICAN AMERICAN: 65 mL/min/{1.73_m2} (ref >59)
Globulin, Total: 2.5 g/dL (ref 1.5–4.5)
Glucose: 73 mg/dL (ref 65–99)
POTASSIUM: 5.1 mmol/L (ref 3.5–5.2)
Sodium: 140 mmol/L (ref 134–144)
TOTAL PROTEIN: 6.1 g/dL (ref 6.0–8.5)

## 2016-07-03 LAB — CBC WITH DIFFERENTIAL/PLATELET
BASOS: 0 %
Basophils Absolute: 0 10*3/uL (ref 0.0–0.2)
EOS (ABSOLUTE): 0.2 10*3/uL (ref 0.0–0.4)
Eos: 3 %
HEMOGLOBIN: 11.7 g/dL (ref 11.1–15.9)
Hematocrit: 36 % (ref 34.0–46.6)
IMMATURE GRANS (ABS): 0 10*3/uL (ref 0.0–0.1)
Immature Granulocytes: 0 %
LYMPHS: 36 %
Lymphocytes Absolute: 1.7 10*3/uL (ref 0.7–3.1)
MCH: 28.1 pg (ref 26.6–33.0)
MCHC: 32.5 g/dL (ref 31.5–35.7)
MCV: 86 fL (ref 79–97)
MONOCYTES: 7 %
Monocytes Absolute: 0.3 10*3/uL (ref 0.1–0.9)
NEUTROS ABS: 2.6 10*3/uL (ref 1.4–7.0)
Neutrophils: 54 %
PLATELETS: 125 10*3/uL — AB (ref 150–379)
RBC: 4.17 x10E6/uL (ref 3.77–5.28)
RDW: 15.7 % — AB (ref 12.3–15.4)
WBC: 4.8 10*3/uL (ref 3.4–10.8)

## 2016-07-03 LAB — B12 AND FOLATE PANEL
Folate: 9.4 ng/mL (ref >3.0)
VITAMIN B 12: 414 pg/mL (ref 211–946)

## 2016-07-03 LAB — VITAMIN D 25 HYDROXY (VIT D DEFICIENCY, FRACTURES): Vit D, 25-Hydroxy: 37.3 ng/mL (ref 30.0–100.0)

## 2016-07-03 LAB — TSH: TSH: 2.88 u[IU]/mL (ref 0.450–4.500)

## 2016-07-04 ENCOUNTER — Encounter: Payer: Self-pay | Admitting: Family Medicine

## 2016-07-10 ENCOUNTER — Telehealth: Payer: Self-pay | Admitting: Family Medicine

## 2016-07-10 MED ORDER — TRIAMCINOLONE ACETONIDE 0.5 % EX OINT: 1.0000 "application " | TOPICAL_OINTMENT | Freq: Two times a day (BID) | CUTANEOUS | Status: DC

## 2016-07-10 NOTE — Telephone Encounter (Signed)
Rx sent to her pharmacy, if that doesn't help, I should see her.

## 2016-07-10 NOTE — Telephone Encounter (Signed)
Pharmacy: [place], [place] Patient daughter called stating that patient has poisen ivy and would like to get something called in. She stated that they have tried over the counter medication but has not helped.  Please call Lattie Haw at [phone].

## 2016-07-24 ENCOUNTER — Ambulatory Visit (INDEPENDENT_AMBULATORY_CARE_PROVIDER_SITE_OTHER): Payer: Commercial Managed Care - HMO | Admitting: Family Medicine

## 2016-07-24 ENCOUNTER — Encounter: Payer: Self-pay | Admitting: Family Medicine

## 2016-07-24 ENCOUNTER — Other Ambulatory Visit: Payer: Self-pay | Admitting: Family Medicine

## 2016-07-24 VITALS — BP 125/78 | HR 97 | Temp 98.3°F | Wt 227.0 lb

## 2016-07-24 DIAGNOSIS — F32A Depression, unspecified: Secondary | ICD-10-CM

## 2016-07-24 DIAGNOSIS — Z23 Encounter for immunization: Secondary | ICD-10-CM | POA: Diagnosis not present

## 2016-07-24 DIAGNOSIS — R404 Transient alteration of awareness: Secondary | ICD-10-CM | POA: Diagnosis not present

## 2016-07-24 DIAGNOSIS — F329 Major depressive disorder, single episode, unspecified: Secondary | ICD-10-CM

## 2016-07-24 MED ORDER — ALPRAZOLAM 1 MG PO TABS: 1.0000 mg | ORAL_TABLET | Freq: Three times a day (TID) | ORAL | 0 refills | Status: DC | PRN

## 2016-07-24 NOTE — Progress Notes (Signed)
BP 125/78 (BP Location: Left Arm, Patient Position: Sitting, Cuff Size: Large)   Pulse 97   Temp 98.3 F (36.8 C)   Wt 227 lb (103 kg)   SpO2 94%   BMI 36.64 kg/m    Subjective:    Patient ID: Dawn Moore, female    DOB: 06-20-1941, 75 y.o.   MRN: [id number]  HPI: Dawn Moore is a 75 y.o. female  Chief Complaint  Patient presents with  . Depression   Feeling anxious on the lower dose of the effexor. Memory has not gotten any better. Daughter is still really concerned about her memory- gets really confused, especially at night. Has been taking her xanax with her other night time medicine. Doesn't want to go back up on her effexor. Otherwise feeling well with no other concerns or complaints at this time.  Depression screen [facility] 2/9 07/24/2016 07/02/2016 06/15/2016 03/23/2016 02/27/2016  Decreased Interest 1 2 3 3 2   Down, Depressed, Hopeless 1 2 3 3 2   PHQ - 2 Score 2 4 6 6 4   Altered sleeping - 3 - 3 2  Tired, decreased energy - 2 - 3 2  Change in appetite - 0 - 3 3  Feeling bad or failure about yourself  - 2 - 3 2  Trouble concentrating - 2 - 3 2  Moving slowly or fidgety/restless - 0 - 0 2  Suicidal thoughts - 0 - 0 2  PHQ-9 Score - 13 - 21 19  Difficult doing work/chores - Very difficult - Very difficult Very difficult   Relevant past medical, surgical, family and social history reviewed and updated as indicated. Interim medical history since our last visit reviewed. Allergies and medications reviewed and updated.  Review of Systems  Constitutional: Negative.   Respiratory: Negative.   Cardiovascular: Negative.   Psychiatric/Behavioral: Negative.     Per HPI unless specifically indicated above     Objective:    BP 125/78 (BP Location: Left Arm, Patient Position: Sitting, Cuff Size: Large)   Pulse 97   Temp 98.3 F (36.8 C)   Wt 227 lb (103 kg)   SpO2 94%   BMI 36.64 kg/m   Wt Readings from Last 3 Encounters:  07/24/16 227 lb (103 kg)  07/02/16 230 lb  (104.3 kg)  06/15/16 229 lb (103.9 kg)    Physical Exam  Constitutional: She is oriented to person, place, and time. She appears well-developed and well-nourished. No distress.  HENT:  Head: Normocephalic and atraumatic.  Right Ear: Hearing normal.  Left Ear: Hearing normal.  Nose: Nose normal.  Eyes: Conjunctivae and lids are normal. Right eye exhibits no discharge. Left eye exhibits no discharge. No scleral icterus.  Cardiovascular: Normal rate, regular rhythm, normal heart sounds and intact distal pulses.  Exam reveals no gallop and no friction rub.   No murmur heard. Pulmonary/Chest: Effort normal and breath sounds normal. No respiratory distress. She has no wheezes. She has no rales. She exhibits no tenderness.  Musculoskeletal: Normal range of motion.  Neurological: She is alert and oriented to person, place, and time.  Skin: Skin is warm, dry and intact. No rash noted. No erythema. No pallor.  Psychiatric: She has a normal mood and affect. Her speech is normal and behavior is normal. Judgment and thought content normal. Cognition and memory are normal.  Nursing note and vitals reviewed.   Results for orders placed or performed in visit on 07/02/16  Comprehensive metabolic panel  Result Value Ref Range  Glucose 73 65 - 99 mg/dL   BUN 13 8 - 27 mg/dL   Creatinine, Ser 0.99 0.57 - 1.00 mg/dL   GFR calc non Af Amer 57 (L) >59 mL/min/1.73   GFR calc Af Amer 65 >59 mL/min/1.73   BUN/Creatinine Ratio 13 12 - 28   Sodium 140 134 - 144 mmol/L   Potassium 5.1 3.5 - 5.2 mmol/L   Chloride 104 96 - 106 mmol/L   CO2 22 18 - 29 mmol/L   Calcium 9.2 8.7 - 10.3 mg/dL   Total Protein 6.1 6.0 - 8.5 g/dL   Albumin 3.6 3.5 - 4.8 g/dL   Globulin, Total 2.5 1.5 - 4.5 g/dL   Albumin/Globulin Ratio 1.4 1.2 - 2.2   Bilirubin Total 0.4 0.0 - 1.2 mg/dL   Alkaline Phosphatase 85 39 - 117 IU/L   AST 30 0 - 40 IU/L   ALT 22 0 - 32 IU/L  TSH  Result Value Ref Range   TSH 2.880 0.450 - 4.500  uIU/mL  VITAMIN D 25 Hydroxy (Vit-D Deficiency, Fractures)  Result Value Ref Range   Vit D, 25-Hydroxy 37.3 30.0 - 100.0 ng/mL  B12 and Folate Panel  Result Value Ref Range   Vitamin B-12 414 211 - 946 pg/mL   Folate 9.4 >3.0 ng/mL  CBC with Differential/Platelet  Result Value Ref Range   WBC 4.8 3.4 - 10.8 x10E3/uL   RBC 4.17 3.77 - 5.28 x10E6/uL   Hemoglobin 11.7 11.1 - 15.9 g/dL   Hematocrit 36.0 34.0 - 46.6 %   MCV 86 79 - 97 fL   MCH 28.1 26.6 - 33.0 pg   MCHC 32.5 31.5 - 35.7 g/dL   RDW 15.7 (H) 12.3 - 15.4 %   Platelets 125 (L) 150 - 379 x10E3/uL   Neutrophils 54 %   Lymphs 36 %   Monocytes 7 %   Eos 3 %   Basos 0 %   Neutrophils Absolute 2.6 1.4 - 7.0 x10E3/uL   Lymphocytes Absolute 1.7 0.7 - 3.1 x10E3/uL   Monocytes Absolute 0.3 0.1 - 0.9 x10E3/uL   EOS (ABSOLUTE) 0.2 0.0 - 0.4 x10E3/uL   Basophils Absolute 0.0 0.0 - 0.2 x10E3/uL   Immature Granulocytes 0 %   Immature Grans (Abs) 0.0 0.0 - 0.1 x10E3/uL      Assessment & Plan:   Problem List Items Addressed This Visit      Other   Depression    Stable. Continue current regimen. Continue to monitor.       Transient alteration of awareness    I think this is due to polypharmacy. Seeing neurology in 2 weeks. Will stop taking her xanax at night and only take the seroquel. Check back in in 4 weeks. By phone next week.        Other Visit Diagnoses    Altered mental status, unspecified altered mental status type    -  Primary   Need for Tdap vaccination       Given today.    Relevant Orders   Tdap vaccine greater than or equal to 7yo IM (Completed)       Follow up plan: Return in about 4 weeks (around 08/21/2016).

## 2016-07-24 NOTE — Assessment & Plan Note (Signed)
Stable. Continue current regimen. Continue to monitor.  

## 2016-07-24 NOTE — Assessment & Plan Note (Signed)
I think this is due to polypharmacy. Seeing neurology in 2 weeks. Will stop taking her xanax at night and only take the seroquel. Check back in in 4 weeks. By phone next week.

## 2016-07-24 NOTE — Patient Instructions (Addendum)
Tdap Vaccine (Tetanus, Diphtheria and Pertussis): What You Need to Know 1. Why get vaccinated? Tetanus, diphtheria and pertussis are very serious diseases. Tdap vaccine can protect us from these diseases. And, Tdap vaccine given to pregnant women can protect newborn babies against pertussis. TETANUS (Lockjaw) is rare in the [place] today. It causes painful muscle tightening and stiffness, usually all over the body.  It can lead to tightening of muscles in the head and neck so you can't open your mouth, swallow, or sometimes even breathe. Tetanus kills about 1 out of 10 people who are infected even after receiving the best medical care. DIPHTHERIA is also rare in the [place] today. It can cause a thick coating to form in the back of the throat.  It can lead to breathing problems, heart failure, paralysis, and death. PERTUSSIS (Whooping Cough) causes severe coughing spells, which can cause difficulty breathing, vomiting and disturbed sleep.  It can also lead to weight loss, incontinence, and rib fractures. Up to 2 in 100 adolescents and 5 in 100 adults with pertussis are hospitalized or have complications, which could include pneumonia or death. These diseases are caused by bacteria. Diphtheria and pertussis are spread from person to person through secretions from coughing or sneezing. Tetanus enters the body through cuts, scratches, or wounds. Before vaccines, as many as 200,000 cases of diphtheria, 200,000 cases of pertussis, and hundreds of cases of tetanus, were reported in the [place] each year. Since vaccination began, reports of cases for tetanus and diphtheria have dropped by about 99% and for pertussis by about 80%. 2. Tdap vaccine Tdap vaccine can protect adolescents and adults from tetanus, diphtheria, and pertussis. One dose of Tdap is routinely given at age 11 or 12. People who did not get Tdap at that age should get it as soon as possible. Tdap is especially important  for healthcare professionals and anyone having close contact with a baby younger than 12 months. Pregnant women should get a dose of Tdap during every pregnancy, to protect the newborn from pertussis. Infants are most at risk for severe, life-threatening complications from pertussis. Another vaccine, called Td, protects against tetanus and diphtheria, but not pertussis. A Td booster should be given every 10 years. Tdap may be given as one of these boosters if you have never gotten Tdap before. Tdap may also be given after a severe cut or burn to prevent tetanus infection. Your doctor or the person giving you the vaccine can give you more information. Tdap may safely be given at the same time as other vaccines. 3. Some people should not get this vaccine  A person who has ever had a life-threatening allergic reaction after a previous dose of any diphtheria, tetanus or pertussis containing vaccine, OR has a severe allergy to any part of this vaccine, should not get Tdap vaccine. Tell the person giving the vaccine about any severe allergies.  Anyone who had coma or long repeated seizures within 7 days after a childhood dose of DTP or DTaP, or a previous dose of Tdap, should not get Tdap, unless a cause other than the vaccine was found. They can still get Td.  Talk to your doctor if you:  have seizures or another nervous system problem,  had severe pain or swelling after any vaccine containing diphtheria, tetanus or pertussis,  ever had a condition called Guillain-Barr Syndrome (GBS),  aren't feeling well on the day the shot is scheduled. 4. Risks With any medicine, including vaccines, there is   a chance of side effects. These are usually mild and go away on their own. Serious reactions are also possible but are rare. Most people who get Tdap vaccine do not have any problems with it. Mild problems following Tdap (Did not interfere with activities)  Pain where the shot was given (about 3 in 4  adolescents or 2 in 3 adults)  Redness or swelling where the shot was given (about 1 person in 5)  Mild fever of at least 100.4F (up to about 1 in 25 adolescents or 1 in 100 adults)  Headache (about 3 or 4 people in 10)  Tiredness (about 1 person in 3 or 4)  Nausea, vomiting, diarrhea, stomach ache (up to 1 in 4 adolescents or 1 in 10 adults)  Chills, sore joints (about 1 person in 10)  Body aches (about 1 person in 3 or 4)  Rash, swollen glands (uncommon) Moderate problems following Tdap (Interfered with activities, but did not require medical attention)  Pain where the shot was given (up to 1 in 5 or 6)  Redness or swelling where the shot was given (up to about 1 in 16 adolescents or 1 in 12 adults)  Fever over 102F (about 1 in 100 adolescents or 1 in 250 adults)  Headache (about 1 in 7 adolescents or 1 in 10 adults)  Nausea, vomiting, diarrhea, stomach ache (up to 1 or 3 people in 100)  Swelling of the entire arm where the shot was given (up to about 1 in 500). Severe problems following Tdap (Unable to perform usual activities; required medical attention)  Swelling, severe pain, bleeding and redness in the arm where the shot was given (rare). Problems that could happen after any vaccine:  People sometimes faint after a medical procedure, including vaccination. Sitting or lying down for about 15 minutes can help prevent fainting, and injuries caused by a fall. Tell your doctor if you feel dizzy, or have vision changes or ringing in the ears.  Some people get severe pain in the shoulder and have difficulty moving the arm where a shot was given. This happens very rarely.  Any medication can cause a severe allergic reaction. Such reactions from a vaccine are very rare, estimated at fewer than 1 in a million doses, and would happen within a few minutes to a few hours after the vaccination. As with any medicine, there is a very remote chance of a vaccine causing a serious  injury or death. The safety of vaccines is always being monitored. For more information, visit: www.cdc.gov/vaccinesafety/ 5. What if there is a serious problem? What should I look for?  Look for anything that concerns you, such as signs of a severe allergic reaction, very high fever, or unusual behavior.  Signs of a severe allergic reaction can include hives, swelling of the face and throat, difficulty breathing, a fast heartbeat, dizziness, and weakness. These would usually start a few minutes to a few hours after the vaccination. What should I do?  If you think it is a severe allergic reaction or other emergency that can't wait, call 9-1-1 or get the person to the nearest hospital. Otherwise, call your doctor.  Afterward, the reaction should be reported to the Vaccine Adverse Event Reporting System (VAERS). Your doctor might file this report, or you can do it yourself through the VAERS web site at www.vaers.hhs.gov, or by calling 1-800-822-7967. VAERS does not give medical advice.  6. The National Vaccine Injury Compensation Program The National Vaccine Injury Compensation Program (  VICP) is a federal program that was created to compensate people who may have been injured by certain vaccines. Persons who believe they may have been injured by a vaccine can learn about the program and about filing a claim by calling 1-800-338-2382 or visiting the VICP website at www.hrsa.gov/vaccinecompensation. There is a time limit to file a claim for compensation. 7. How can I learn more?  Ask your doctor. He or she can give you the vaccine package insert or suggest other sources of information.  Call your local or state health department.  Contact the Centers for Disease Control and Prevention (CDC):  Call 1-800-232-4636 (1-800-CDC-INFO) or  Visit CDC's website at www.cdc.gov/vaccines CDC Tdap Vaccine VIS (02/23/14)   This information is not intended to replace advice given to you by your health care  provider. Make sure you discuss any questions you have with your health care provider.   Document Released: 06/17/2012 Document Revised: 01/07/2015 Document Reviewed: 03/31/2014 Elsevier Interactive Patient Education 2016 Elsevier Inc.  

## 2016-07-31 ENCOUNTER — Telehealth: Payer: Self-pay | Admitting: Family Medicine

## 2016-07-31 NOTE — Telephone Encounter (Signed)
Called and checked on Dawn Moore. Less confused not taking the xanax with the seroquel. Will stay off taking them together. Seeing neurology next week. Continue to monitor.

## 2016-07-31 NOTE — Telephone Encounter (Signed)
-----   Message from Valerie Roys, DO sent at 07/24/2016  2:18 PM EDT ----- Call and check in on her mood

## 2016-08-03 ENCOUNTER — Telehealth: Payer: Self-pay

## 2016-08-03 NOTE — Telephone Encounter (Signed)
[facility] called.  They contacted Dawn Moore and she said she did not want to pursue with psychiatry and schedule appointment at this time. They gave her the number to call back in case she changed her mind.

## 2016-08-06 ENCOUNTER — Encounter: Payer: Self-pay | Admitting: Neurology

## 2016-08-06 ENCOUNTER — Ambulatory Visit (INDEPENDENT_AMBULATORY_CARE_PROVIDER_SITE_OTHER): Payer: Commercial Managed Care - HMO | Admitting: Neurology

## 2016-08-06 VITALS — BP 134/96 | HR 93 | Ht 66.0 in | Wt 226.0 lb

## 2016-08-06 DIAGNOSIS — I679 Cerebrovascular disease, unspecified: Secondary | ICD-10-CM

## 2016-08-06 DIAGNOSIS — R413 Other amnesia: Secondary | ICD-10-CM | POA: Diagnosis not present

## 2016-08-06 DIAGNOSIS — R4701 Aphasia: Secondary | ICD-10-CM | POA: Diagnosis not present

## 2016-08-06 NOTE — Patient Instructions (Addendum)
1.  Increase aspirin to 325mg  daily 2.  Continue simvastatin. 3.  Will set you up for neuropsychological testing 4.  Check carotid doppler 5.  Follow up in 3 months.

## 2016-08-06 NOTE — Progress Notes (Signed)
NEUROLOGY CONSULTATION NOTE  JOSHA BERGSTRESSER MRN: [id number] DOB: 09-Jun-1941  Referring provider: Dr. Wynetta Emery Primary care provider: Dr. Wynetta Emery  Reason for consult:  TIA  HISTORY OF PRESENT ILLNESS: Dawn Moore is a 75 year old right-handed woman with diabetes, hyperlipidemia, GERD, chronic pain syndrome, depression and anxiety, and history of basal cell/squamous cell/and melanoma status post excision who presents for transient altered awareness.  History obtained by patient, her daughter and hospital notes.  She was travelling in [place] when on the evening of 04/23/16.  She developed "word salad" in which she exhibited fluent nonsensical speech.  She was unable to write correctly.  Sh understood what others were saying to her and she was aware she wasn't speaking right.  There was no associated facial droop or unilateral weakness.  At first, they thought it was a side effect of her Seroquel, which sometimes causes cognitive slowing.  However, she continued to have language difficulty the following morning.  She was taken to the nearest hospital.  She was admitted to [facility] for further evaluation.  CT of head and CXR showed no acute changes.  She was afebrile.  BMP and LFTs were unremarkable.  CBC was unremarkable except for low PLT count of 98.  Troponins were negative.  Sed rate was 5, RPR nonreactive, Hgb A1c 5.8, TSH 2.96, B12 313.  UA was positive for leukoesterase with few bacteria.  She was prescribed Rocefin.  Symptoms resolved the next morning.  She was supposed to have an MRI of the brain but she got agitated and left AMA.  When she returned home, she followed up with her PCP.  MRI of brain with and without contrast was performed on 05/01/16, which was personally reviewed, and revealed mild chronic small vessel ischemic changes but no acute/subacute stroke, bleed or malignancy.     Of note, she has short-term memory problems over the past year.  She forgets conversations  as well as things she has done, such as family visits or errands.  She does not get disoriented driving on familiar routes.  She is able to pay her bills.  She lives with her daughter.  Her mother had dementia.  Labs from last month include TSH of 2.88, D 37.3, B12 414, and folate 9.4.  CBC and CMP were also unremarkable.  PAST MEDICAL HISTORY: Past Medical History:  Diagnosis Date  . Anxiety   . Arthritis    OA knees, hands and feet  . Depression   . Diabetes mellitus without complication ([place])   . GERD (gastroesophageal reflux disease)   . Headache   . Osteopenia   . Shortness of breath dyspnea   . Sleep apnea    doesn't use CPAP  . 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 ABDOMINAL HYSTERECTOMY  1986   still has one ovary  . CATARACT EXTRACTION W/PHACO Left 03/07/2016   Procedure: CATARACT EXTRACTION PHACO AND INTRAOCULAR LENS PLACEMENT (IOC);  Surgeon: Leandrew Koyanagi, MD;  Location: [facility];  Service: Ophthalmology;  Laterality: Left;  DIABETIC -oral meds Sleep apnea  . CATARACT EXTRACTION W/PHACO Right 03/28/2016   Procedure: CATARACT EXTRACTION PHACO AND INTRAOCULAR LENS PLACEMENT (IOC);  Surgeon: Leandrew Koyanagi, MD;  Location: [facility];  Service: Ophthalmology;  Laterality: Right;  DIABETIC - oral meds  . CHOLECYSTECTOMY  1999  . COCHLEAR IMPLANT    . FOOT FRACTURE SURGERY    .  HEEL SPUR SURGERY Left 11/04/09   Revision 01/2011    MEDICATIONS: Current Outpatient Prescriptions on File Prior to Visit  Medication Sig Dispense Refill  . acetaminophen (TYLENOL) 500 MG tablet Take 500 mg by mouth 2 (two) times daily.    Marland Kitchen ALPRAZolam (XANAX) 1 MG tablet Take 1 tablet (1 mg total) by mouth 3 (three) times daily as needed for anxiety. 75 tablet 0  . Ascorbic Acid (VITAMIN C) 100 MG tablet Take 100 mg by mouth daily. Reported on 02/28/2016    . aspirin EC 81 MG  tablet Take 81 mg by mouth. Reported on 02/28/2016    . Cholecalciferol (D 1000) 1000 UNITS capsule Take by mouth.    . diclofenac (VOLTAREN) 75 MG EC tablet TAKE 1 TABLET TWICE DAILY 180 tablet 1  . gabapentin (NEURONTIN) 300 MG capsule TAKE 1 CAPSULE THREE TIMES DAILY 270 capsule 1  . metFORMIN (GLUCOPHAGE) 1000 MG tablet Take 0.5 tablets (500 mg total) by mouth 2 (two) times daily with a meal. 180 tablet 1  . Multiple Vitamins-Minerals (HAIR SKIN AND NAILS FORMULA) TABS Take by mouth once.    Marland Kitchen omeprazole (PRILOSEC) 20 MG capsule TAKE 1 CAPSULE EVERY DAY 90 capsule 1  . QUEtiapine (SEROQUEL) 25 MG tablet Take 1 tablet (25 mg total) by mouth at bedtime. 90 tablet 1  . simvastatin (ZOCOR) 10 MG tablet TAKE 1 TABLET EVERY EVENING 90 tablet 1  . triamcinolone ointment (KENALOG) 0.5 % Apply 1 application topically 2 (two) times daily. 30 g 0  . venlafaxine XR (EFFEXOR-XR) 150 MG 24 hr capsule TAKE 1 CAPSULE EVERY DAY (FOR TOTAL OF 225MG  PER DAY) 90 capsule 1   No current facility-administered medications on file prior to visit.     ALLERGIES: Allergies  Allergen Reactions  . Nsaids Other (See Comments)    Per Hematology  . Wellbutrin [Bupropion] Other (See Comments)    Thoughts that she would die    FAMILY HISTORY: Family History  Problem Relation Age of Onset  . Stroke Mother   . Dementia Mother   . Cancer Father     prostate  . Hyperlipidemia Son   . Hypertension Son   . Mental illness Son   . Alcohol abuse Son   . Arthritis Sister     rheumatoid  . Cancer Sister     breast  . Thyroid disease Sister   . Cancer Sister     thyroid  . Cancer Brother     prostate  . Arthritis Maternal Grandmother     SOCIAL HISTORY: Social History   Social History  . Marital status: Widowed    Spouse name: N/A  . Number of children: N/A  . Years of education: N/A   Occupational History  . Not on file.   Social History Main Topics  . Smoking status: Never Smoker  . Smokeless  tobacco: Never Used  . Alcohol use No  . Drug use: No  . Sexual activity: No   Other Topics Concern  . Not on file   Social History Narrative  . No narrative on file    REVIEW OF SYSTEMS: Constitutional: No fevers, chills, or sweats, no generalized fatigue, change in appetite Eyes: No visual changes, double vision, eye pain Ear, nose and throat: No hearing loss, ear pain, nasal congestion, sore throat Cardiovascular: No chest pain, palpitations Respiratory:  No shortness of breath at rest or with exertion, wheezes GastrointestinaI: No nausea, vomiting, diarrhea, abdominal pain, fecal incontinence Genitourinary:  No  dysuria, urinary retention or frequency Musculoskeletal:  No neck pain, back pain Integumentary: No rash, pruritus, skin lesions Neurological: as above Psychiatric: No depression, insomnia, anxiety Endocrine: No palpitations, fatigue, diaphoresis, mood swings, change in appetite, change in weight, increased thirst Hematologic/Lymphatic:  No purpura, petechiae. Allergic/Immunologic: no itchy/runny eyes, nasal congestion, recent allergic reactions, rashes  PHYSICAL EXAM: Vitals:   08/06/16 1436  BP: (!) 134/96  Pulse: 93   General: No acute distress.  Patient appears well-groomed.  Head:  Normocephalic/atraumatic Eyes:  fundi examined but not visualized Neck: supple, no paraspinal tenderness, full range of motion Back: No paraspinal tenderness Heart: regular rate and rhythm Lungs: Clear to auscultation bilaterally. Vascular: No carotid bruits. Neurological Exam: Mental status: alert and oriented to person, place, and time, recent and remote memory intact, fund of knowledge intact, attention and concentration intact, speech fluent and not dysarthric, language intact. MMSE - Mini Mental State Exam 08/06/2016  Orientation to time 4  Orientation to Place 4  Registration 3  Attention/ Calculation 5  Recall 2  Language- name 2 objects 2  Language- repeat 1    Language- follow 3 step command 2  Language- read & follow direction 1  Write a sentence 1  Copy design 1  Total score 26   Cranial nerves: CN I: not tested CN II: pupils equal, round and reactive to light, visual fields intact CN III, IV, VI:  full range of motion, no nystagmus, no ptosis CN V: facial sensation intact CN VII: upper and lower face symmetric CN VIII: hearing intact CN IX, X: gag intact, uvula midline CN XI: sternocleidomastoid and trapezius muscles intact CN XII: tongue midline Bulk & Tone: normal, no fasciculations. Motor:  5/5 throughout  Sensation:  Pinprick and vibration sensation intact. Deep Tendon Reflexes:  2+ throughout, toes downgoing.  Finger to nose testing:  Without dysmetria.  Heel to shin:  Without dysmetria.  Gait:  Normal station and stride.  Able to turn. Romberg negative.  IMPRESSION: Word-salad.  Etiology not clear.  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  Also, without other lateralizing symptoms such as unilateral weakness, a definite diagnosis cannot be made.  It could have been acute encephalopathy due to UTI, which was treated. Memory deficits  PLAN: I would treat for TIA. 1.  Increase ASA to 325mg  daily 2.  Continue statin therapy.  Check lipid panel.  LDL goal should be less than 70. 3.  Neuropsychological testing 4.  Carotid doppler 5.  Maintain glycemic control 6.  Follow up in 3 months.  Thank you for allowing me to take part in the care of this patient.  Metta Clines, DO  CC:  Park Liter, DO

## 2016-08-08 ENCOUNTER — Telehealth: Payer: Self-pay | Admitting: Neurology

## 2016-08-08 NOTE — Telephone Encounter (Signed)
Noted. Will forward to jaffe as an [place].

## 2016-08-08 NOTE — Telephone Encounter (Signed)
Dawn Moore 03/11/1941. Her daughter Kavia Aseltine called wanting to let her know that they had to cancel the Doppler that was scheduled for her mom. She is unable to pay for it. She is coming in for her 3 Neuro psych appointments still. Thank you

## 2016-08-10 ENCOUNTER — Ambulatory Visit: Payer: Commercial Managed Care - HMO

## 2016-08-13 ENCOUNTER — Encounter: Payer: Self-pay | Admitting: Psychology

## 2016-08-13 ENCOUNTER — Telehealth: Payer: Self-pay | Admitting: Neurology

## 2016-08-13 ENCOUNTER — Ambulatory Visit (INDEPENDENT_AMBULATORY_CARE_PROVIDER_SITE_OTHER): Payer: Commercial Managed Care - HMO | Admitting: Psychology

## 2016-08-13 DIAGNOSIS — R413 Other amnesia: Secondary | ICD-10-CM

## 2016-08-13 DIAGNOSIS — F329 Major depressive disorder, single episode, unspecified: Secondary | ICD-10-CM | POA: Diagnosis not present

## 2016-08-13 DIAGNOSIS — R4701 Aphasia: Secondary | ICD-10-CM | POA: Diagnosis not present

## 2016-08-13 DIAGNOSIS — F32A Depression, unspecified: Secondary | ICD-10-CM
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
## 2016-08-20 ENCOUNTER — Other Ambulatory Visit: Payer: Self-pay | Admitting: Family Medicine

## 2016-08-20 ENCOUNTER — Ambulatory Visit (INDEPENDENT_AMBULATORY_CARE_PROVIDER_SITE_OTHER): Payer: Commercial Managed Care - HMO | Admitting: Psychology

## 2016-08-20 DIAGNOSIS — F329 Major depressive disorder, single episode, unspecified: Secondary | ICD-10-CM

## 2016-08-20 DIAGNOSIS — R413 Other amnesia: Secondary | ICD-10-CM

## 2016-08-20 DIAGNOSIS — R4701 Aphasia: Secondary | ICD-10-CM

## 2016-08-20 DIAGNOSIS — F32A Depression, unspecified: Secondary | ICD-10-CM

## 2016-08-21 NOTE — Progress Notes (Signed)
   Neuropsychology Note  [name] returned today for 2 hours of neuropsychological testing with technician, Milana Kidney, BS, under the supervision of Dr. Macarthur Critchley. The patient did not appear overtly distressed by the testing session, per behavioral observation or via self-report to the technician. Rest breaks were offered. Dawn Moore will return within 2 weeks for a feedback session with Dr. Si Raider at which time her test performances, clinical impressions and treatment recommendations will be reviewed in detail. The patient understands she can contact our office should she require our assistance before this time.  Full report to follow.

## 2016-08-27 NOTE — Progress Notes (Signed)
NEUROPSYCHOLOGICAL EVALUATION   Name:    Dawn Moore  Date of Birth:   30-Mar-1941 Date of Interview:  08/13/2016 Date of Testing:  08/20/2016  Date of Feedback:  08/28/2016     Background Information:  Reason for Referral:  Dawn Moore is a 75 y.o. female referred by Dr. Metta Clines to assess her current level of cognitive functioning and assist in differential diagnosis. The current evaluation consisted of a review of available medical records, an interview with the patient and her daughter Lattie Haw, and the completion of a neuropsychological testing battery. Informed consent was obtained.  History of Presenting Problem:  Dawn Moore was traveling with her daughter in [place] when on the evening of 04/23/2016 she exhibited fluent nonsensical speech. She was unable to write clearly. She did not have any comprehension difficulties or disorientation. She was aware that she was unable to communicate properly. There was no associated facial droop or unilateral weakness. At first, her daughter thought it was a side effect of her Seroquel, which sometimes causes cognitive slowing. However, she continued to demonstrate language difficulty the following morning and was taken to the nearest hospital by her daughter. Workup was unremarkable for stroke but did reveal UTI. She was prescribed Rocephin and symptoms resolved the next morning. According to Dr. Georgie Chard note, she was supposed to have an MRI of the brain but she got agitated and left AMA. When she returned home, she followed up with her PCP. MRI of the brain with and without contrast was performed on 05/01/2016, which reportedly revealed mild chronic small vessel ischemic changes but no acute/subacute stroke, bleed or malignancy.   This episode is the only one that she has had of this kind. However, the patient does endorse progressive memory changes over the past few years. She reported that she forgets things her daughter has told her and she  forgets television shows she has just watched. Her daughter reported noticing a slight difference in the patient's memory after her husband died in April 10, 2012. She reported progressive worsening over time. The patient's daughter moved in with her just over a year ago. Her daughter has taken over most of the driving. Her daughter reported that she is fearful to ride with her mother due to the patient's tendency to swerve and drift all over the road. However she has not had any significant difficulty with navigating and has never gotten lost. Her daughter is also managing the patient's medications, appointments and meals. Even with her daughter helping, she does still forget to take medications on occasion. The patient was managing the bills and finances until recently. She wasn't having any significant difficulty doing this. However, her daughter took this over in order to set up automatic drafts.   Upon direct questioning, the patient/caregiver reported:  Forgetting recent conversations/events: Yes Repeating statements/questions: Yes Misplacing/losing items: Yes (glasses all the time) Forgetting appointments or other obligations: Dtr has to manage all that. Even when daughter tells her, she will misremember.  Forgetting to take medications: Dtr manages now. Previous to moving in, she would come every two weeks to fill her pillboxes.  Difficulty concentrating: Yes (eg can't focus on the newspaper, used to devour it). Can start but can't finish.  Starting but not finishing tasks: Yes Distracted easily: Yes Word-finding difficulty: Occasionally, not significant Word substitutions: Sometimes, not significant Writing difficulty: Harder now than it used to be. 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 Handwriting used to  be very pretty, now hard to read.  Comprehension difficulty: No problem  MoCA performed on 08/06/2016 was 26/30.  Family history is reportedly significant for  Alzheimer's dementia in the patient's late mother.  With regard to mood, the patient reports significant depression characterized by lack of motivation, tearfulness, vegetative symptoms hopelessness. She denied suicidal intention, citing her daughter as a main protective factor. She reported several hardships in recent years, including the unexpected death of her husband in 2012-04-03, the homicide of her son in 11/2015, and family tension with her granddaughter. The patient's daughter reported that the patient was very depressed after her husband's death but had gotten somewhat better over time until her son was murdered. She then "plummeted". She has very little desire to do anything. She prefers to stay in bed, but she will get up to eat meals that her daughter prepares for her. The patient saw a mental health provider right after her husband passed away but she did not find this to be helpful. A neighbor has invited her to a grief counseling group at her church, but the patient did not attend. She denied interest in grief therapy at the present time.  The patient also has a history of anxiety and panic disorder. She is prescribed Xanax for panic attacks. She takes this daily. The patient has also been on Effexor for several years. The dose of this medication was recently reduced, per her daughter. A couple of months prior to the patient's trip to [place] with her daughter, she started taking Seroquel. Her daughter reported that the medication sometimes makes her appear "drunk". She has tried Wellbutrin in the past but had adverse effects.  Physically, the patient complains of significant fatigue, which her daughter reports has been present since around Apr 03, 2012 when she underwent foot surgery. She reported increased fatigue over the past year. She tends to sleep during the day and be up at night.  The patient has also been experiencing unsteadiness, reduced balance and frequent falls. This has possibly been  going on for 3-4 years. She has hit her head in the context of such falls but has not lost consciousness. Use of a cane to ambulate has been recommended but the patient refuses.    Social History: Born/Raised: [place] Education: High school graduate Occupational history: [employer]. 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 The patient reported a very difficult transition to retirement and regret over having retired. Marital history: Widowed. Mx52 years. Children: Two (son is deceased) Alcohol/Tobacco/Substances: The patient does not drink alcohol and never has. She was never a smoker or tobacco user. She denied history of substance abuse or dependence.    Medical History:  Past Medical History:  Diagnosis Date  . Anxiety   . Arthritis    OA knees, hands and feet  . Depression   . Diabetes mellitus without complication ([place])   . GERD (gastroesophageal reflux disease)   . Headache   . Osteopenia   . Shortness of breath dyspnea   . Sleep apnea    doesn't use CPAP  . 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 acetaminophen (TYLENOL) 500 MG tablet Take 500 mg by mouth 2 (two) times daily.  Marland Kitchen ALPRAZolam (XANAX) 1 MG tablet Take 1 tablet (1 mg total) by mouth 3 (three) times daily as needed for anxiety.  . Ascorbic Acid (VITAMIN C) 100 MG tablet Take 100  mg by mouth daily. Reported on 02/28/2016  . aspirin EC 81 MG tablet Take 81 mg by mouth. Reported on 02/28/2016  . busPIRone (BUSPAR) 10 MG tablet TAKE 1 TABLET TWICE DAILY  . Cholecalciferol (D 1000) 1000 UNITS capsule Take by mouth.  . diclofenac (VOLTAREN) 75 MG EC tablet TAKE 1 TABLET TWICE DAILY  . gabapentin (NEURONTIN) 300 MG capsule TAKE 1 CAPSULE THREE TIMES DAILY  . metFORMIN (GLUCOPHAGE) 1000 MG tablet Take 0.5 tablets (500 mg total) by mouth 2 (two) times daily with a meal.  . Multiple Vitamins-Minerals (HAIR SKIN  AND NAILS FORMULA) TABS Take by mouth once.  Marland Kitchen omeprazole (PRILOSEC) 20 MG capsule TAKE 1 CAPSULE EVERY DAY  . QUEtiapine (SEROQUEL) 25 MG tablet TAKE 1 TABLET AT BEDTIME  . simvastatin (ZOCOR) 10 MG tablet TAKE 1 TABLET EVERY EVENING  . triamcinolone ointment (KENALOG) 0.5 % Apply 1 application topically 2 (two) times daily.  Marland Kitchen venlafaxine XR (EFFEXOR-XR) 150 MG 24 hr capsule TAKE 1 CAPSULE EVERY DAY (FOR TOTAL OF 225MG  PER DAY)   No facility-administered encounter medications on file as of 08/28/2016.      Current Examination:  Behavioral Observations:   Appearance: Casually dressed, appropriately groomed Gait: Ambulated independently, no gross abnormalities observed Speech: Fluent; normal rate, rhythm and volume Thought process: Linear, goal directed Affect: Full, dysthymic Interpersonal: Appropriate Orientation: Oriented to all spheres. Correctly states current President and his predecessor.  Tests Administered: . Test of Premorbid Functioning (TOPF) . Wechsler Adult Intelligence Scale-Fourth Edition (WAIS-IV): Similarities, Block Design, Matrix Reasoning, Coding and Digit Span subtests . [employer] Verbal Learning Test - 2nd Edition (CVLT-2) Short Form . Repeatable Battery for the Assessment of Neuropsychological Status (RBANS) Form A:  Figure Copy and Recall subtests, Story Memory and Recall subtests . Neuropsychological Assessment Battery (NAB) Language Module, Form 1: Naming Subtest . Controlled Oral Word Association Test (COWAT) . Trail Making Test A and B . Clock drawing test . Generalized Anxiety Disorder - 7 item screener (GAD-7) . Beck Depression Inventory - Second edition (BDI-II) . Beck Anxiety Inventory (BAI)  Test Results: Note: Standardized scores are presented only for use by appropriately trained professionals and to allow for any future test-retest comparison. These scores should not be interpreted without consideration of all the information that is contained  in the rest of the report. The most recent standardization samples from the test publisher or other sources were used whenever possible to derive standard scores; scores were corrected for age, gender, ethnicity and education when available.   Test Scores:  Test Name Standardized Score Descriptor  TOPF SS= 99 Average  WAIS-IV Subtests    Similarities ss= 9 Average  Block Design ss= 9 Average  Matrix Reasoning ss= 5 Borderline  Coding ss= 9 Average  Digit Span  ss= 7 Low average  RBANS Subtests    Figure Copy Z= -0.4 Average  Figure Recall Z= -1.8 Borderline  Story Memory Z= 1.0 High average  Story Recall Z= 0 Average  CVLT-II Scores    Trial 1 Z= -2.5 Impaired  Trial 4 Z= -2 Impaired  Trials 1-4 total T= 39 Low average  SD Free Recall Z= -0.5 Average  LD Free Recall Z= -1 Low average  LD Cued Recall Z= 0 Average  Recognition Discriminability (8/9 hits, 0 false positives) Z= 0 Average  Forced Choice Recognition Raw = 9/9 WNL  NAB Naming T= 59 High average  COWAT-FAS T= 39 Low average  COWAT-Animals T= 51 Average  Trail Making Test A  0 errors T= 54 Average  Trail Making Test B 1 error T= 43 Average  Clock Drawing  Impaired  GAD-7 14/21 Severe   BDI-II 23/63 Moderate  BAI 19/63 Moderate     Description of Test Results:  Premorbid verbal intellectual abilities were estimated to have been within the average range based on a test of word reading. Psychomotor processing speed was average. Auditory attention and working memory were low average. Visual-spatial construction was average. Language abilities were intact. Specifically, confrontation naming was high average, and semantic verbal fluency was average. With regard to verbal memory, encoding and acquisition of non-contextual information (i.e., word list) was low average. After a brief distracter task, free recall was average. After a delay, free recall was low average. Cued recall was average. Performance on a yes/no  recognition task was average. On another verbal memory test, encoding and acquisition of contextual auditory information (i.e., short story) was high average. After a delay, free recall was average. With regard to non-verbal memory, delayed free recall of visual information was borderline impaired. Executive functioning was variable. Mental flexibility and set-shifting were average on Trails B. Verbal fluency with phonemic search restrictions was low average. Verbal abstract reasoning was average. Non-verbal abstract reasoning was borderline impaired. Performance on a clock drawing task was impaired due to improper spacing, suggesting poor planning/organization. However, the time was set correctly. On self-report questionnaires, the patient's responses were  indicative of clinically significant depression and anxiety at the present time. Depressive symptoms endorsed included: moderate sadness, reduced interest, indecisiveness, increased sleep, fatigue and lowered libido. She also reported mild pessimism, anhedonia, guilty feelings, loss of self confidence, self-criticalness, tearfulness, feelings of worthlessness, loss of energy, reduced appetite, irritability, reduced concentration. She reported a severe level of generalized anxiety characterized by nervousness, inability to control worry, worrying too much about different things, difficulty relaxing, restlessness, irritability and fear of the worst happening.    Clinical Impressions: Major depressive disorder; Anxiety disorder NOS (symptoms of generalized anxiety disorder, panic disorder and possibly posttraumatic stress disorder).  Cognitive testing was within normal limits. She did demonstrate a relative weakness in non-verbal abstract reasoning, which is likely an incidental finding (perhaps longstanding) and is not impacting her to any significant degree. All other aspects of executive function were intact. Memory and learning of new information was intact  and there was no indication of hippocampal consolidation dysfunction. She did benefit from repetition and cueing. The patient endorsed symptoms consistent with major depressive disorder, which is recurrent and related to multiple losses including the recent murder of her son. She also endorsed anxiety characterized by generalized anxiety, panic attacks and symptoms of posttraumatic stress.  Based on the available data, it is most likely that the patient's cognitive symptoms in daily life are secondary to depression, anxiety and unresolved grief.    Recommendations/Plan: Based on the findings of the present evaluation, the following recommendations are offered:  1. First and foremost I am recommended more aggressive treatment of her depression/anxiety. She has not done any grief counseling. She was resistant initially but eventually agreed on a referral to see Dr. Bary Leriche in [place]. I will gladly place this referral for her. 2. Implementation of a daily routine/schedule with increased activity is recommended in order to improve mood and for overall wellness. 3. Continue to monitor effectiveness of psychotropic medications and follow up with psychiatrist/prescribing physician as necessary. 4. Cognitive re-evaluation can be completed in the future (with the current testing results providing a baseline for comparison) if there is  a change in cognition or behavior reported or observed.   Feedback to Patient: TENESHIA MARIK and her daughter returned for a feedback appointment on 08/27/2016 to review the results of her neuropsychological evaluation with this provider. 30 minutes face-to-face time was spent reviewing her test results, my impressions and my recommendations as detailed above.    Total time spent on this patient's case: [id number] unit for interview with psychologist; [phone] units of testing by psychometrician under psychologist's supervision; [phone] units for medical record review, scoring  of neuropsychological tests, interpretation of test results, preparation of this report, and review of results to the patient by psychologist.      Thank you for your referral of ANALYAH STUTTS. Please feel free to contact me if you have any questions or concerns regarding this report.

## 2016-08-28 ENCOUNTER — Encounter: Payer: Self-pay | Admitting: Psychology

## 2016-08-28 ENCOUNTER — Other Ambulatory Visit: Payer: Self-pay | Admitting: Family Medicine

## 2016-08-28 ENCOUNTER — Ambulatory Visit (INDEPENDENT_AMBULATORY_CARE_PROVIDER_SITE_OTHER): Payer: Commercial Managed Care - HMO | Admitting: Psychology

## 2016-08-28 DIAGNOSIS — R413 Other amnesia: Secondary | ICD-10-CM | POA: Diagnosis not present

## 2016-08-28 DIAGNOSIS — F32A Depression, unspecified: Secondary | ICD-10-CM

## 2016-08-28 DIAGNOSIS — F329 Major depressive disorder, single episode, unspecified: Secondary | ICD-10-CM

## 2016-08-28 DIAGNOSIS — F411 Generalized anxiety disorder: Secondary | ICD-10-CM

## 2016-08-28 DIAGNOSIS — Z634 Disappearance and death of family member: Secondary | ICD-10-CM

## 2016-08-28 MED ORDER — ALPRAZOLAM 1 MG PO TABS: 1.0000 mg | ORAL_TABLET | Freq: Three times a day (TID) | ORAL | 0 refills | Status: DC | PRN

## 2016-09-18 ENCOUNTER — Other Ambulatory Visit: Payer: Self-pay | Admitting: Family Medicine

## 2016-09-18 MED ORDER — ALPRAZOLAM 1 MG PO TABS: 1.0000 mg | ORAL_TABLET | Freq: Three times a day (TID) | ORAL | 0 refills | Status: DC | PRN

## 2016-10-05 DIAGNOSIS — Z85820 Personal history of malignant melanoma of skin: Secondary | ICD-10-CM | POA: Diagnosis not present

## 2016-10-05 DIAGNOSIS — D225 Melanocytic nevi of trunk: Secondary | ICD-10-CM | POA: Diagnosis not present

## 2016-10-05 DIAGNOSIS — D2261 Melanocytic nevi of right upper limb, including shoulder: Secondary | ICD-10-CM | POA: Diagnosis not present

## 2016-10-05 DIAGNOSIS — Z85828 Personal history of other malignant neoplasm of skin: Secondary | ICD-10-CM | POA: Diagnosis not present

## 2016-10-16 ENCOUNTER — Telehealth: Payer: Self-pay

## 2016-10-16 NOTE — Telephone Encounter (Signed)
Checked on prescription, she is not due until November 02, 2016.

## 2016-10-16 NOTE — Telephone Encounter (Signed)
She's on the 28 day list

## 2016-10-16 NOTE — Telephone Encounter (Signed)
Alprazolam 1mg  tablet  Take one tablet TID prn

## 2016-10-22 ENCOUNTER — Other Ambulatory Visit: Payer: Self-pay | Admitting: Family Medicine

## 2016-10-22 MED ORDER — ALPRAZOLAM 1 MG PO TABS: 1.0000 mg | ORAL_TABLET | Freq: Three times a day (TID) | ORAL | 0 refills | Status: DC | PRN

## 2016-11-07 ENCOUNTER — Ambulatory Visit: Payer: Medicare (Managed Care) | Admitting: Neurology

## 2016-11-14 ENCOUNTER — Telehealth: Payer: Self-pay | Admitting: Family Medicine

## 2016-11-15 MED ORDER — DICLOFENAC SODIUM 75 MG PO TBEC: 75.0000 mg | DELAYED_RELEASE_TABLET | Freq: Two times a day (BID) | ORAL | 1 refills | Status: DC

## 2016-11-15 MED ORDER — SIMVASTATIN 10 MG PO TABS: 10.0000 mg | ORAL_TABLET | Freq: Every evening | ORAL | 1 refills | Status: DC

## 2016-11-15 NOTE — Telephone Encounter (Signed)
Rx sent to her pharmacy 

## 2016-11-16 ENCOUNTER — Encounter: Payer: Self-pay | Admitting: Neurology

## 2016-11-19 ENCOUNTER — Other Ambulatory Visit: Payer: Self-pay | Admitting: Family Medicine

## 2016-11-26 ENCOUNTER — Other Ambulatory Visit: Payer: Self-pay | Admitting: Family Medicine

## 2016-11-26 MED ORDER — ALPRAZOLAM 1 MG PO TABS: 1.0000 mg | ORAL_TABLET | Freq: Three times a day (TID) | ORAL | 0 refills | Status: DC | PRN

## 2016-12-04 DIAGNOSIS — Z961 Presence of intraocular lens: Secondary | ICD-10-CM | POA: Diagnosis not present

## 2016-12-04 LAB — HM DIABETES EYE EXAM

## 2016-12-14 ENCOUNTER — Ambulatory Visit (INDEPENDENT_AMBULATORY_CARE_PROVIDER_SITE_OTHER): Payer: Commercial Managed Care - HMO | Admitting: Family Medicine

## 2016-12-14 ENCOUNTER — Encounter: Payer: Self-pay | Admitting: Family Medicine

## 2016-12-14 VITALS — BP 141/93 | HR 85 | Temp 98.6°F | Wt 228.4 lb

## 2016-12-14 DIAGNOSIS — E1342 Other specified diabetes mellitus with diabetic polyneuropathy: Secondary | ICD-10-CM

## 2016-12-14 DIAGNOSIS — Z23 Encounter for immunization: Secondary | ICD-10-CM

## 2016-12-14 DIAGNOSIS — R300 Dysuria: Secondary | ICD-10-CM

## 2016-12-14 DIAGNOSIS — N182 Chronic kidney disease, stage 2 (mild): Secondary | ICD-10-CM | POA: Diagnosis not present

## 2016-12-14 DIAGNOSIS — D696 Thrombocytopenia, unspecified: Secondary | ICD-10-CM | POA: Diagnosis not present

## 2016-12-14 DIAGNOSIS — N3000 Acute cystitis without hematuria: Secondary | ICD-10-CM

## 2016-12-14 DIAGNOSIS — F411 Generalized anxiety disorder: Secondary | ICD-10-CM | POA: Diagnosis not present

## 2016-12-14 DIAGNOSIS — E782 Mixed hyperlipidemia: Secondary | ICD-10-CM | POA: Diagnosis not present

## 2016-12-14 DIAGNOSIS — E1122 Type 2 diabetes mellitus with diabetic chronic kidney disease: Secondary | ICD-10-CM

## 2016-12-14 DIAGNOSIS — I10 Essential (primary) hypertension: Secondary | ICD-10-CM | POA: Insufficient documentation

## 2016-12-14 DIAGNOSIS — F332 Major depressive disorder, recurrent severe without psychotic features: Secondary | ICD-10-CM | POA: Diagnosis not present

## 2016-12-14 DIAGNOSIS — K219 Gastro-esophageal reflux disease without esophagitis: Secondary | ICD-10-CM | POA: Diagnosis not present

## 2016-12-14 MED ORDER — NITROFURANTOIN MONOHYD MACRO 100 MG PO CAPS: 100.0000 mg | ORAL_CAPSULE | Freq: Two times a day (BID) | ORAL | 0 refills | Status: DC

## 2016-12-14 MED ORDER — SIMVASTATIN 10 MG PO TABS: 10.0000 mg | ORAL_TABLET | Freq: Every evening | ORAL | 1 refills | Status: DC

## 2016-12-14 MED ORDER — OMEPRAZOLE 20 MG PO CPDR: 20.0000 mg | DELAYED_RELEASE_CAPSULE | Freq: Every day | ORAL | 1 refills | Status: DC

## 2016-12-14 MED ORDER — LISINOPRIL 5 MG PO TABS: 5.0000 mg | ORAL_TABLET | Freq: Every day | ORAL | 1 refills | Status: DC

## 2016-12-14 MED ORDER — QUETIAPINE FUMARATE 25 MG PO TABS: 25.0000 mg | ORAL_TABLET | Freq: Every day | ORAL | 1 refills | Status: DC

## 2016-12-14 MED ORDER — VENLAFAXINE HCL ER 150 MG PO CP24: ORAL_CAPSULE | ORAL | 1 refills | Status: DC

## 2016-12-14 MED ORDER — METFORMIN HCL 1000 MG PO TABS: 500.0000 mg | ORAL_TABLET | Freq: Two times a day (BID) | ORAL | 1 refills | Status: DC

## 2016-12-14 MED ORDER — GABAPENTIN 100 MG PO CAPS: 100.0000 mg | ORAL_CAPSULE | Freq: Two times a day (BID) | ORAL | 1 refills | Status: DC

## 2016-12-14 NOTE — Assessment & Plan Note (Signed)
Rechecking labs. Continue current regimen. Continue to monitor. Call with any concerns.

## 2016-12-14 NOTE — Patient Instructions (Signed)

## 2016-12-14 NOTE — Assessment & Plan Note (Signed)
Not under great control. Will increase her gabapentin to 400mg  BID. Recheck 1 month.

## 2016-12-14 NOTE — Assessment & Plan Note (Signed)
Stable. Continue current regimen. Continue to monitor. Call with any concerns.  

## 2016-12-14 NOTE — Progress Notes (Signed)
BP (!) 141/93 (BP Location: Left Arm, Cuff Size: Large)   Pulse 85   Temp 98.6 F (37 C)   Wt 228 lb 6.4 oz (103.6 kg)   SpO2 92%   BMI 36.86 kg/m    Subjective:    Patient ID: Dawn Moore, female    DOB: 1941-03-20, 75 y.o.   MRN: [id number]  HPI: Dawn Moore is a 75 y.o. female  Chief Complaint  Patient presents with  . Diabetes   HYPERTENSION / HYPERLIPIDEMIA Satisfied with current treatment? yes Duration of hypertension: chronic BP monitoring frequency: not checking BP range:  BP medication side effects: no Duration of hyperlipidemia: chronic Cholesterol medication side effects: no Cholesterol supplements: none Past cholesterol medications: simvastatin (zocor) Medication compliance: excellent compliance Aspirin: yes Recent stressors: yes Recurrent headaches: yes Visual changes: yes Palpitations: no Dyspnea: no Chest pain: no Lower extremity edema: no Dizzy/lightheaded: no  DIABETES Hypoglycemic episodes:no Polydipsia/polyuria: yes Visual disturbance: yes Chest pain: no Paresthesias: yes Glucose Monitoring: no Taking Insulin?: no Blood Pressure Monitoring: not checking Retinal Examination: Up to Date Foot Exam: Up to Date Diabetic Education: Completed Pneumovax: Up to Date Influenza: Up to Date Aspirin: yes  ANXIETY/STRESS Duration:better Anxious mood: yes  Excessive worrying: yes Irritability: no  Sweating: no Nausea: no Palpitations:no Hyperventilation: no Panic attacks: no Agoraphobia: no  Obscessions/compulsions: no Depressed mood: yes Depression screen [facility] 2/9 07/24/2016 07/02/2016 06/15/2016 03/23/2016 02/27/2016  Decreased Interest 1 2 3 3 2   Down, Depressed, Hopeless 1 2 3 3 2   PHQ - 2 Score 2 4 6 6 4   Altered sleeping - 3 - 3 2  Tired, decreased energy - 2 - 3 2  Change in appetite - 0 - 3 3  Feeling bad or failure about yourself  - 2 - 3 2  Trouble concentrating - 2 - 3 2  Moving slowly or fidgety/restless - 0 - 0 2  Suicidal  thoughts - 0 - 0 2  PHQ-9 Score - 13 - 21 19  Difficult doing work/chores - Very difficult - Very difficult Very difficult   Anhedonia: no Weight changes: no Insomnia: no   Hypersomnia: yes Fatigue/loss of energy: yes Feelings of worthlessness: yes Feelings of guilt: yes Impaired concentration/indecisiveness: yes Suicidal ideations: no  Crying spells: yes Recent Stressors/Life Changes: yes  Relevant past medical, surgical, family and social history reviewed and updated as indicated. Interim medical history since our last visit reviewed. Allergies and medications reviewed and updated.  Review of Systems  Constitutional: Negative.   Respiratory: Negative.   Cardiovascular: Negative.   Musculoskeletal: Negative.   Psychiatric/Behavioral: Negative.     Per HPI unless specifically indicated above     Objective:    BP (!) 141/93 (BP Location: Left Arm, Cuff Size: Large)   Pulse 85   Temp 98.6 F (37 C)   Wt 228 lb 6.4 oz (103.6 kg)   SpO2 92%   BMI 36.86 kg/m   Wt Readings from Last 3 Encounters:  12/14/16 228 lb 6.4 oz (103.6 kg)  08/06/16 226 lb (102.5 kg)  07/24/16 227 lb (103 kg)    Physical Exam  Constitutional: She is oriented to person, place, and time. She appears well-developed and well-nourished. No distress.  HENT:  Head: Normocephalic and atraumatic.  Right Ear: Hearing normal.  Left Ear: Hearing normal.  Nose: Nose normal.  Eyes: Conjunctivae and lids are normal. Right eye exhibits no discharge. Left eye exhibits no discharge. No scleral icterus.  Cardiovascular: Normal rate, regular  rhythm, normal heart sounds and intact distal pulses.  Exam reveals no gallop and no friction rub.   No murmur heard. Pulmonary/Chest: Effort normal and breath sounds normal. No respiratory distress. She has no wheezes. She has no rales. She exhibits no tenderness.  Musculoskeletal: Normal range of motion.  Neurological: She is alert and oriented to person, place, and time.   Skin: Skin is warm, dry and intact. No rash noted. She is not diaphoretic. No erythema. No pallor.  Psychiatric: She has a normal mood and affect. Her speech is normal and behavior is normal. Judgment and thought content normal. Cognition and memory are normal.  Nursing note and vitals reviewed.   Results for orders placed or performed in visit on 12/05/16  HM DIABETES EYE EXAM  Result Value Ref Range   HM Diabetic Eye Exam No Retinopathy No Retinopathy      Assessment & Plan:   Problem List Items Addressed This Visit      Cardiovascular and Mediastinum   HTN (hypertension)    Will start her on low dose lisinopril. Recheck 1 month.       Relevant Medications   simvastatin (ZOCOR) 10 MG tablet   lisinopril (PRINIVIL,ZESTRIL) 5 MG tablet     Digestive   GERD (gastroesophageal reflux disease)    Stable. Continue current regimen. Continue to monitor. Call with any concerns.       Relevant Medications   omeprazole (PRILOSEC) 20 MG capsule     Endocrine   Diabetes mellitus with renal manifestations, controlled ([place]) - Primary   Relevant Medications   simvastatin (ZOCOR) 10 MG tablet   metFORMIN (GLUCOPHAGE) 1000 MG tablet   lisinopril (PRINIVIL,ZESTRIL) 5 MG tablet   Other Relevant Orders   Bayer DCA Hb A1c Waived   Comprehensive metabolic panel   Diabetic neuropathy ([place])    Not under great control. Will increase her gabapentin to 400mg  BID. Recheck 1 month.       Relevant Medications   simvastatin (ZOCOR) 10 MG tablet   metFORMIN (GLUCOPHAGE) 1000 MG tablet   lisinopril (PRINIVIL,ZESTRIL) 5 MG tablet     Other   Anxiety disorder    Starting to do a bit better. Continue current regimen. Continue to monitor. Call with any concerns.       Thrombocytopenia ([facility])    Rechecking CBC. Await results.       Relevant Orders   CBC with Differential/Platelet   Comprehensive metabolic panel   Depression    Starting to do a bit better. Continue current regimen. Continue  to monitor. Call with any concerns.       Relevant Medications   venlafaxine XR (EFFEXOR-XR) 150 MG 24 hr capsule   Hyperlipidemia    Rechecking labs. Continue current regimen. Continue to monitor. Call with any concerns.       Relevant Medications   simvastatin (ZOCOR) 10 MG tablet   lisinopril (PRINIVIL,ZESTRIL) 5 MG tablet   Other Relevant Orders   Comprehensive metabolic panel   Lipid Panel w/o Chol/HDL Ratio    Other Visit Diagnoses    Dysuria       +UA today   Relevant Orders   UA/M w/rflx Culture, Routine   Immunization due       Flu shot given today   Relevant Orders   Flu vaccine HIGH DOSE PF (Fluzone High dose) (Completed)   Acute cystitis without hematuria       Will treat with nitrofurantoin. Will return after finishing her medicine to confirm resolution.  Relevant Orders   UA/M w/rflx Culture, Routine       Follow up plan: Return in about 4 weeks (around 01/11/2017) for BP follow up.

## 2016-12-14 NOTE — Assessment & Plan Note (Signed)
Rechecking CBC. Await results.

## 2016-12-14 NOTE — Assessment & Plan Note (Signed)
Starting to do a bit better. Continue current regimen. Continue to monitor. Call with any concerns.

## 2016-12-14 NOTE — Assessment & Plan Note (Signed)
Will start her on low dose lisinopril. Recheck 1 month.

## 2016-12-15 LAB — COMPREHENSIVE METABOLIC PANEL
ALBUMIN: 4.1 g/dL (ref 3.5–4.8)
ALK PHOS: 95 IU/L (ref 39–117)
ALT: 24 IU/L (ref 0–32)
AST: 39 IU/L (ref 0–40)
Albumin/Globulin Ratio: 1.5 (ref 1.2–2.2)
BUN / CREAT RATIO: 12 (ref 12–28)
BUN: 13 mg/dL (ref 8–27)
Bilirubin Total: 0.6 mg/dL (ref 0.0–1.2)
CO2: 24 mmol/L (ref 18–29)
CREATININE: 1.06 mg/dL — AB (ref 0.57–1.00)
Calcium: 9.4 mg/dL (ref 8.7–10.3)
Chloride: 101 mmol/L (ref 96–106)
GFR calc Af Amer: 60 mL/min/{1.73_m2} (ref >59)
GFR calc non Af Amer: 52 mL/min/{1.73_m2} — ABNORMAL LOW (ref >59)
GLUCOSE: 139 mg/dL — AB (ref 65–99)
Globulin, Total: 2.8 g/dL (ref 1.5–4.5)
Potassium: 4.9 mmol/L (ref 3.5–5.2)
Sodium: 143 mmol/L (ref 134–144)
Total Protein: 6.9 g/dL (ref 6.0–8.5)

## 2016-12-15 LAB — CBC WITH DIFFERENTIAL/PLATELET
BASOS ABS: 0 10*3/uL (ref 0.0–0.2)
Basos: 0 %
EOS (ABSOLUTE): 0.1 10*3/uL (ref 0.0–0.4)
Eos: 2 %
HEMOGLOBIN: 11.8 g/dL (ref 11.1–15.9)
Hematocrit: 36.9 % (ref 34.0–46.6)
Immature Grans (Abs): 0 10*3/uL (ref 0.0–0.1)
Immature Granulocytes: 0 %
LYMPHS ABS: 1.7 10*3/uL (ref 0.7–3.1)
LYMPHS: 38 %
MCH: 26.7 pg (ref 26.6–33.0)
MCHC: 32 g/dL (ref 31.5–35.7)
MCV: 84 fL (ref 79–97)
MONOCYTES: 7 %
Monocytes Absolute: 0.3 10*3/uL (ref 0.1–0.9)
NEUTROS PCT: 53 %
Neutrophils Absolute: 2.4 10*3/uL (ref 1.4–7.0)
Platelets: 111 10*3/uL — ABNORMAL LOW (ref 150–379)
RBC: 4.42 x10E6/uL (ref 3.77–5.28)
RDW: 16.9 % — ABNORMAL HIGH (ref 12.3–15.4)
WBC: 4.5 10*3/uL (ref 3.4–10.8)

## 2016-12-15 LAB — LIPID PANEL W/O CHOL/HDL RATIO
CHOLESTEROL TOTAL: 130 mg/dL (ref 100–199)
HDL: 38 mg/dL — AB (ref >39)
LDL CALC: 58 mg/dL (ref 0–99)
Triglycerides: 169 mg/dL — ABNORMAL HIGH (ref 0–149)
VLDL CHOLESTEROL CAL: 34 mg/dL (ref 5–40)

## 2016-12-16 LAB — UA/M W/RFLX CULTURE, ROUTINE
Bilirubin, UA: NEGATIVE
Glucose, UA: NEGATIVE
Nitrite, UA: NEGATIVE
Specific Gravity, UA: 1.025 (ref 1.005–1.030)
Urobilinogen, Ur: 0.2 mg/dL (ref 0.2–1.0)
pH, UA: 5.5 (ref 5.0–7.5)

## 2016-12-16 LAB — URINE CULTURE, REFLEX

## 2016-12-16 LAB — MICROSCOPIC EXAMINATION
Epithelial Cells (non renal): >10 /HPF — AB
RBC, UA: NONE SEEN /HPF
WBC, UA: >30 /HPF — AB

## 2016-12-16 LAB — BAYER DCA HB A1C WAIVED: HB A1C (BAYER DCA - WAIVED): 6.3 % (ref <7.0)

## 2016-12-17 ENCOUNTER — Encounter: Payer: Self-pay | Admitting: Family Medicine

## 2016-12-17 ENCOUNTER — Other Ambulatory Visit: Payer: Self-pay | Admitting: Family Medicine

## 2016-12-17 MED ORDER — ALPRAZOLAM 1 MG PO TABS: 1.0000 mg | ORAL_TABLET | Freq: Three times a day (TID) | ORAL | 0 refills | Status: DC | PRN

## 2016-12-19 ENCOUNTER — Other Ambulatory Visit: Payer: Self-pay

## 2016-12-19 ENCOUNTER — Telehealth: Payer: Self-pay | Admitting: Family Medicine

## 2016-12-19 ENCOUNTER — Other Ambulatory Visit: Payer: Commercial Managed Care - HMO

## 2016-12-19 DIAGNOSIS — N3000 Acute cystitis without hematuria: Secondary | ICD-10-CM

## 2016-12-19 MED ORDER — CIPROFLOXACIN HCL 250 MG PO TABS: 250.0000 mg | ORAL_TABLET | Freq: Two times a day (BID) | ORAL | 0 refills | Status: DC

## 2016-12-19 NOTE — Telephone Encounter (Signed)
Looks like she needs something else.  I will call in Cipro

## 2016-12-19 NOTE — Telephone Encounter (Signed)
Patient came by and left urine sample. 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 Lab result on my desk.

## 2016-12-19 NOTE — Telephone Encounter (Signed)
Routing to provider  

## 2016-12-19 NOTE — Telephone Encounter (Signed)
Patient was diagnosed with UTI last Friday and has been taking medication for this but her daughter does not seem to think she is improving.  Her balance is off with other issues.  She would like to bring the patient in for another urinalysis if possible today.    Thank You Santiago Glad

## 2016-12-19 NOTE — Telephone Encounter (Signed)
Patient's daughter notified.

## 2016-12-26 ENCOUNTER — Telehealth: Payer: Self-pay | Admitting: Family Medicine

## 2016-12-26 ENCOUNTER — Other Ambulatory Visit: Payer: Self-pay | Admitting: Family Medicine

## 2016-12-26 LAB — UA/M W/RFLX CULTURE, ROUTINE
Bilirubin, UA: NEGATIVE
Glucose, UA: NEGATIVE
Nitrite, UA: NEGATIVE
PH UA: 5.5 (ref 5.0–7.5)
RBC, UA: NEGATIVE
Specific Gravity, UA: 1.025 (ref 1.005–1.030)
Urobilinogen, Ur: 1 mg/dL (ref 0.2–1.0)

## 2016-12-26 LAB — MICROSCOPIC EXAMINATION
RBC MICROSCOPIC, UA: NONE SEEN /HPF (ref 0–?)
WBC, UA: >30 /hpf — ABNORMAL HIGH (ref 0–?)

## 2016-12-26 LAB — URINE CULTURE, REFLEX: Organism ID, Bacteria: NO GROWTH

## 2016-12-26 NOTE — Telephone Encounter (Signed)
Patient's daughter notified.

## 2016-12-26 NOTE — Telephone Encounter (Signed)
Last OV: 12/14/16 Next OV: 01/14/17

## 2016-12-26 NOTE — Telephone Encounter (Signed)
Please let them know that her urine didn't grow out any bacteria.

## 2017-01-02 ENCOUNTER — Other Ambulatory Visit: Payer: Self-pay | Admitting: Family Medicine

## 2017-01-03 ENCOUNTER — Other Ambulatory Visit: Payer: Medicare HMO

## 2017-01-03 DIAGNOSIS — N390 Urinary tract infection, site not specified: Secondary | ICD-10-CM | POA: Diagnosis not present

## 2017-01-04 ENCOUNTER — Other Ambulatory Visit: Payer: Medicare HMO

## 2017-01-04 ENCOUNTER — Telehealth: Payer: Self-pay | Admitting: Family Medicine

## 2017-01-04 MED ORDER — CIPROFLOXACIN HCL 250 MG PO TABS: 250.0000 mg | ORAL_TABLET | Freq: Two times a day (BID) | ORAL | 0 refills | Status: DC

## 2017-01-04 NOTE — Telephone Encounter (Signed)
Please let her know that her urine did grow out some bacteria, so I sent her medicine to the pharmacy. Have a good trip!

## 2017-01-04 NOTE — Telephone Encounter (Signed)
Left message for patient on personalized voicemail.

## 2017-01-06 LAB — MICROSCOPIC EXAMINATION: RBC MICROSCOPIC, UA: NONE SEEN /HPF (ref 0–?)

## 2017-01-06 LAB — UA/M W/RFLX CULTURE, ROUTINE
Bilirubin, UA: NEGATIVE
Glucose, UA: NEGATIVE
Ketones, UA: NEGATIVE
NITRITE UA: NEGATIVE
PH UA: 5.5 (ref 5.0–7.5)
Protein, UA: NEGATIVE
RBC, UA: NEGATIVE
Specific Gravity, UA: 1.025 (ref 1.005–1.030)
Urobilinogen, Ur: 0.2 mg/dL (ref 0.2–1.0)

## 2017-01-06 LAB — URINE CULTURE, REFLEX

## 2017-01-11 NOTE — Telephone Encounter (Signed)
Complete

## 2017-01-14 ENCOUNTER — Encounter: Payer: Medicare HMO | Admitting: Family Medicine

## 2017-01-14 NOTE — Progress Notes (Deleted)
There were no vitals taken for this visit.   Subjective:    Patient ID: Dawn Moore, female    DOB: September 18, 1941, 76 y.o.   MRN: [id number]  HPI: Dawn Moore is a 76 y.o. female  No chief complaint on file.  HYPERTENSION Hypertension status: {Blank single:19197::"controlled","uncontrolled","better","worse","exacerbated","stable"}  Satisfied with current treatment? {Blank single:19197::"yes","no"} Duration of hypertension: {Blank single:19197::"chronic","months","years"} BP monitoring frequency:  {Blank single:19197::"not checking","rarely","daily","weekly","monthly","a few times a day","a few times a week","a few times a month"} BP range:  BP medication side effects:  {Blank single:19197::"yes","no"} Medication compliance: {Blank single:19197::"excellent compliance","good compliance","fair compliance","poor compliance"} Previous BP meds:{Blank [id number] (bystolic)","carvedilol","chlorthalidone","clonidine","diltiazem","exforge HCT","HCTZ","irbesartan (avapro)","labetalol","lisinopril","lisinopril-HCTZ","losartan (cozaar)","methyldopa","nifedipine","olmesartan (benicar)","olmesartan-HCTZ","quinapril","ramipril","spironalactone","tekturna","valsartan","valsartan-HCTZ","verapamil"} Aspirin: {Blank single:19197::"yes","no"} Recurrent headaches: {Blank single:19197::"yes","no"} Visual changes: {Blank single:19197::"yes","no"} Palpitations: {Blank single:19197::"yes","no"} Dyspnea: {Blank single:19197::"yes","no"} Chest pain: {Blank single:19197::"yes","no"} Lower extremity edema: {Blank single:19197::"yes","no"} Dizzy/lightheaded: {Blank single:19197::"yes","no"}  NEUROPATHY Neuropathy status: {Blank single:19197::"controlled","uncontrolled","better","worse","exacerbated","stable"}  Satisfied with current treatment?: {Blank single:19197::"yes","no"} Medication side effects: {Blank  single:19197::"yes","no"} Medication compliance:  {Blank single:19197::"excellent compliance","good compliance","fair compliance","poor compliance"} Location:  Pain: {Blank single:19197::"yes","no"} Severity: {Blank single:19197::"mild","moderate","severe","1/10","2/10","3/10","4/10","5/10","6/10","7/10","8/10","9/10","10/10"}  Quality:  {Blank multiple:19196::"sharp","dull","aching","burning","cramping","ill-defined","itchy","pressure-like","pulling","shooting","sore","stabbing","tender","tearing","throbbing","tingling","creeping","pins and needles"} Frequency: {Blank single:19197::"constant","intermittent","occasional","rare","every few minutes","a few times a hour","a few times a day","a few times a week","a few times a month","a few times a year"} Bilateral: {Blank single:19197::"yes","no"} Symmetric: {Blank single:19197::"yes","no"} Numbness: {Blank single:19197::"yes","no"} Decreased sensation: {Blank single:19197::"yes","no"} Weakness: {Blank single:19197::"yes","no"} Context: {Blank multiple:19196::"better","worse","stable","fluctuating"}H6 Alleviating factors:  Aggravating factors:  Treatments attempted:  Relevant past medical, surgical, family and social history reviewed and updated as indicated. Interim medical history since our last visit reviewed. Allergies and medications reviewed and updated.  Review of Systems  Per HPI unless specifically indicated above     Objective:    There were no vitals taken for this visit.  Wt Readings from Last 3 Encounters:  12/14/16 228 lb 6.4 oz (103.6 kg)  08/06/16 226 lb (102.5 kg)  07/24/16 227 lb (103 kg)    Physical Exam  Results for orders placed or performed in visit on 01/03/17  Microscopic Examination  Result Value Ref Range   WBC, UA 11-30 (A) 0 - 5 /hpf   RBC, UA None seen 0 - 2 /hpf   Epithelial Cells (non renal) 0-10 0 - 10 /hpf   Renal Epithel, UA 0-10 (A) None seen /hpf   Crystals Present (A) N/A   Crystal Type  Calcium Oxalate N/A   Mucus, UA Present (A) Not Estab.   Bacteria, UA Moderate (A) None seen/Few  UA/M w/rflx Culture, Routine  Result Value Ref Range   Specific Gravity, UA 1.025 1.005 - 1.030   pH, UA 5.5 5.0 - 7.5   Color, UA Yellow Yellow   Appearance Ur Cloudy (A) Clear   Leukocytes, UA 2+ (A) Negative   Protein, UA Negative Negative/Trace   Glucose, UA Negative Negative   Ketones, UA Negative Negative   RBC, UA Negative Negative   Bilirubin, UA Negative Negative   Urobilinogen, Ur 0.2 0.2 - 1.0 mg/dL   Nitrite, UA Negative Negative   Microscopic Examination See below:    Urinalysis Reflex Comment   Urine Culture, Routine  Result Value Ref Range   Urine Culture, Routine Final report    Urine Culture result 1 Comment       Assessment & Plan:   Problem List Items Addressed This Visit      Cardiovascular and Mediastinum   HTN (hypertension) - Primary     Endocrine   Diabetic neuropathy ([place])       Follow up plan: No Follow-up on file.

## 2017-01-15 NOTE — Progress Notes (Signed)
This encounter was created in error - please disregard.

## 2017-01-25 ENCOUNTER — Ambulatory Visit (INDEPENDENT_AMBULATORY_CARE_PROVIDER_SITE_OTHER): Payer: Medicare HMO | Admitting: Family Medicine

## 2017-01-25 ENCOUNTER — Encounter: Payer: Self-pay | Admitting: Family Medicine

## 2017-01-25 VITALS — BP 133/71 | HR 100 | Temp 98.1°F | Wt 233.5 lb

## 2017-01-25 DIAGNOSIS — R404 Transient alteration of awareness: Secondary | ICD-10-CM

## 2017-01-25 DIAGNOSIS — M2550 Pain in unspecified joint: Secondary | ICD-10-CM | POA: Diagnosis not present

## 2017-01-25 DIAGNOSIS — N390 Urinary tract infection, site not specified: Secondary | ICD-10-CM | POA: Diagnosis not present

## 2017-01-25 DIAGNOSIS — I10 Essential (primary) hypertension: Secondary | ICD-10-CM

## 2017-01-25 MED ORDER — NAPROXEN 500 MG PO TABS: 500.0000 mg | ORAL_TABLET | Freq: Two times a day (BID) | ORAL | 1 refills | Status: DC

## 2017-01-25 MED ORDER — ALPRAZOLAM 1 MG PO TABS: 1.0000 mg | ORAL_TABLET | Freq: Three times a day (TID) | ORAL | 0 refills | Status: DC | PRN

## 2017-01-25 NOTE — Progress Notes (Signed)
BP 133/71 (BP Location: Left Arm, Patient Position: Sitting, Cuff Size: Large)   Pulse 100   Temp 98.1 F (36.7 C)   Wt 233 lb 8 oz (105.9 kg)   SpO2 92%   BMI 37.69 kg/m    Subjective:    Patient ID: Dawn Moore, female    DOB: 1941-10-15, 76 y.o.   MRN: [id number]  HPI: Dawn Moore is a 76 y.o. female  Chief Complaint  Patient presents with  . Hypertension  . Urinary Tract Infection  . Knee Pain    right knee  . Foot Pain    Bilateral  . memory issue    Patients daughter states that she has taken her evening medication in the morning a couple of times, patient is concerned that she may be developing dementia   HYPERTENSION Hypertension status: better  Satisfied with current treatment? yes Duration of hypertension: chronic BP monitoring frequency:  not checking BP medication side effects:  no Medication compliance: excellent compliance Aspirin: no Recurrent headaches: no Visual changes: no Palpitations: no Dyspnea: no Chest pain: no Lower extremity edema: no Dizzy/lightheaded: no   ARTHRALGIAS / JOINT ACHES Duration: Chronic Pain: yes Symmetric: no Severity: severe Quality: dull and aching Frequency: constant Context:  better Decreased function/range of motion: yes  Erythema: no Swelling: yes Heat or warmth: no Morning stiffness: yes Relief with NSAIDs?: No NSAIDs Taken Treatments attempted:  rest, ice and heat  Involved Joints:     Hands: yes bilateral    Wrists: no      Elbows: no     Shoulders: yes  bilateral    Back: no     Hips: yes bilateral    Knees: yes right    Ankles: no     Feet: yes bilateral  Concern about her memory- Daughter thinks that it is due to ?UTI. Not as bad as it has been, but not good either.  Relevant past medical, surgical, family and social history reviewed and updated as indicated. Interim medical history since our last visit reviewed. Allergies and medications reviewed and updated.  Review of Systems    Constitutional: Negative.   Respiratory: Negative.   Cardiovascular: Negative.   Musculoskeletal: Positive for arthralgias, joint swelling and myalgias. Negative for back pain, gait problem, neck pain and neck stiffness.  Skin: Negative.   Psychiatric/Behavioral: Positive for confusion and dysphoric mood. Negative for agitation, behavioral problems, decreased concentration, hallucinations, self-injury, sleep disturbance and suicidal ideas. The patient is not nervous/anxious and is not hyperactive.     Per HPI unless specifically indicated above     Objective:    BP 133/71 (BP Location: Left Arm, Patient Position: Sitting, Cuff Size: Large)   Pulse 100   Temp 98.1 F (36.7 C)   Wt 233 lb 8 oz (105.9 kg)   SpO2 92%   BMI 37.69 kg/m   Wt Readings from Last 3 Encounters:  01/25/17 233 lb 8 oz (105.9 kg)  12/14/16 228 lb 6.4 oz (103.6 kg)  08/06/16 226 lb (102.5 kg)    Physical Exam  Constitutional: She is oriented to person, place, and time. She appears well-developed and well-nourished. No distress.  HENT:  Head: Normocephalic and atraumatic.  Right Ear: Hearing normal.  Left Ear: Hearing normal.  Nose: Nose normal.  Eyes: Conjunctivae and lids are normal. Right eye exhibits no discharge. Left eye exhibits no discharge. No scleral icterus.  Cardiovascular: Normal rate, regular rhythm, normal heart sounds and intact distal pulses.  Exam reveals  no gallop and no friction rub.   No murmur heard. Pulmonary/Chest: Effort normal and breath sounds normal. No respiratory distress. She has no wheezes. She has no rales. She exhibits no tenderness.  Musculoskeletal:  Effusion R knee, swelling CMPs bilaterally  Neurological: She is alert and oriented to person, place, and time.  Skin: Skin is warm, dry and intact. No rash noted. She is not diaphoretic. No erythema.  Psychiatric: She has a normal mood and affect. Her speech is normal and behavior is normal. Judgment and thought content  normal. Cognition and memory are normal.  Nursing note and vitals reviewed.   Results for orders placed or performed in visit on 01/25/17  Rheumatoid Factor  Result Value Ref Range   Rhuematoid fact SerPl-aCnc <10.0 0.0 - 13.9 IU/mL  C-reactive protein  Result Value Ref Range   CRP 4.5 0.0 - 4.9 mg/L  Sed Rate (ESR)  Result Value Ref Range   Sed Rate 12 0 - 40 mm/hr  Antinuclear Antib (ANA)  Result Value Ref Range   Anit Nuclear Antibody(ANA) Negative Negative  CYCLIC CITRUL PEPTIDE ANTIBODY, IGG/IGA  Result Value Ref Range   Cyclic Citrullin Peptide Ab 4 0 - 19 units      Assessment & Plan:   Problem List Items Addressed This Visit      Cardiovascular and Mediastinum   HTN (hypertension) - Primary    Under good control today. Continue current regimen. Continue to monitor. Call with any concerns.         Other   Transient alteration of awareness    Likely multifactorial and in part due to medications. Will await and treat possible UTI- if not getting better will need to see neurology for evaluation.        Other Visit Diagnoses    Urinary tract infection without hematuria, site unspecified       Unable to leave a urine today. Will return with urine on Monday. Await results.    Relevant Orders   UA/M w/rflx Culture, Routine   Arthralgia, unspecified joint       Sister has RA- she would like to be checked for it. This is likely OA. Await results.    Relevant Orders   Rheumatoid Factor (Completed)   C-reactive protein (Completed)   Sed Rate (ESR) (Completed)   Antinuclear Antib (ANA) (Completed)   CYCLIC CITRUL PEPTIDE ANTIBODY, IGG/IGA (Completed)       Follow up plan: Return in about 4 weeks (around 02/22/2017).

## 2017-01-26 LAB — ANA: Anti Nuclear Antibody(ANA): NEGATIVE

## 2017-01-26 LAB — RHEUMATOID FACTOR: Rhuematoid fact SerPl-aCnc: <10 IU/mL (ref 0.0–13.9)

## 2017-01-26 LAB — C-REACTIVE PROTEIN: CRP: 4.5 mg/L (ref 0.0–4.9)

## 2017-01-26 LAB — SEDIMENTATION RATE: SED RATE: 12 mm/h (ref 0–40)

## 2017-01-27 LAB — CYCLIC CITRUL PEPTIDE ANTIBODY, IGG/IGA: Cyclic Citrullin Peptide Ab: 4 units (ref 0–19)

## 2017-01-28 ENCOUNTER — Other Ambulatory Visit: Payer: Self-pay

## 2017-01-28 ENCOUNTER — Telehealth: Payer: Self-pay

## 2017-01-28 ENCOUNTER — Telehealth: Payer: Self-pay | Admitting: Family Medicine

## 2017-01-28 DIAGNOSIS — R300 Dysuria: Secondary | ICD-10-CM | POA: Diagnosis not present

## 2017-01-28 DIAGNOSIS — N390 Urinary tract infection, site not specified: Secondary | ICD-10-CM

## 2017-01-28 NOTE — Assessment & Plan Note (Signed)
Likely multifactorial and in part due to medications. Will await and treat possible UTI- if not getting better will need to see neurology for evaluation.

## 2017-01-28 NOTE — Telephone Encounter (Signed)
Patient requested when her urine results come back her daughter be notified. [phone]

## 2017-01-28 NOTE — Assessment & Plan Note (Signed)
Under good control today. Continue current regimen. Continue to monitor. Call with any concerns.  

## 2017-01-28 NOTE — Telephone Encounter (Signed)
Patient's daughter notified.

## 2017-01-28 NOTE — Telephone Encounter (Signed)
Please let her know that she tested negative for any sign of rheumatoid arthritis.

## 2017-01-30 ENCOUNTER — Telehealth: Payer: Self-pay | Admitting: Family Medicine

## 2017-01-30 LAB — MICROSCOPIC EXAMINATION

## 2017-01-30 LAB — UA/M W/RFLX CULTURE, ROUTINE
BILIRUBIN UA: NEGATIVE
Glucose, UA: NEGATIVE
KETONES UA: NEGATIVE
Nitrite, UA: NEGATIVE
PH UA: 5.5 (ref 5.0–7.5)
Protein, UA: NEGATIVE
RBC UA: NEGATIVE
Specific Gravity, UA: 1.025 (ref 1.005–1.030)
Urobilinogen, Ur: 0.2 mg/dL (ref 0.2–1.0)

## 2017-01-30 LAB — URINE CULTURE, REFLEX: ORGANISM ID, BACTERIA: NO GROWTH

## 2017-01-30 NOTE — Telephone Encounter (Signed)
Patient's daughter notified.

## 2017-01-30 NOTE — Telephone Encounter (Signed)
Please let her daughter know that her urine did not grow out any bacteria, so no infection. Thanks!

## 2017-02-08 ENCOUNTER — Telehealth: Payer: Self-pay | Admitting: Family Medicine

## 2017-02-08 NOTE — Telephone Encounter (Signed)
LMOM to advise Ms. Inghram that [facility] does not pay for T-DAP unless it is needed due to an injury.

## 2017-02-15 ENCOUNTER — Telehealth: Payer: Self-pay | Admitting: Family Medicine

## 2017-02-15 NOTE — Telephone Encounter (Signed)
Dawn Moore called and stated that she would like to speak with Dr Wynetta Emery about the pts gabapentin (NEURONTIN) 100 MG capsule about side effects.

## 2017-02-15 NOTE — Telephone Encounter (Signed)
Have her stop the gabapentin until Tuesday and I'll call her on Tuesday to see if she's better and we still need it.

## 2017-02-15 NOTE — Telephone Encounter (Signed)
Called and let Lattie Haw know what Dr. Wynetta Emery said. Will route back to provider for phone call on Tuesday.

## 2017-02-15 NOTE — Telephone Encounter (Signed)
Called and spoke with Dawn Moore. She states that she heard that a side effect of gabapentin was slurred speech, dry mouth, trouble speaking, and some confusion. Dawn Moore wants to know if this medication could be causing these things to be going on with her mom and if these really are side effects to this medication. Dawn Moore said that if these really are side effects, then she does not want her mom taking this medication.

## 2017-02-20 ENCOUNTER — Other Ambulatory Visit: Payer: Self-pay | Admitting: Family Medicine

## 2017-02-21 ENCOUNTER — Other Ambulatory Visit: Payer: Self-pay | Admitting: Family Medicine

## 2017-02-25 ENCOUNTER — Other Ambulatory Visit: Payer: Self-pay | Admitting: Family Medicine

## 2017-02-25 MED ORDER — ALPRAZOLAM 1 MG PO TABS: 1.0000 mg | ORAL_TABLET | Freq: Three times a day (TID) | ORAL | 0 refills | Status: DC | PRN

## 2017-02-25 NOTE — Telephone Encounter (Signed)
Seems to be doing better off her gabapentin, less stumbling and more with it. She is having a lot more trouble with her feet. Will decrease her to 100mg  qHS and check in in 1 week to see how she's doing.

## 2017-02-26 ENCOUNTER — Other Ambulatory Visit: Payer: Self-pay | Admitting: Family Medicine

## 2017-03-05 ENCOUNTER — Telehealth: Payer: Self-pay | Admitting: Family Medicine

## 2017-03-05 MED ORDER — LISINOPRIL 5 MG PO TABS: 5.0000 mg | ORAL_TABLET | Freq: Every day | ORAL | 1 refills | Status: DC

## 2017-03-05 MED ORDER — GABAPENTIN 100 MG PO CAPS: 100.0000 mg | ORAL_CAPSULE | Freq: Every day | ORAL | 1 refills | Status: DC

## 2017-03-05 MED ORDER — NAPROXEN 500 MG PO TABS: 500.0000 mg | ORAL_TABLET | Freq: Two times a day (BID) | ORAL | 1 refills | Status: DC

## 2017-03-05 MED ORDER — VENLAFAXINE HCL ER 75 MG PO CP24: 75.0000 mg | ORAL_CAPSULE | Freq: Every day | ORAL | 3 refills | Status: DC

## 2017-03-05 NOTE — Telephone Encounter (Signed)
-----   Message from Valerie Roys, DO sent at 02/25/2017 10:20 AM EST ----- Call about how she's doing on her gabapentin.

## 2017-03-05 NOTE — Telephone Encounter (Signed)
Called and spoke to [place] and her daughter. Doing much better on the 100mg  of gabapentin. Feet feeling good. No falling. No slurring. Will continue her on 100mg  qHS only.

## 2017-03-09 ENCOUNTER — Other Ambulatory Visit: Payer: Self-pay | Admitting: Family Medicine

## 2017-03-21 ENCOUNTER — Telehealth: Payer: Self-pay | Admitting: Family Medicine

## 2017-03-21 NOTE — Telephone Encounter (Signed)
Jana Half with [facility] called stating the patient needs to haveher glucometer and supplies ordered stating this is covered  If any questions she can be reached at [phone] opt [phone] ext [phone]  Thanks

## 2017-03-22 NOTE — Telephone Encounter (Signed)
Called and left Jana Half a VM asking which brand the patient needs to have sent in. I asked for her to return my call when she could.

## 2017-03-25 NOTE — Telephone Encounter (Signed)
Called and left Jana Half a VM asking for her to please return my call.

## 2017-03-25 NOTE — Telephone Encounter (Signed)
Called and spoke with patient's daughter. She stated that the patient does not have a glucometer and needs one sent in. I told her that I would try to call Jana Half with [facility] back and see which brand we need to write for.

## 2017-03-26 NOTE — Telephone Encounter (Signed)
Called and spoke with Jana Half. She states that the brand covered by the patients insurance is the [place]. Will fill out form for the glucometer and supplies, have provider sign, and fax to [facility].

## 2017-03-26 NOTE — Telephone Encounter (Signed)
RX faxed to [facility]

## 2017-04-02 ENCOUNTER — Other Ambulatory Visit: Payer: Self-pay | Admitting: Family Medicine

## 2017-04-02 MED ORDER — ALPRAZOLAM 1 MG PO TABS: 1.0000 mg | ORAL_TABLET | Freq: Three times a day (TID) | ORAL | 0 refills | Status: DC | PRN

## 2017-04-08 ENCOUNTER — Other Ambulatory Visit: Payer: Self-pay | Admitting: Family Medicine

## 2017-04-18 ENCOUNTER — Encounter: Payer: Self-pay | Admitting: Family Medicine

## 2017-04-18 ENCOUNTER — Ambulatory Visit (INDEPENDENT_AMBULATORY_CARE_PROVIDER_SITE_OTHER): Payer: Medicare HMO | Admitting: Family Medicine

## 2017-04-18 ENCOUNTER — Ambulatory Visit: Payer: Medicare HMO | Admitting: Family Medicine

## 2017-04-18 VITALS — BP 123/79 | HR 86 | Temp 98.5°F | Ht 66.34 in | Wt 230.4 lb

## 2017-04-18 DIAGNOSIS — D696 Thrombocytopenia, unspecified: Secondary | ICD-10-CM

## 2017-04-18 DIAGNOSIS — E1342 Other specified diabetes mellitus with diabetic polyneuropathy: Secondary | ICD-10-CM

## 2017-04-18 DIAGNOSIS — W19XXXA Unspecified fall, initial encounter: Secondary | ICD-10-CM

## 2017-04-18 DIAGNOSIS — E1129 Type 2 diabetes mellitus with other diabetic kidney complication: Secondary | ICD-10-CM | POA: Diagnosis not present

## 2017-04-18 DIAGNOSIS — R7402 Elevation of levels of lactic acid dehydrogenase (LDH): Secondary | ICD-10-CM

## 2017-04-18 DIAGNOSIS — E782 Mixed hyperlipidemia: Secondary | ICD-10-CM | POA: Diagnosis not present

## 2017-04-18 DIAGNOSIS — Z0000 Encounter for general adult medical examination without abnormal findings: Secondary | ICD-10-CM | POA: Diagnosis not present

## 2017-04-18 DIAGNOSIS — R740 Nonspecific elevation of levels of transaminase and lactic acid dehydrogenase [LDH]: Secondary | ICD-10-CM | POA: Diagnosis not present

## 2017-04-18 DIAGNOSIS — R404 Transient alteration of awareness: Secondary | ICD-10-CM

## 2017-04-18 DIAGNOSIS — K219 Gastro-esophageal reflux disease without esophagitis: Secondary | ICD-10-CM | POA: Diagnosis not present

## 2017-04-18 DIAGNOSIS — M159 Polyosteoarthritis, unspecified: Secondary | ICD-10-CM

## 2017-04-18 DIAGNOSIS — J45909 Unspecified asthma, uncomplicated: Secondary | ICD-10-CM | POA: Diagnosis not present

## 2017-04-18 DIAGNOSIS — Z23 Encounter for immunization: Secondary | ICD-10-CM

## 2017-04-18 DIAGNOSIS — N182 Chronic kidney disease, stage 2 (mild): Secondary | ICD-10-CM

## 2017-04-18 DIAGNOSIS — E1122 Type 2 diabetes mellitus with diabetic chronic kidney disease: Secondary | ICD-10-CM | POA: Diagnosis not present

## 2017-04-18 DIAGNOSIS — I10 Essential (primary) hypertension: Secondary | ICD-10-CM | POA: Diagnosis not present

## 2017-04-18 DIAGNOSIS — Z7189 Other specified counseling: Secondary | ICD-10-CM

## 2017-04-18 DIAGNOSIS — N390 Urinary tract infection, site not specified: Secondary | ICD-10-CM | POA: Diagnosis not present

## 2017-04-18 DIAGNOSIS — Z1231 Encounter for screening mammogram for malignant neoplasm of breast: Secondary | ICD-10-CM | POA: Diagnosis not present

## 2017-04-18 DIAGNOSIS — Z1239 Encounter for other screening for malignant neoplasm of breast: Secondary | ICD-10-CM

## 2017-04-18 DIAGNOSIS — C44722 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, including hip: Secondary | ICD-10-CM

## 2017-04-18 DIAGNOSIS — J301 Allergic rhinitis due to pollen: Secondary | ICD-10-CM

## 2017-04-18 DIAGNOSIS — F332 Major depressive disorder, recurrent severe without psychotic features: Secondary | ICD-10-CM | POA: Diagnosis not present

## 2017-04-18 DIAGNOSIS — F411 Generalized anxiety disorder: Secondary | ICD-10-CM

## 2017-04-18 DIAGNOSIS — M8580 Other specified disorders of bone density and structure, unspecified site: Secondary | ICD-10-CM

## 2017-04-18 LAB — HEMOGLOBIN A1C: HEMOGLOBIN A1C: 6.5

## 2017-04-18 MED ORDER — OMEPRAZOLE 20 MG PO CPDR: 20.0000 mg | DELAYED_RELEASE_CAPSULE | Freq: Every day | ORAL | 1 refills | Status: DC

## 2017-04-18 MED ORDER — METFORMIN HCL 1000 MG PO TABS: ORAL_TABLET | ORAL | 1 refills | Status: DC

## 2017-04-18 MED ORDER — GABAPENTIN 100 MG PO CAPS: 100.0000 mg | ORAL_CAPSULE | Freq: Every day | ORAL | 1 refills | Status: DC

## 2017-04-18 MED ORDER — VENLAFAXINE HCL ER 150 MG PO CP24: ORAL_CAPSULE | ORAL | 1 refills | Status: DC

## 2017-04-18 NOTE — Progress Notes (Signed)
BP 123/79 (BP Location: Left Arm, Patient Position: Sitting, Cuff Size: Normal)   Pulse 86   Temp 98.5 F (36.9 C)   Ht 5' 6.34" (1.685 m)   Wt 230 lb 6.4 oz (104.5 kg)   SpO2 92%   BMI 36.81 kg/m    Subjective:    Patient ID: Dawn Moore, female    DOB: Nov 30, 1941, 76 y.o.   MRN: [id number]  HPI: Dawn Moore is a 76 y.o. female presenting on 04/18/2017 for comprehensive medical examination. Current medical complaints include:  Fall- had on some shoes and the tip caught on the doorstep and she fell. She fell on her R knee and her R hand, has been sore. She does not think that she broke anything. She just feels sore now.   DIABETES Hypoglycemic episodes:no Polydipsia/polyuria: no Visual disturbance: no Chest pain: no Paresthesias: no Glucose Monitoring: no Taking Insulin?: no Blood Pressure Monitoring: rarely Retinal Examination: Up to Date Foot Exam: Up to Date Diabetic Education: Completed Pneumovax: Given today. Influenza: Up to Date Aspirin: yes  HYPERTENSION / HYPERLIPIDEMIA Satisfied with current treatment? yes Duration of hypertension: chronic BP monitoring frequency: rarely BP medication side effects: no Past BP meds: lisinopril Duration of hyperlipidemia: chronic Cholesterol medication side effects: no Cholesterol supplements: none Past cholesterol medications: simvastatin Medication compliance: excellent compliance Aspirin: yes Recent stressors: yes Recurrent headaches: no Visual changes: no Palpitations: no Dyspnea: no Chest pain: no Lower extremity edema: no Dizzy/lightheaded: no  DEPRESSION/ANXIETY- not under good control.  Mood status: stable Satisfied with current treatment?: yes Symptom severity: moderate  Duration of current treatment : chronic Side effects: no Medication compliance: excellent compliance Psychotherapy/counseling: no  Previous psychiatric medications: effexor Depressed mood: yes Anxious mood: yes Anhedonia:  no Significant weight loss or gain: no Insomnia: no  Fatigue: yes Feelings of worthlessness or guilt: no Impaired concentration/indecisiveness: no Suicidal ideations: no Hopelessness: no Crying spells: yes Depression screen [facility] 2/9 04/18/2017 07/24/2016 07/02/2016 06/15/2016 03/23/2016  Decreased Interest 3 1 2 3 3   Down, Depressed, Hopeless 3 1 2 3 3   PHQ - 2 Score 6 2 4 6 6   Altered sleeping 3 - 3 - 3  Tired, decreased energy 3 - 2 - 3  Change in appetite 3 - 0 - 3  Feeling bad or failure about yourself  0 - 2 - 3  Trouble concentrating 3 - 2 - 3  Moving slowly or fidgety/restless 0 - 0 - 0  Suicidal thoughts 0 - 0 - 0  PHQ-9 Score 18 - 13 - 21  Difficult doing work/chores - - Very difficult - Very difficult   She currently lives with: daughter Menopausal Symptoms: no  Functional Status Survey: Is the patient deaf or have difficulty hearing?: Yes (some trouble hearing) Does the patient have difficulty seeing, even when wearing glasses/contacts?: No Does the patient have difficulty concentrating, remembering, or making decisions?: Yes Does the patient have difficulty walking or climbing stairs?: No Does the patient have difficulty dressing or bathing?: No Does the patient have difficulty doing errands alone such as visiting a doctor's office or shopping?: No  Fall Risk  04/18/2017 08/06/2016 06/15/2016  Falls in the past year? Yes Yes Yes  Number falls in past yr: 2 or more 2 or more 2 or more  Injury with Fall? Yes Yes -  Follow up - Falls evaluation completed -    Depression Screen Depression screen [facility] 2/9 04/18/2017 07/24/2016 07/02/2016 06/15/2016 03/23/2016  Decreased Interest 3 1 2  3  3  Down, Depressed, Hopeless 3 1 2 3 3   PHQ - 2 Score 6 2 4 6 6   Altered sleeping 3 - 3 - 3  Tired, decreased energy 3 - 2 - 3  Change in appetite 3 - 0 - 3  Feeling bad or failure about yourself  0 - 2 - 3  Trouble concentrating 3 - 2 - 3  Moving slowly or fidgety/restless 0 - 0 - 0  Suicidal  thoughts 0 - 0 - 0  PHQ-9 Score 18 - 13 - 21  Difficult doing work/chores - - Very difficult - Very difficult    Advanced Directives Does patient have a HCPOA?    no Does patient have a living will or MOST form?  no  Past Medical History:  Past Medical History:  Diagnosis Date  . Anxiety   . Arthritis    OA knees, hands and feet  . Depression   . Diabetes mellitus without complication ([place])   . GERD (gastroesophageal reflux disease)   . Headache   . Osteopenia   . Shortness of breath dyspnea   . Sleep apnea    doesn't use CPAP  . 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 ABDOMINAL HYSTERECTOMY  1986   still has one ovary  . CATARACT EXTRACTION W/PHACO Left 03/07/2016   Procedure: CATARACT EXTRACTION PHACO AND INTRAOCULAR LENS PLACEMENT (IOC);  Surgeon: Leandrew Koyanagi, MD;  Location: [facility];  Service: Ophthalmology;  Laterality: Left;  DIABETIC -oral meds Sleep apnea  . CATARACT EXTRACTION W/PHACO Right 03/28/2016   Procedure: CATARACT EXTRACTION PHACO AND INTRAOCULAR LENS PLACEMENT (IOC);  Surgeon: Leandrew Koyanagi, MD;  Location: [place] [facility];  Service: Ophthalmology;  Laterality: Right;  DIABETIC - oral meds  . CHOLECYSTECTOMY  1999  . COCHLEAR IMPLANT    . FOOT FRACTURE SURGERY    . HEEL SPUR SURGERY Left 11/04/09   Revision 01/2011  . INTRAOCULAR LENS INSERTION Bilateral     Medications:  Current Outpatient Prescriptions on File Prior to Visit  Medication Sig  . acetaminophen (TYLENOL) 500 MG tablet Take 500 mg by mouth 2 (two) times daily.  Marland Kitchen ALPRAZolam (XANAX) 1 MG tablet Take 1 tablet (1 mg total) by mouth 3 (three) times daily as needed for anxiety.  . Ascorbic Acid (VITAMIN C) 100 MG tablet Take 100 mg by mouth daily. Reported on 02/28/2016  . aspirin EC 81 MG tablet Take 81 mg by mouth. Reported on 02/28/2016  . Cholecalciferol (D 1000) 1000 UNITS capsule  Take by mouth.  Marland Kitchen lisinopril (PRINIVIL,ZESTRIL) 5 MG tablet Take 1 tablet (5 mg total) by mouth daily.  . Multiple Vitamins-Minerals (HAIR SKIN AND NAILS FORMULA) TABS Take by mouth once.  . naproxen (NAPROSYN) 500 MG tablet Take 1 tablet (500 mg total) by mouth 2 (two) times daily with a meal.  . QUEtiapine (SEROQUEL) 25 MG tablet TAKE 1 TABLET AT BEDTIME  . simvastatin (ZOCOR) 10 MG tablet TAKE 1 TABLET EVERY EVENING  . venlafaxine XR (EFFEXOR-XR) 75 MG 24 hr capsule TAKE 1 CAPSULE DAILY WITH BREAKFAST (TOTAL DAILY DOSE IS 225MG )   No current facility-administered medications on file prior to visit.     Allergies:  Allergies  Allergen Reactions  . Nsaids Other (See Comments)    Per Hematology  . Wellbutrin [Bupropion] Other (See Comments)    Thoughts that she would die    Social History:  Social History   Social History  . Marital status: Widowed    Spouse name: N/A  . Number of children: N/A  . Years of education: N/A   Occupational History  . Not on file.   Social History Main Topics  . Smoking status: Never Smoker  . Smokeless tobacco: Never Used  . Alcohol use No  . Drug use: No  . Sexual activity: No   Other Topics Concern  . Not on file   Social History Narrative  . No narrative on file   History  Smoking Status  . Never Smoker  Smokeless Tobacco  . Never Used   History  Alcohol Use No    Family History:  Family History  Problem Relation Age of Onset  . Stroke Mother   . Dementia Mother   . Cancer Father     prostate  . Hyperlipidemia Son   . Hypertension Son   . Mental illness Son   . Alcohol abuse Son   . Arthritis Sister     rheumatoid  . Cancer Sister     breast  . Thyroid disease Sister   . Cancer Sister     thyroid  . Cancer Brother     prostate  . Arthritis Maternal Grandmother     Past medical history, surgical history, medications, allergies, family history and social history reviewed with patient today and changes made to  appropriate areas of the chart.   Review of Systems  Constitutional: Negative.   HENT: Positive for hearing loss. Negative for congestion, ear discharge, ear pain, nosebleeds, sinus pain, sore throat and tinnitus.   Eyes: Negative.   Respiratory: Positive for cough and wheezing. Negative for hemoptysis, sputum production, shortness of breath and stridor.   Cardiovascular: Negative.   Gastrointestinal: Positive for heartburn (about a week ago, better back on the omeprazole). Negative for abdominal pain, blood in stool, constipation, diarrhea, melena, nausea and vomiting.  Genitourinary: Negative.   Musculoskeletal: Positive for joint pain. Negative for back pain, falls, myalgias and neck pain.  Skin: Positive for rash. Negative for itching.  Neurological: Negative.   Endo/Heme/Allergies: Positive for environmental allergies. Negative for polydipsia. Bruises/bleeds easily.  Psychiatric/Behavioral: Positive for depression. Negative for hallucinations, memory loss, substance abuse and suicidal ideas. The patient is nervous/anxious. The patient does not have insomnia.     All other ROS negative except what is listed above and in the HPI.      Objective:    BP 123/79 (BP Location: Left Arm, Patient Position: Sitting, Cuff Size: Normal)   Pulse 86   Temp 98.5 F (36.9 C)   Ht 5' 6.34" (1.685 m)   Wt 230 lb 6.4 oz (104.5 kg)   SpO2 92%   BMI 36.81 kg/m   Wt Readings from Last 3 Encounters:  04/18/17 230 lb 6.4 oz (104.5 kg)  01/25/17 233 lb 8 oz (105.9 kg)  12/14/16 228 lb 6.4 oz (103.6 kg)     Hearing Screening   125Hz  250Hz  500Hz  1000Hz  2000Hz  3000Hz  4000Hz  6000Hz  8000Hz   Right ear:   20 20 20   Fail    Left ear:   40 Fail 40  40      Visual Acuity Screening   Right eye Left eye Both eyes  Without correction: 20/25 20/25 20/25   With correction:       Physical Exam  Constitutional: She is oriented to person, place, and time. She appears well-developed and well-nourished. No  distress.  HENT:  Head: Normocephalic and  atraumatic.  Right Ear: Hearing, tympanic membrane, external ear and ear canal normal.  Left Ear: Hearing, tympanic membrane, external ear and ear canal normal.  Nose: Nose normal.  Mouth/Throat: Uvula is midline, oropharynx is clear and moist and mucous membranes are normal. No oropharyngeal exudate.  Eyes: Conjunctivae, EOM and lids are normal. Pupils are equal, round, and reactive to light. Right eye exhibits no discharge. Left eye exhibits no discharge. No scleral icterus.  Neck: Normal range of motion. Neck supple. No JVD present. No tracheal deviation present. No thyromegaly present.  Cardiovascular: Normal rate, regular rhythm, normal heart sounds and intact distal pulses.  Exam reveals no gallop and no friction rub.   No murmur heard. Pulmonary/Chest: Effort normal and breath sounds normal. No stridor. No respiratory distress. She has no wheezes. She has no rales. She exhibits no tenderness.  Abdominal: Soft. Bowel sounds are normal. She exhibits no distension and no mass. There is no tenderness. There is no rebound and no guarding.  Genitourinary:  Genitourinary Comments: Breast and pelvic exams deferred with shared decision making.   Musculoskeletal: Normal range of motion. She exhibits no edema, tenderness or deformity.  Lymphadenopathy:    She has no cervical adenopathy.  Neurological: She is alert and oriented to person, place, and time. She has normal reflexes. She displays normal reflexes. No cranial nerve deficit. She exhibits normal muscle tone. Coordination normal.  Skin: Skin is warm, dry and intact. No rash noted. She is not diaphoretic. No erythema. No pallor.  Psychiatric: She has a normal mood and affect. Her speech is normal and behavior is normal. Judgment and thought content normal. Cognition and memory are normal.  Nursing note and vitals reviewed.   6CIT Screen 04/18/2017  What Year? 0 points  What month? 0 points  What  time? 0 points  Count back from 20 0 points  Months in reverse 0 points  Repeat phrase 0 points  Total Score 0     Results for orders placed or performed in visit on 04/18/17  Hemoglobin A1c  Result Value Ref Range   Hemoglobin A1C 6.5       Assessment & Plan:   Problem List Items Addressed This Visit      Cardiovascular and Mediastinum   HTN (hypertension)    Under good control. Continue current regimen. Continue to monitor. Call with any concerns.       Relevant Orders   CBC with Differential/Platelet   Comprehensive metabolic panel   Microalbumin, Urine Waived   TSH   UA/M w/rflx Culture, Routine     Respiratory   Asthma    Under good control. Continue current regimen. Continue to monitor. Call with any concerns.       Relevant Orders   CBC with Differential/Platelet   Comprehensive metabolic panel   TSH   UA/M w/rflx Culture, Routine     Digestive   GERD (gastroesophageal reflux disease)    Under good control. Continue current regimen. Continue to monitor. Call with any concerns.       Relevant Medications   omeprazole (PRILOSEC) 20 MG capsule   Other Relevant Orders   CBC with Differential/Platelet   Comprehensive metabolic panel   TSH   UA/M w/rflx Culture, Routine     Endocrine   Diabetes mellitus with renal manifestations, controlled ([place])    Under fair control with A1c today of 6.5. Continue current regimen. Continue to monitor. Call with any concerns.       Relevant Medications  metFORMIN (GLUCOPHAGE) 1000 MG tablet   Other Relevant Orders   Bayer DCA Hb A1c Waived   CBC with Differential/Platelet   Comprehensive metabolic panel   TSH   UA/M w/rflx Culture, Routine   Diabetic neuropathy (HCC)    Stable on current regimen. Continue current regimen. Continue to monitor. Call with any concerns.       Relevant Medications   metFORMIN (GLUCOPHAGE) 1000 MG tablet     Musculoskeletal and Integument   Osteopenia    Continue weight bearing  exercise and calcium and vitamin D.      Generalized osteoarthritis    Under fair control. Continue current regimen. Continue to monitor. Call with any concerns.       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 Call with any concerns.         Other   Anxiety disorder    Stable. Continue current regimen. Continue to monitor. Call with any concerns. Refills given.       Relevant Orders   CBC with Differential/Platelet   Comprehensive metabolic panel   TSH   UA/M w/rflx Culture, Routine   Thrombocytopenia (HCC)    Rechecking levels again today. Await results.       Relevant Orders   CBC with Differential/Platelet   Comprehensive metabolic panel   TSH   UA/M w/rflx Culture, Routine   Depression    Stable. Continue current regimen. Continue to monitor. Call with any concerns. Refills given.       Relevant Medications   venlafaxine XR (EFFEXOR-XR) 150 MG 24 hr capsule   Other Relevant Orders   CBC with Differential/Platelet   Comprehensive metabolic panel   TSH   UA/M w/rflx Culture, Routine   Hyperlipidemia    Under good control. Continue current regimen. Continue to monitor. Call with any concerns. Refills given.       Relevant Orders   CBC with Differential/Platelet   Comprehensive metabolic panel   Lipid Panel w/o Chol/HDL Ratio   TSH   UA/M w/rflx Culture, Routine   Nonspecific elevation of levels of transaminase or lactic acid dehydrogenase (LDH)    Rechecking levels again today. Await results.       Relevant Orders   CBC with Differential/Platelet   Comprehensive metabolic panel   TSH   UA/M w/rflx Culture, Routine   Transient alteration of awareness    Has not been an issue since changing medicine. Continue to monitor.       Advanced care planning/counseling discussion    Has not thought about it- information given.        Other Visit Diagnoses    Medicare annual wellness visit,  subsequent    -  Primary   Preventative care discussed as below.    Fall, initial encounter       Use cane/walker. Continue to monitor. No need for x-rays today.   Screening for breast cancer       Mammogram ordered previously   Immunization due       Pneumovax #2 given today. Rx for shingrix given today.   Relevant Orders   Pneumococcal polysaccharide vaccine 23-valent greater than or equal to 2yo subcutaneous/IM (Completed)   Seasonal allergic rhinitis due to pollen       Rinse nose out after bing outside. Call if not getting better or getting worse.        Preventative Services:  Health Risk Assessment and Personalized Prevention Plan:  Done today Bone Mass Measurements: Up to date Breast Cancer Screening: Ordered today CVD Screening: Done today Cervical Cancer Screening: N/A Colon Cancer Screening: Up to date Depression Screening: Done today Diabetes Screening: Done today Glaucoma Screening: See your eye doctor Hepatitis B vaccine: N/A Hepatitis C screening:  N/A HIV Screening: N/A Flu Vaccine: Up to date Lung cancer Screening: N/A Obesity Screening: Done today Pneumonia Vaccines (2): 2nd pneumovax given today STI Screening: N/A  Follow up plan: Return in about 3 months (around 07/18/2017) for DM visit.   LABORATORY TESTING:  - Pap smear: not applicable  IMMUNIZATIONS:   - Tdap: Tetanus vaccination status reviewed: last tetanus booster within 10 years. - Influenza: Up to date - Pneumovax: Up dated today - Prevnar: Up to date - Zostavax vaccine: Up to date  SCREENING: -Mammogram: Ordered today  - Colonoscopy: Up to date  - Bone Density: Up to date  -Hearing Test: Ordered today   PATIENT COUNSELING:   Advised to take 1 mg of folate supplement per day if capable of pregnancy.   Sexuality: Discussed sexually transmitted diseases, partner selection, use of condoms, avoidance of unintended pregnancy  and contraceptive alternatives.   Advised to avoid cigarette  smoking.  I discussed with the patient that most people either abstain from alcohol or drink within safe limits (<=14/week and <=4 drinks/occasion for males, <=7/weeks and <= 3 drinks/occasion for females) and that the risk for alcohol disorders and other health effects rises proportionally with the number of drinks per week and how often a drinker exceeds daily limits.  Discussed cessation/primary prevention of drug use and availability of treatment for abuse.   Diet: Encouraged to adjust caloric intake to maintain  or achieve ideal body weight, to reduce intake of dietary saturated fat and total fat, to limit sodium intake by avoiding high sodium foods and not adding table salt, and to maintain adequate dietary potassium and calcium preferably from fresh fruits, vegetables, and low-fat dairy products.    stressed the importance of regular exercise  Injury prevention: Discussed safety belts, safety helmets, smoke detector, smoking near bedding or upholstery.   Dental health: Discussed importance of regular tooth brushing, flossing, and dental visits.    NEXT PREVENTATIVE PHYSICAL DUE IN 1 YEAR. Return in about 3 months (around 07/18/2017) for DM visit.

## 2017-04-18 NOTE — Assessment & Plan Note (Signed)
Under good control. Continue current regimen. Continue to monitor. Call with any concerns. 

## 2017-04-18 NOTE — Assessment & Plan Note (Signed)
Rechecking levels again today. Await results. 

## 2017-04-18 NOTE — Assessment & Plan Note (Signed)
Continue weight bearing exercise and calcium and vitamin D.

## 2017-04-18 NOTE — Assessment & Plan Note (Addendum)
Under fair control with A1c today of 6.5. Continue current regimen. Continue to monitor. Call with any concerns.

## 2017-04-18 NOTE — Patient Instructions (Addendum)
Preventative Services:  Health Risk Assessment and Personalized Prevention Plan: Done today Bone Mass Measurements: Up to date Breast Cancer Screening: Ordered today CVD Screening: Done today Cervical Cancer Screening: N/A Colon Cancer Screening: Up to date Depression Screening: Done today Diabetes Screening: Done today Glaucoma Screening: See your eye doctor Hepatitis B vaccine: N/A Hepatitis C screening:  N/A HIV Screening: N/A Flu Vaccine: Up to date Lung cancer Screening: N/A Obesity Screening: Done today Pneumonia Vaccines (2): 2nd pneumovax given today STI Screening: N/AHealth Maintenance, Female Adopting a healthy lifestyle and getting preventive care can go a long way to promote health and wellness. Talk with your health care provider about what schedule of regular examinations is right for you. This is a good chance for you to check in with your provider about disease prevention and staying healthy. In between checkups, there are plenty of things you can do on your own. Experts have done a lot of research about which lifestyle changes and preventive measures are most likely to keep you healthy. Ask your health care provider for more information. Weight and diet Eat a healthy diet  Be sure to include plenty of vegetables, fruits, low-fat dairy products, and lean protein.  Do not eat a lot of foods high in solid fats, added sugars, or salt.  Get regular exercise. This is one of the most important things you can do for your health.  Most adults should exercise for at least 150 minutes each week. The exercise should increase your heart rate and make you sweat (moderate-intensity exercise).  Most adults should also do strengthening exercises at least twice a week. This is in addition to the moderate-intensity exercise. Maintain a healthy weight  Body mass index (BMI) is a measurement that can be used to identify possible weight problems. It estimates body fat based on height and  weight. Your health care provider can help determine your BMI and help you achieve or maintain a healthy weight.  For females 93 years of age and older:  A BMI below 18.5 is considered underweight.  A BMI of 18.5 to 24.9 is normal.  A BMI of 25 to 29.9 is considered overweight.  A BMI of 30 and above is considered obese. Watch levels of cholesterol and blood lipids  You should start having your blood tested for lipids and cholesterol at 75 years of age, then have this test every 5 years.  You may need to have your cholesterol levels checked more often if:  Your lipid or cholesterol levels are high.  You are older than 75 years of age.  You are at high risk for heart disease. Cancer screening Lung Cancer  Lung cancer screening is recommended for adults 82-63 years old who are at high risk for lung cancer because of a history of smoking.  A yearly low-dose CT scan of the lungs is recommended for people who:  Currently smoke.  Have quit within the past 15 years.  Have at least a 30-pack-year history of smoking. A pack year is smoking an average of one pack of cigarettes a day for 1 year.  Yearly screening should continue until it has been 15 years since you quit.  Yearly screening should stop if you develop a health problem that would prevent you from having lung cancer treatment. Breast Cancer  Practice breast self-awareness. This means understanding how your breasts normally appear and feel.  It also means doing regular breast self-exams. Let your health care provider know about any changes, no matter  how small.  If you are in your 20s or 30s, you should have a clinical breast exam (CBE) by a health care provider every 1-3 years as part of a regular health exam.  If you are 51 or older, have a CBE every year. Also consider having a breast X-ray (mammogram) every year.  If you have a family history of breast cancer, talk to your health care provider about genetic  screening.  If you are at high risk for breast cancer, talk to your health care provider about having an MRI and a mammogram every year.  Breast cancer gene (BRCA) assessment is recommended for women who have family members with BRCA-related cancers. BRCA-related cancers include:  Breast.  Ovarian.  Tubal.  Peritoneal cancers.  Results of the assessment will determine the need for genetic counseling and BRCA1 and BRCA2 testing. Cervical Cancer  Your health care provider may recommend that you be screened regularly for cancer of the pelvic organs (ovaries, uterus, and vagina). This screening involves a pelvic examination, including checking for microscopic changes to the surface of your cervix (Pap test). You may be encouraged to have this screening done every 3 years, beginning at age 38.  For women ages 46-65, health care providers may recommend pelvic exams and Pap testing every 3 years, or they may recommend the Pap and pelvic exam, combined with testing for human papilloma virus (HPV), every 5 years. Some types of HPV increase your risk of cervical cancer. Testing for HPV may also be done on women of any age with unclear Pap test results.  Other health care providers may not recommend any screening for nonpregnant women who are considered low risk for pelvic cancer and who do not have symptoms. Ask your health care provider if a screening pelvic exam is right for you.  If you have had past treatment for cervical cancer or a condition that could lead to cancer, you need Pap tests and screening for cancer for at least 20 years after your treatment. If Pap tests have been discontinued, your risk factors (such as having a new sexual partner) need to be reassessed to determine if screening should resume. Some women have medical problems that increase the chance of getting cervical cancer. In these cases, your health care provider may recommend more frequent screening and Pap tests. Colorectal  Cancer  This type of cancer can be detected and often prevented.  Routine colorectal cancer screening usually begins at 75 years of age and continues through 75 years of age.  Your health care provider may recommend screening at an earlier age if you have risk factors for colon cancer.  Your health care provider may also recommend using home test kits to check for hidden blood in the stool.  A small camera at the end of a tube can be used to examine your colon directly (sigmoidoscopy or colonoscopy). This is done to check for the earliest forms of colorectal cancer.  Routine screening usually begins at age 35.  Direct examination of the colon should be repeated every 5-10 years through 75 years of age. However, you may need to be screened more often if early forms of precancerous polyps or small growths are found. Skin Cancer  Check your skin from head to toe regularly.  Tell your health care provider about any new moles or changes in moles, especially if there is a change in a mole's shape or color.  Also tell your health care provider if you have a mole that  is larger than the size of a pencil eraser.  Always use sunscreen. Apply sunscreen liberally and repeatedly throughout the day.  Protect yourself by wearing long sleeves, pants, a wide-brimmed hat, and sunglasses whenever you are outside. Heart disease, diabetes, and high blood pressure  High blood pressure causes heart disease and increases the risk of stroke. High blood pressure is more likely to develop in:  People who have blood pressure in the high end of the normal range (130-139/85-89 mm Hg).  People who are overweight or obese.  People who are African American.  If you are 49-76 years of age, have your blood pressure checked every 3-5 years. If you are 44 years of age or older, have your blood pressure checked every year. You should have your blood pressure measured twice-once when you are at a hospital or clinic,  and once when you are not at a hospital or clinic. Record the average of the two measurements. To check your blood pressure when you are not at a hospital or clinic, you can use:  An automated blood pressure machine at a pharmacy.  A home blood pressure monitor.  If you are between 83 years and 68 years old, ask your health care provider if you should take aspirin to prevent strokes.  Have regular diabetes screenings. This involves taking a blood sample to check your fasting blood sugar level.  If you are at a normal weight and have a low risk for diabetes, have this test once every three years after 75 years of age.  If you are overweight and have a high risk for diabetes, consider being tested at a younger age or more often. Preventing infection Hepatitis B  If you have a higher risk for hepatitis B, you should be screened for this virus. You are considered at high risk for hepatitis B if:  You were born in a country where hepatitis B is common. Ask your health care provider which countries are considered high risk.  Your parents were born in a high-risk country, and you have not been immunized against hepatitis B (hepatitis B vaccine).  You have HIV or AIDS.  You use needles to inject street drugs.  You live with someone who has hepatitis B.  You have had sex with someone who has hepatitis B.  You get hemodialysis treatment.  You take certain medicines for conditions, including cancer, organ transplantation, and autoimmune conditions. Hepatitis C  Blood testing is recommended for:  Everyone born from [birth year] through 1964.  Anyone with known risk factors for hepatitis C. Sexually transmitted infections (STIs)  You should be screened for sexually transmitted infections (STIs) including gonorrhea and chlamydia if:  You are sexually active and are younger than 75 years of age.  You are older than 75 years of age and your health care provider tells you that you are at risk  for this type of infection.  Your sexual activity has changed since you were last screened and you are at an increased risk for chlamydia or gonorrhea. Ask your health care provider if you are at risk.  If you do not have HIV, but are at risk, it may be recommended that you take a prescription medicine daily to prevent HIV infection. This is called pre-exposure prophylaxis (PrEP). You are considered at risk if:  You are sexually active and do not regularly use condoms or know the HIV status of your partner(s).  You take drugs by injection.  You are sexually active with a  partner who has HIV. Talk with your health care provider about whether you are at high risk of being infected with HIV. If you choose to begin PrEP, you should first be tested for HIV. You should then be tested every 3 months for as long as you are taking PrEP. Pregnancy  If you are premenopausal and you may become pregnant, ask your health care provider about preconception counseling.  If you may become pregnant, take 400 to 800 micrograms (mcg) of folic acid every day.  If you want to prevent pregnancy, talk to your health care provider about birth control (contraception). Osteoporosis and menopause  Osteoporosis is a disease in which the bones lose minerals and strength with aging. This can result in serious bone fractures. Your risk for osteoporosis can be identified using a bone density scan.  If you are 40 years of age or older, or if you are at risk for osteoporosis and fractures, ask your health care provider if you should be screened.  Ask your health care provider whether you should take a calcium or vitamin D supplement to lower your risk for osteoporosis.  Menopause may have certain physical symptoms and risks.  Hormone replacement therapy may reduce some of these symptoms and risks. Talk to your health care provider about whether hormone replacement therapy is right for you. Follow these instructions at  home:  Schedule regular health, dental, and eye exams.  Stay current with your immunizations.  Do not use any tobacco products including cigarettes, chewing tobacco, or electronic cigarettes.  If you are pregnant, do not drink alcohol.  If you are breastfeeding, limit how much and how often you drink alcohol.  Limit alcohol intake to no more than 1 drink per day for nonpregnant women. One drink equals 12 ounces of beer, 5 ounces of wine, or 1 ounces of hard liquor.  Do not use street drugs.  Do not share needles.  Ask your health care provider for help if you need support or information about quitting drugs.  Tell your health care provider if you often feel depressed.  Tell your health care provider if you have ever been abused or do not feel safe at home. This information is not intended to replace advice given to you by your health care provider. Make sure you discuss any questions you have with your health care provider. Document Released: 07/02/2011 Document Revised: 05/24/2016 Document Reviewed: 09/20/2015 Elsevier Interactive Patient Education  2017 [place] Maintenance for Postmenopausal Women Menopause is a normal process in which your reproductive ability comes to an end. This process happens gradually over a span of months to years, usually between the ages of 15 and 23. Menopause is complete when you have missed 12 consecutive menstrual periods. It is important to talk with your health care provider about some of the most common conditions that affect postmenopausal women, such as heart disease, cancer, and bone loss (osteoporosis). Adopting a healthy lifestyle and getting preventive care can help to promote your health and wellness. Those actions can also lower your chances of developing some of these common conditions. What should I know about menopause? During menopause, you may experience a number of symptoms, such as:  Moderate-to-severe hot  flashes.  Night sweats.  Decrease in sex drive.  Mood swings.  Headaches.  Tiredness.  Irritability.  Memory problems.  Insomnia. Choosing to treat or not to treat menopausal changes is an individual decision that you make with your health care provider. What should I  know about hormone replacement therapy and supplements? Hormone therapy products are effective for treating symptoms that are associated with menopause, such as hot flashes and night sweats. Hormone replacement carries certain risks, especially as you become older. If you are thinking about using estrogen or estrogen with progestin treatments, discuss the benefits and risks with your health care provider. What should I know about heart disease and stroke? Heart disease, heart attack, and stroke become more likely as you age. This may be due, in part, to the hormonal changes that your body experiences during menopause. These can affect how your body processes dietary fats, triglycerides, and cholesterol. Heart attack and stroke are both medical emergencies. There are many things that you can do to help prevent heart disease and stroke:  Have your blood pressure checked at least every 1-2 years. High blood pressure causes heart disease and increases the risk of stroke.  If you are 22-70 years old, ask your health care provider if you should take aspirin to prevent a heart attack or a stroke.  Do not use any tobacco products, including cigarettes, chewing tobacco, or electronic cigarettes. If you need help quitting, ask your health care provider.  It is important to eat a healthy diet and maintain a healthy weight.  Be sure to include plenty of vegetables, fruits, low-fat dairy products, and lean protein.  Avoid eating foods that are high in solid fats, added sugars, or salt (sodium).  Get regular exercise. This is one of the most important things that you can do for your health.  Try to exercise for at least 150  minutes each week. The type of exercise that you do should increase your heart rate and make you sweat. This is known as moderate-intensity exercise.  Try to do strengthening exercises at least twice each week. Do these in addition to the moderate-intensity exercise.  Know your numbers.Ask your health care provider to check your cholesterol and your blood glucose. Continue to have your blood tested as directed by your health care provider. What should I know about cancer screening? There are several types of cancer. Take the following steps to reduce your risk and to catch any cancer development as early as possible. Breast Cancer  Practice breast self-awareness.  This means understanding how your breasts normally appear and feel.  It also means doing regular breast self-exams. Let your health care provider know about any changes, no matter how small.  If you are 27 or older, have a clinician do a breast exam (clinical breast exam or CBE) every year. Depending on your age, family history, and medical history, it may be recommended that you also have a yearly breast X-ray (mammogram).  If you have a family history of breast cancer, talk with your health care provider about genetic screening.  If you are at high risk for breast cancer, talk with your health care provider about having an MRI and a mammogram every year.  Breast cancer (BRCA) gene test is recommended for women who have family members with BRCA-related cancers. Results of the assessment will determine the need for genetic counseling and BRCA1 and for BRCA2 testing. BRCA-related cancers include these types:  Breast. This occurs in males or females.  Ovarian.  Tubal. This may also be called fallopian tube cancer.  Cancer of the abdominal or pelvic lining (peritoneal cancer).  Prostate.  Pancreatic. Cervical, Uterine, and Ovarian Cancer  Your health care provider may recommend that you be screened regularly for cancer of  the pelvic  organs. These include your ovaries, uterus, and vagina. This screening involves a pelvic exam, which includes checking for microscopic changes to the surface of your cervix (Pap test).  For women ages 21-65, health care providers may recommend a pelvic exam and a Pap test every three years. For women ages 77-65, they may recommend the Pap test and pelvic exam, combined with testing for human papilloma virus (HPV), every five years. Some types of HPV increase your risk of cervical cancer. Testing for HPV may also be done on women of any age who have unclear Pap test results.  Other health care providers may not recommend any screening for nonpregnant women who are considered low risk for pelvic cancer and have no symptoms. Ask your health care provider if a screening pelvic exam is right for you.  If you have had past treatment for cervical cancer or a condition that could lead to cancer, you need Pap tests and screening for cancer for at least 20 years after your treatment. If Pap tests have been discontinued for you, your risk factors (such as having a new sexual partner) need to be reassessed to determine if you should start having screenings again. Some women have medical problems that increase the chance of getting cervical cancer. In these cases, your health care provider may recommend that you have screening and Pap tests more often.  If you have a family history of uterine cancer or ovarian cancer, talk with your health care provider about genetic screening.  If you have vaginal bleeding after reaching menopause, tell your health care provider.  There are currently no reliable tests available to screen for ovarian cancer. Lung Cancer  Lung cancer screening is recommended for adults 63-43 years old who are at high risk for lung cancer because of a history of smoking. A yearly low-dose CT scan of the lungs is recommended if you:  Currently smoke.  Have a history of at least 30  pack-years of smoking and you currently smoke or have quit within the past 15 years. A pack-year is smoking an average of one pack of cigarettes per day for one year. Yearly screening should:  Continue until it has been 15 years since you quit.  Stop if you develop a health problem that would prevent you from having lung cancer treatment. Colorectal Cancer  This type of cancer can be detected and can often be prevented.  Routine colorectal cancer screening usually begins at age 4 and continues through age 73.  If you have risk factors for colon cancer, your health care provider may recommend that you be screened at an earlier age.  If you have a family history of colorectal cancer, talk with your health care provider about genetic screening.  Your health care provider may also recommend using home test kits to check for hidden blood in your stool.  A small camera at the end of a tube can be used to examine your colon directly (sigmoidoscopy or colonoscopy). This is done to check for the earliest forms of colorectal cancer.  Direct examination of the colon should be repeated every 5-10 years until age 45. However, if early forms of precancerous polyps or small growths are found or if you have a family history or genetic risk for colorectal cancer, you may need to be screened more often. Skin Cancer  Check your skin from head to toe regularly.  Monitor any moles. Be sure to tell your health care provider:  About any new moles or changes  in moles, especially if there is a change in a mole's shape or color.  If you have a mole that is larger than the size of a pencil eraser.  If any of your family members has a history of skin cancer, especially at a young age, talk with your health care provider about genetic screening.  Always use sunscreen. Apply sunscreen liberally and repeatedly throughout the day.  Whenever you are outside, protect yourself by wearing long sleeves, pants, a  wide-brimmed hat, and sunglasses. What should I know about osteoporosis? Osteoporosis is a condition in which bone destruction happens more quickly than new bone creation. After menopause, you may be at an increased risk for osteoporosis. To help prevent osteoporosis or the bone fractures that can happen because of osteoporosis, the following is recommended:  If you are 92-49 years old, get at least 1,000 mg of calcium and at least 600 mg of vitamin D per day.  If you are older than age 40 but younger than age 50, get at least 1,200 mg of calcium and at least 600 mg of vitamin D per day.  If you are older than age 6, get at least 1,200 mg of calcium and at least 800 mg of vitamin D per day. Smoking and excessive alcohol intake increase the risk of osteoporosis. Eat foods that are rich in calcium and vitamin D, and do weight-bearing exercises several times each week as directed by your health care provider. What should I know about how menopause affects my mental health? Depression may occur at any age, but it is more common as you become older. Common symptoms of depression include:  Low or sad mood.  Changes in sleep patterns.  Changes in appetite or eating patterns.  Feeling an overall lack of motivation or enjoyment of activities that you previously enjoyed.  Frequent crying spells. Talk with your health care provider if you think that you are experiencing depression. What should I know about immunizations? It is important that you get and maintain your immunizations. These include:  Tetanus, diphtheria, and pertussis (Tdap) booster vaccine.  Influenza every year before the flu season begins.  Pneumonia vaccine.  Shingles vaccine. Your health care provider may also recommend other immunizations. This information is not intended to replace advice given to you by your health care provider. Make sure you discuss any questions you have with your health care provider. Document  Released: 02/08/2006 Document Revised: 07/06/2016 Document Reviewed: 09/20/2015 Elsevier Interactive Patient Education  2017 [place] Prevention in the Home Falls can cause injuries and can affect people from all age groups. There are many simple things that you can do to make your home safe and to help prevent falls. What can I do on the outside of my home?  Regularly repair the edges of walkways and driveways and fix any cracks.  Remove high doorway thresholds.  Trim any shrubbery on the main path into your home.  Use bright outdoor lighting.  Clear walkways of debris and clutter, including tools and rocks.  Regularly check that handrails are securely fastened and in good repair. Both sides of any steps should have handrails.  Install guardrails along the edges of any raised decks or porches.  Have leaves, snow, and ice cleared regularly.  Use sand or salt on walkways during winter months.  In the garage, clean up any spills right away, including grease or oil spills. What can I do in the bathroom?  Use night lights.  Install grab bars  by the toilet and in the tub and shower. Do not use towel bars as grab bars.  Use non-skid mats or decals on the floor of the tub or shower.  If you need to sit down while you are in the shower, use a plastic, non-slip stool.  Keep the floor dry. Immediately clean up any water that spills on the floor.  Remove soap buildup in the tub or shower on a regular basis.  Attach bath mats securely with double-sided non-slip rug tape.  Remove throw rugs and other tripping hazards from the floor. What can I do in the bedroom?  Use night lights.  Make sure that a bedside light is easy to reach.  Do not use oversized bedding that drapes onto the floor.  Have a firm chair that has side arms to use for getting dressed.  Remove throw rugs and other tripping hazards from the floor. What can I do in the kitchen?  Clean up any spills  right away.  Avoid walking on wet floors.  Place frequently used items in easy-to-reach places.  If you need to reach for something above you, use a sturdy step stool that has a grab bar.  Keep electrical cables out of the way.  Do not use floor polish or wax that makes floors slippery. If you have to use wax, make sure that it is non-skid floor wax.  Remove throw rugs and other tripping hazards from the floor. What can I do in the stairways?  Do not leave any items on the stairs.  Make sure that there are handrails on both sides of the stairs. Fix handrails that are broken or loose. Make sure that handrails are as long as the stairways.  Check any carpeting to make sure that it is firmly attached to the stairs. Fix any carpet that is loose or worn.  Avoid having throw rugs at the top or bottom of stairways, or secure the rugs with carpet tape to prevent them from moving.  Make sure that you have a light switch at the top of the stairs and the bottom of the stairs. If you do not have them, have them installed. What are some other fall prevention tips?  Wear closed-toe shoes that fit well and support your feet. Wear shoes that have rubber soles or low heels.  When you use a stepladder, make sure that it is completely opened and that the sides are firmly locked. Have someone hold the ladder while you are using it. Do not climb a closed stepladder.  Add color or contrast paint or tape to grab bars and handrails in your home. Place contrasting color strips on the first and last steps.  Use mobility aids as needed, such as canes, walkers, scooters, and crutches.  Turn on lights if it is dark. Replace any light bulbs that burn out.  Set up furniture so that there are clear paths. Keep the furniture in the same spot.  Fix any uneven floor surfaces.  Choose a carpet design that does not hide the edge of steps of a stairway.  Be aware of any and all pets.  Review your medicines  with your healthcare provider. Some medicines can cause dizziness or changes in blood pressure, which increase your risk of falling. Talk with your health care provider about other ways that you can decrease your risk of falls. This may include working with a physical therapist or trainer to improve your strength, balance, and endurance. This information is not  intended to replace advice given to you by your health care provider. Make sure you discuss any questions you have with your health care provider. Document Released: 12/07/2002 Document Revised: 05/15/2016 Document Reviewed: 01/21/2015 Elsevier Interactive Patient Education  2017 [place]. Pneumococcal Polysaccharide Vaccine: What You Need to Know 1. Why get vaccinated? Vaccination can protect older adults (and some children and younger adults) from pneumococcal disease. Pneumococcal disease is caused by bacteria that can spread from person to person through close contact. It can cause ear infections, and it can also lead to more serious infections of the:  Lungs (pneumonia),  Blood (bacteremia), and  Covering of the brain and spinal cord (meningitis). Meningitis can cause deafness and brain damage, and it can be fatal. Anyone can get pneumococcal disease, but children under 22 years of age, people with certain medical conditions, adults over 67 years of age, and cigarette smokers are at the highest risk. About 18,000 older adults die each year from pneumococcal disease in the [place]. Treatment of pneumococcal infections with penicillin and other drugs used to be more effective. But some strains of the disease have become resistant to these drugs. This makes prevention of the disease, through vaccination, even more important. 2. Pneumococcal polysaccharide vaccine (PPSV23) Pneumococcal polysaccharide vaccine (PPSV23) protects against 23 types of pneumococcal bacteria. It will not prevent all pneumococcal disease. PPSV23 is  recommended for:  All adults 65 years of age and older,  Anyone 2 through 75 years of age with certain long-term health problems,  Anyone 2 through 75 years of age with a weakened immune system,  Adults 70 through 75 years of age who smoke cigarettes or have asthma. Most people need only one dose of PPSV. A second dose is recommended for certain high-risk groups. People 47 and older should get a dose even if they have gotten one or more doses of the vaccine before they turned 65. Your healthcare provider can give you more information about these recommendations. Most healthy adults develop protection within 2 to 3 weeks of getting the shot. 3. Some people should not get this vaccine  Anyone who has had a life-threatening allergic reaction to PPSV should not get another dose.  Anyone who has a severe allergy to any component of PPSV should not receive it. Tell your provider if you have any severe allergies.  Anyone who is moderately or severely ill when the shot is scheduled may be asked to wait until they recover before getting the vaccine. Someone with a mild illness can usually be vaccinated.  Children less than 48 years of age should not receive this vaccine.  There is no evidence that PPSV is harmful to either a pregnant woman or to her fetus. However, as a precaution, women who need the vaccine should be vaccinated before becoming pregnant, if possible. 4. Risks of a vaccine reaction With any medicine, including vaccines, there is a chance of side effects. These are usually mild and go away on their own, but serious reactions are also possible. About half of people who get PPSV have mild side effects, such as redness or pain where the shot is given, which go away within about two days. Less than 1 out of 100 people develop a fever, muscle aches, or more severe local reactions. Problems that could happen after any vaccine:   People sometimes faint after a medical procedure, including  vaccination. Sitting or lying down for about 15 minutes can help prevent fainting, and injuries caused by a fall.  Tell your doctor if you feel dizzy, or have vision changes or ringing in the ears.  Some people get severe pain in the shoulder and have difficulty moving the arm where a shot was given. This happens very rarely.  Any medication can cause a severe allergic reaction. Such reactions from a vaccine are very rare, estimated at about 1 in a million doses, and would happen within a few minutes to a few hours after the vaccination. As with any medicine, there is a very remote chance of a vaccine causing a serious injury or death. The safety of vaccines is always being monitored. For more information, visit: http://www.aguilar.org/ 5. What if there is a serious reaction? What should I look for?  Look for anything that concerns you, such as signs of a severe allergic reaction, very high fever, or unusual behavior. Signs of a severe allergic reaction can include hives, swelling of the face and throat, difficulty breathing, a fast heartbeat, dizziness, and weakness. These would usually start a few minutes to a few hours after the vaccination. What should I do?  If you think it is a severe allergic reaction or other emergency that can't wait, call 9-1-1 or get to the nearest hospital. Otherwise, call your doctor. Afterward, the reaction should be reported to the Vaccine Adverse Event Reporting System (VAERS). Your doctor might file this report, or you can do it yourself through the VAERS web site at www.vaers.SamedayNews.es, or by calling (870)489-6604. VAERS does not give medical advice.  6. How can I learn more?  Ask your doctor. He or she can give you the vaccine package insert or suggest other sources of information.  Call your local or state health department.  Contact the Centers for Disease Control and Prevention (CDC):  Call 463-595-4352 (1-800-CDC-INFO) or  Visit CDC's website at  http://hunter.com/ CDC Pneumococcal Polysaccharide Vaccine VIS (04/23/14) This information is not intended to replace advice given to you by your health care provider. Make sure you discuss any questions you have with your health care provider. Document Released: 10/14/2006 Document Revised: 09/06/2016 Document Reviewed: 09/06/2016 Elsevier Interactive Patient Education  2017 [employer].

## 2017-04-18 NOTE — Assessment & Plan Note (Signed)
Stable on current regimen. Continue current regimen. Continue to monitor. Call with any concerns.  

## 2017-04-18 NOTE — Assessment & Plan Note (Signed)
Has not thought about it- information given.

## 2017-04-18 NOTE — Assessment & Plan Note (Signed)
Has not been an issue since changing medicine. Continue to monitor.

## 2017-04-18 NOTE — Assessment & Plan Note (Signed)
Under fair control. Continue current regimen. Continue to monitor. Call with any concerns.  

## 2017-04-18 NOTE — Assessment & Plan Note (Signed)
Continue to follow with dermatology. Call with any concerns.  

## 2017-04-18 NOTE — Assessment & Plan Note (Signed)
Stable. Continue current regimen. Continue to monitor. Call with any concerns. Refills given.  

## 2017-04-18 NOTE — Assessment & Plan Note (Signed)
Under good control. Continue current regimen. Continue to monitor. Call with any concerns. Refills given.  

## 2017-04-19 ENCOUNTER — Encounter: Payer: Self-pay | Admitting: Family Medicine

## 2017-04-19 LAB — CBC WITH DIFFERENTIAL/PLATELET
BASOS ABS: 0 10*3/uL (ref 0.0–0.2)
Basos: 0 %
EOS (ABSOLUTE): 0.1 10*3/uL (ref 0.0–0.4)
Eos: 1 %
Hematocrit: 38.3 % (ref 34.0–46.6)
Hemoglobin: 12.2 g/dL (ref 11.1–15.9)
Immature Grans (Abs): 0 10*3/uL (ref 0.0–0.1)
Immature Granulocytes: 0 %
LYMPHS ABS: 2.1 10*3/uL (ref 0.7–3.1)
LYMPHS: 42 %
MCH: 27.1 pg (ref 26.6–33.0)
MCHC: 31.9 g/dL (ref 31.5–35.7)
MCV: 85 fL (ref 79–97)
MONOCYTES: 6 %
Monocytes Absolute: 0.3 10*3/uL (ref 0.1–0.9)
NEUTROS ABS: 2.5 10*3/uL (ref 1.4–7.0)
Neutrophils: 51 %
PLATELETS: 128 10*3/uL — AB (ref 150–379)
RBC: 4.51 x10E6/uL (ref 3.77–5.28)
RDW: 15.9 % — ABNORMAL HIGH (ref 12.3–15.4)
WBC: 5 10*3/uL (ref 3.4–10.8)

## 2017-04-19 LAB — COMPREHENSIVE METABOLIC PANEL
ALK PHOS: 84 IU/L (ref 39–117)
ALT: 21 IU/L (ref 0–32)
AST: 33 IU/L (ref 0–40)
Albumin/Globulin Ratio: 1.5 (ref 1.2–2.2)
Albumin: 4.2 g/dL (ref 3.5–4.8)
BILIRUBIN TOTAL: 0.7 mg/dL (ref 0.0–1.2)
BUN / CREAT RATIO: 13 (ref 12–28)
BUN: 12 mg/dL (ref 8–27)
CHLORIDE: 102 mmol/L (ref 96–106)
CO2: 25 mmol/L (ref 18–29)
CREATININE: 0.9 mg/dL (ref 0.57–1.00)
Calcium: 9.3 mg/dL (ref 8.7–10.3)
GFR calc Af Amer: 73 mL/min/{1.73_m2} (ref >59)
GFR calc non Af Amer: 63 mL/min/{1.73_m2} (ref >59)
GLUCOSE: 106 mg/dL — AB (ref 65–99)
Globulin, Total: 2.8 g/dL (ref 1.5–4.5)
Potassium: 4.9 mmol/L (ref 3.5–5.2)
Sodium: 141 mmol/L (ref 134–144)
Total Protein: 7 g/dL (ref 6.0–8.5)

## 2017-04-19 LAB — LIPID PANEL W/O CHOL/HDL RATIO
Cholesterol, Total: 150 mg/dL (ref 100–199)
HDL: 45 mg/dL (ref >39)
LDL Calculated: 77 mg/dL (ref 0–99)
Triglycerides: 141 mg/dL (ref 0–149)
VLDL CHOLESTEROL CAL: 28 mg/dL (ref 5–40)

## 2017-04-19 LAB — TSH: TSH: 2.22 u[IU]/mL (ref 0.450–4.500)

## 2017-04-22 LAB — MICROSCOPIC EXAMINATION
Bacteria, UA: NONE SEEN
RBC, UA: NONE SEEN /hpf (ref 0–?)

## 2017-04-22 LAB — UA/M W/RFLX CULTURE, ROUTINE
BILIRUBIN UA: NEGATIVE
GLUCOSE, UA: NEGATIVE
KETONES UA: NEGATIVE
NITRITE UA: NEGATIVE
RBC, UA: NEGATIVE
Urobilinogen, Ur: 1 mg/dL (ref 0.2–1.0)
pH, UA: 6.5 (ref 5.0–7.5)

## 2017-04-22 LAB — URINE CULTURE, REFLEX

## 2017-04-22 LAB — MICROALBUMIN, URINE WAIVED
CREATININE, URINE WAIVED: 300 mg/dL (ref 10–300)
Microalb, Ur Waived: 80 mg/L — ABNORMAL HIGH (ref 0–19)

## 2017-04-22 LAB — BAYER DCA HB A1C WAIVED: HB A1C (BAYER DCA - WAIVED): 6.5 % (ref <7.0)

## 2017-05-01 ENCOUNTER — Other Ambulatory Visit: Payer: Self-pay | Admitting: Family Medicine

## 2017-05-01 DIAGNOSIS — Z79899 Other long term (current) drug therapy: Secondary | ICD-10-CM

## 2017-05-01 MED ORDER — ALPRAZOLAM 1 MG PO TABS: 1.0000 mg | ORAL_TABLET | Freq: Three times a day (TID) | ORAL | 0 refills | Status: DC | PRN

## 2017-05-14 ENCOUNTER — Other Ambulatory Visit: Payer: Medicare HMO

## 2017-05-14 DIAGNOSIS — Z79899 Other long term (current) drug therapy: Secondary | ICD-10-CM | POA: Diagnosis not present

## 2017-05-20 ENCOUNTER — Other Ambulatory Visit: Payer: Self-pay | Admitting: Family Medicine

## 2017-05-20 NOTE — Telephone Encounter (Signed)
Last OV: 04/18/17 Next OV: 07/23/17   Lab Results  Component Value Date   HGBA1C 6.5 04/18/2017

## 2017-05-21 LAB — DRUG SCREEN 764883 11+OXYCO+ALC+CRT-BUND
Amphetamines, Urine: NEGATIVE ng/mL
Barbiturate: NEGATIVE ng/mL
CANNABINOID QUANT UR: NEGATIVE ng/mL
COCAINE (METABOLITE): NEGATIVE ng/mL
Creatinine: 281 mg/dL (ref 20.0–300.0)
ETHANOL: NEGATIVE %
METHADONE SCREEN, URINE: NEGATIVE ng/mL
Meperidine: NEGATIVE ng/mL
OPIATE SCREEN URINE: NEGATIVE ng/mL
OXYCODONE+OXYMORPHONE UR QL SCN: NEGATIVE ng/mL
PHENCYCLIDINE: NEGATIVE ng/mL
Propoxyphene: NEGATIVE ng/mL
Tramadol: NEGATIVE ng/mL
pH, Urine: 7 (ref 4.5–8.9)

## 2017-05-21 LAB — BENZODIAZEPINES CONFIRM, URINE
ALPRAZOLAM: POSITIVE — AB
Alprazolam Conf.: 377 ng/mL
Benzodiazepines: POSITIVE ng/mL — AB
CLONAZEPAM: NEGATIVE
FLURAZEPAM: NEGATIVE
LORAZEPAM: NEGATIVE
Midazolam: NEGATIVE
Nordiazepam: NEGATIVE
Oxazepam: NEGATIVE
TEMAZEPAM: NEGATIVE
Triazolam: NEGATIVE

## 2017-05-29 ENCOUNTER — Other Ambulatory Visit: Payer: Self-pay | Admitting: Family Medicine

## 2017-05-29 ENCOUNTER — Encounter: Payer: Self-pay | Admitting: *Deleted

## 2017-05-29 ENCOUNTER — Emergency Department
Admission: EM | Admit: 2017-05-29 | Discharge: 2017-05-29 | Disposition: A | Discharge status: Home / Self Care | Payer: Commercial Managed Care - HMO | Attending: Emergency Medicine | Admitting: Emergency Medicine

## 2017-05-29 ENCOUNTER — Emergency Department: Payer: Commercial Managed Care - HMO

## 2017-05-29 DIAGNOSIS — W010XXA Fall on same level from slipping, tripping and stumbling without subsequent striking against object, initial encounter: Secondary | ICD-10-CM | POA: Diagnosis not present

## 2017-05-29 DIAGNOSIS — Y9301 Activity, walking, marching and hiking: Secondary | ICD-10-CM | POA: Diagnosis not present

## 2017-05-29 DIAGNOSIS — J45909 Unspecified asthma, uncomplicated: Secondary | ICD-10-CM | POA: Diagnosis not present

## 2017-05-29 DIAGNOSIS — Y929 Unspecified place or not applicable: Secondary | ICD-10-CM | POA: Insufficient documentation

## 2017-05-29 DIAGNOSIS — Z79899 Other long term (current) drug therapy: Secondary | ICD-10-CM | POA: Insufficient documentation

## 2017-05-29 DIAGNOSIS — Z7982 Long term (current) use of aspirin: Secondary | ICD-10-CM | POA: Insufficient documentation

## 2017-05-29 DIAGNOSIS — S6991XA Unspecified injury of right wrist, hand and finger(s), initial encounter: Secondary | ICD-10-CM | POA: Diagnosis not present

## 2017-05-29 DIAGNOSIS — E119 Type 2 diabetes mellitus without complications: Secondary | ICD-10-CM | POA: Insufficient documentation

## 2017-05-29 DIAGNOSIS — M25532 Pain in left wrist: Secondary | ICD-10-CM | POA: Diagnosis not present

## 2017-05-29 DIAGNOSIS — I10 Essential (primary) hypertension: Secondary | ICD-10-CM | POA: Insufficient documentation

## 2017-05-29 DIAGNOSIS — Y999 Unspecified external cause status: Secondary | ICD-10-CM | POA: Insufficient documentation

## 2017-05-29 DIAGNOSIS — S82842A Displaced bimalleolar fracture of left lower leg, initial encounter for closed fracture: Secondary | ICD-10-CM | POA: Insufficient documentation

## 2017-05-29 DIAGNOSIS — S99912A Unspecified injury of left ankle, initial encounter: Secondary | ICD-10-CM | POA: Diagnosis present

## 2017-05-29 MED ORDER — OXYCODONE-ACETAMINOPHEN 5-325 MG PO TABS: 1.0000 | ORAL_TABLET | Freq: Four times a day (QID) | ORAL | 0 refills | Status: AC | PRN

## 2017-05-29 MED ORDER — OXYCODONE-ACETAMINOPHEN 5-325 MG PO TABS
1.0000 | ORAL_TABLET | Freq: Once | ORAL | Status: AC
  Administered 2017-05-29: 1 via ORAL
  Filled 2017-05-29: qty 1

## 2017-05-29 MED ORDER — ALPRAZOLAM 1 MG PO TABS: 1.0000 mg | ORAL_TABLET | Freq: Three times a day (TID) | ORAL | 0 refills | Status: DC | PRN

## 2017-05-29 NOTE — ED Triage Notes (Signed)
States she slipped on her slick bottom shoes and fell, states left ankle pain, denies any LOC or hitting her head, deformity noted to left ankle, sensation intact

## 2017-05-29 NOTE — ED Provider Notes (Signed)
[facility] Emergency Department Provider Note  ____________________________________________  Time seen: Approximately 3:47 PM  I have reviewed the triage vital signs and the nursing notes.   HISTORY  Chief Complaint Ankle Pain    HPI Dawn Moore is a 76 y.o. female presenting to the emergency department with 2 out of 10 left ankle and left wrist pain. Patient states that she slipped while walking. She denies hitting her head. She denies numbness, tingling, coldness and loss of sensation of the left lower extremity and left upper extremity. Patient has been nonweightbearing since the fall. Patient had a prior surgery to the the left fifth metatarsal and the calcaneus. No alleviating measures attempted attempted.   Past Medical History:  Diagnosis Date  . Anxiety   . Arthritis    OA knees, hands and feet  . Depression   . Diabetes mellitus without complication ([place])   . GERD (gastroesophageal reflux disease)   . Headache   . Osteopenia   . Shortness of breath dyspnea   . Sleep apnea    doesn't use CPAP  . 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 Advanced care planning/counseling discussion 04/18/2017  . HTN (hypertension) 12/14/2016  . Transient alteration of awareness 05/01/2016  . Controlled substance agreement signed 11/29/2015  . Thrombocytopenia ([place]) 07/21/2015  . Depression 07/21/2015  . Osteopenia 07/21/2015  . 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 Generalized osteoarthritis 07/21/2015  . Hyperlipidemia 07/21/2015  . Asthma 07/21/2015  . Diabetic neuropathy ([place]) 07/21/2015  . GERD (gastroesophageal reflux disease) 07/21/2015  . Nonspecific elevation of levels of transaminase or lactic acid dehydrogenase (LDH) 07/21/2015  . 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 Anxiety disorder 07/07/2015  .  Candidal intertrigo 07/07/2015    Past Surgical History:  Procedure Laterality Date  . ABDOMINAL HYSTERECTOMY  1986   still has one ovary  . CATARACT EXTRACTION W/PHACO Left 03/07/2016   Procedure: CATARACT EXTRACTION PHACO AND INTRAOCULAR LENS PLACEMENT (IOC);  Surgeon: Leandrew Koyanagi, MD;  Location: [place];  Service: Ophthalmology;  Laterality: Left;  DIABETIC -oral meds Sleep apnea  . CATARACT EXTRACTION W/PHACO Right 03/28/2016   Procedure: CATARACT EXTRACTION PHACO AND INTRAOCULAR LENS PLACEMENT (IOC);  Surgeon: Leandrew Koyanagi, MD;  Location: [place];  Service: Ophthalmology;  Laterality: Right;  DIABETIC - oral meds  . CHOLECYSTECTOMY  1999  . COCHLEAR IMPLANT    . FOOT FRACTURE SURGERY    . HEEL SPUR SURGERY Left 11/04/09   Revision 01/2011  . INTRAOCULAR LENS INSERTION Bilateral     Prior to Admission medications   Medication Sig Start Date End Date Taking? Authorizing Provider  acetaminophen (TYLENOL) 500 MG tablet Take 500 mg by mouth 2 (two) times daily.    [provider]  ALPRAZolam Duanne Moron) 1 MG tablet Take 1 tablet (1 mg total) by mouth 3 (three) times daily as needed for anxiety. 05/29/17   Johnson, Megan P, DO  Ascorbic Acid (VITAMIN C) 100 MG tablet Take 100 mg by mouth daily. Reported on 02/28/2016    [provider]  aspirin EC 81 MG tablet Take 81 mg by mouth. Reported on 02/28/2016 11/05/14 07/14/28  [provider]  Cholecalciferol (D 1000) 1000 UNITS capsule Take by mouth.    [provider]  gabapentin (NEURONTIN) 100 MG capsule TAKE 1 CAPSULE TWICE DAILY 05/20/17  Johnson, Megan P, DO  lisinopril (PRINIVIL,ZESTRIL) 5 MG tablet Take 1 tablet (5 mg total) by mouth daily. 03/05/17   Johnson, Megan P, DO  metFORMIN (GLUCOPHAGE) 1000 MG tablet TAKE 1/2 TABLET TWICE DAILY WITH MEALS 04/18/17   Johnson, Megan P, DO  Multiple Vitamins-Minerals (HAIR SKIN AND NAILS FORMULA) TABS Take by mouth once.    [provider]  naproxen (NAPROSYN) 500 MG tablet Take 1 tablet (500 mg total) by mouth 2 (two) times daily with a meal. 03/05/17   Johnson, Megan P, DO  omeprazole (PRILOSEC) 20 MG capsule Take 1 capsule (20 mg total) by mouth daily. 04/18/17   Johnson, Megan P, DO  oxyCODONE-acetaminophen (ROXICET) 5-325 MG tablet Take 1 tablet by mouth every 6 (six) hours as needed. 05/29/17 06/03/17  Vallarie Mare M, PA-C  QUEtiapine (SEROQUEL) 25 MG tablet TAKE 1 TABLET AT BEDTIME 04/08/17   Johnson, Megan P, DO  simvastatin (ZOCOR) 10 MG tablet TAKE 1 TABLET EVERY EVENING 04/08/17   Johnson, Megan P, DO  venlafaxine XR (EFFEXOR-XR) 150 MG 24 hr capsule TAKE 1 CAPSULE EVERY DAY (FOR TOTAL OF 225MG  PER DAY) 04/18/17   Johnson, Megan P, DO  venlafaxine XR (EFFEXOR-XR) 75 MG 24 hr capsule TAKE 1 CAPSULE DAILY WITH BREAKFAST (TOTAL DAILY DOSE IS 225MG ) 03/11/17   Johnson, Megan P, DO    Allergies Nsaids and Wellbutrin [bupropion]  Family History  Problem Relation Age of Onset  . Stroke Mother   . Dementia Mother   . Cancer Father        prostate  . Hyperlipidemia Son   . Hypertension Son   . Mental illness Son   . Alcohol abuse Son   . Arthritis Sister        rheumatoid  . Cancer Sister        breast  . Thyroid disease Sister   . Cancer Sister        thyroid  . Cancer Brother        prostate  . Arthritis Maternal Grandmother     Social History Social History  Substance Use Topics  . Smoking status: Never Smoker  . Smokeless tobacco: Never Used  . Alcohol use No     Review of Systems  Constitutional: No fever/chills Eyes: No visual changes. No discharge ENT: No upper respiratory complaints. Cardiovascular: no chest pain. Respiratory: no cough. No SOB. Musculoskeletal: Patient has left ankle pain and left wrist pain. Skin: Negative for rash, abrasions, lacerations, ecchymosis. Neurological: Negative for headaches, focal weakness or  numbness.  ____________________________________________   PHYSICAL EXAM:  VITAL SIGNS: ED Triage Vitals  Enc Vitals Group     BP 05/29/17 1436 (!) 149/77     Pulse Rate 05/29/17 1436 99     Resp 05/29/17 1436 18     Temp 05/29/17 1436 98.5 F (36.9 C)     Temp Source 05/29/17 1436 Oral     SpO2 05/29/17 1436 96 %     Weight 05/29/17 1431 225 lb (102.1 kg)     Height 05/29/17 1431 5\' 6"  (1.676 m)     Head Circumference --      Peak Flow --      Pain Score 05/29/17 1431 2     Pain Loc --      Pain Edu? --      Excl. in [place]? --      Constitutional: Alert and oriented. Well appearing and in no acute distress. Eyes: Conjunctivae are normal. PERRL. EOMI.  Head: Atraumatic. Cardiovascular: Normal rate, regular rhythm. Normal S1 and S2.  Good peripheral circulation. Respiratory: Normal respiratory effort without tachypnea or retractions. Lungs CTAB. Good air entry to the bases with no decreased or absent breath sounds. Musculoskeletal: Patient has 5 out of 5 strength in the upper and lower extremities bilaterally. Patient is able to perform flexion and extension at the left wrist. She can move all 5 left fingers. She has tenderness to palpation over the left first metacarpal. No tenderness over the anatomical snuffbox, left. Patient has limited plantar flexion and dorsi flexion of the left ankle, likely secondary to pain and injury. Patient is able to move all 5 left toes. Patient has a palpable radial, ulnar and dorsalis pedis pulses bilaterally and symmetrically.  Neurologic:  Normal speech and language. No gross focal neurologic deficits are appreciated.  Skin:  Skin is warm, dry and intact. No rash noted. Psychiatric: Mood and affect are normal. Speech and behavior are normal. Patient exhibits appropriate insight and judgement.   ____________________________________________   LABS (all labs ordered are listed, but only abnormal results are displayed)  Labs Reviewed - No data to  display ____________________________________________  EKG   ____________________________________________  RADIOLOGY Unk Pinto, personally viewed and evaluated these images (plain radiographs) as part of my medical decision making, as well as reviewing the written report by the radiologist.  Dg Wrist Complete Left  Result Date: 05/29/2017 CLINICAL DATA:  Status post fall earlier today with pain in the wrist radiating into the thumb. EXAM: LEFT WRIST - COMPLETE 3+ VIEW COMPARISON:  None in PACs FINDINGS: The bones are subjectively adequately mineralized. There is mild narrowing of the radiocarpal joint. There is calcification within the soft tissues of the ulnocarpal joint. There is mild narrowing of the articulation of the scaphoid with the trapezium and trapezoid. There is moderate joint space loss of the first CMC joint. There is mild diffuse soft tissue swelling. The metacarpals are intact where visualized. IMPRESSION: There are degenerative changes as described. There is no acute fracture nor dislocation. Electronically Signed   By: David  Martinique M.D.   On: 05/29/2017 15:25   Dg Ankle Complete Left  Result Date: 05/29/2017 CLINICAL DATA:  Ankle pain.  Fall EXAM: LEFT ANKLE COMPLETE - 3+ VIEW COMPARISON:  None. FINDINGS: Acute intra-articular fracture deformity involves the distal fibula. A second fracture deformity involves the medial malleolus. This appears slightly comminuted. Suture anchors are noted within the calcaneus. Katie wires and cerclage wires are identified at the base of the fifth metatarsal bone. IMPRESSION: 1. Acute bimalleolar fractures are identified. 2. Previous hardware fixation of the fifth metatarsal bone. Electronically Signed   By: Kerby Moors M.D.   On: 05/29/2017 15:26    ____________________________________________    PROCEDURES  Procedure(s) performed:    Procedures    Medications  oxyCODONE-acetaminophen (PERCOCET/ROXICET) 5-325 MG per  tablet 1 tablet (1 tablet Oral Given 05/29/17 1601)     ____________________________________________   INITIAL IMPRESSION / ASSESSMENT AND PLAN / ED COURSE  Pertinent labs & imaging results that were available during my care of the patient were reviewed by me and considered in my medical decision making (see chart for details).  Review of the [place] CSRS was performed in accordance of the [place] prior to dispensing any controlled drugs.    Assessment and plan: Bimalleolar ankle fracture Left wrist pain Patient presents the emergency department after slipping and falling while walking. Patient reports left ankle and left wrist pain. DG left ankle  reveals a bimalleolar ankle fracture that does not need reduction. Skin overlying left ankle was warm with a palpable dorsalis pedis pulse. DG left wrist reveals no acute fractures or bony abnormalities. A splint was applied in the emergency department to both left ankle and left wrist. Patient is adamant that she would like to seek further orthopedic care with Dr. Melrose Nakayama. I spoke with Dr. Jerald Kief care team to establish an appointment for patient to have follow-up care. Patient was given Roxicet the emergency department. She was discharged with Roxicet for pain. All patient questions were answered.    ____________________________________________  FINAL CLINICAL IMPRESSION(S) / ED DIAGNOSES  Final diagnoses:  Closed bimalleolar fracture of left ankle, initial encounter      NEW MEDICATIONS STARTED DURING THIS VISIT:  New Prescriptions   OXYCODONE-ACETAMINOPHEN (ROXICET) 5-325 MG TABLET    Take 1 tablet by mouth every 6 (six) hours as needed.        This chart was dictated using voice recognition software/Dragon. Despite best efforts to proofread, errors can occur which can change the meaning. Any change was purely unintentional.    Lannie Fields, PA-C 05/29/17 1603    Hinda Kehr, MD 05/29/17 [phone]

## 2017-05-29 NOTE — ED Notes (Addendum)
See triage note  States she slipped going down a ramp  Twisted left ankle and landed on left wrist  Min swelling noted  Positive pulses

## 2017-05-30 DIAGNOSIS — S82842A Displaced bimalleolar fracture of left lower leg, initial encounter for closed fracture: Secondary | ICD-10-CM | POA: Diagnosis not present

## 2017-05-30 DIAGNOSIS — M25532 Pain in left wrist: Secondary | ICD-10-CM | POA: Diagnosis not present

## 2017-06-05 ENCOUNTER — Other Ambulatory Visit: Payer: Self-pay | Admitting: Family Medicine

## 2017-06-05 DIAGNOSIS — S82842D Displaced bimalleolar fracture of left lower leg, subsequent encounter for closed fracture with routine healing: Secondary | ICD-10-CM | POA: Diagnosis not present

## 2017-06-12 DIAGNOSIS — S82842D Displaced bimalleolar fracture of left lower leg, subsequent encounter for closed fracture with routine healing: Secondary | ICD-10-CM | POA: Diagnosis not present

## 2017-06-13 ENCOUNTER — Telehealth: Payer: Self-pay | Admitting: Family Medicine

## 2017-06-13 NOTE — Telephone Encounter (Signed)
Patient's daughter calling in regards to patient needing prior authorization for a knee scooter.   Please Advise.  Thank you

## 2017-06-13 NOTE — Telephone Encounter (Signed)
Spoke with patient's daughter, she states that the knee scooter needs authorization.  Phone number: [phone] Fax: [phone]  Called the number, states that they are experiencing technical difficulties, please try again later.  Will try again.

## 2017-06-17 NOTE — Telephone Encounter (Signed)
Xanax called into prescriber voicemail.

## 2017-06-17 NOTE — Telephone Encounter (Signed)
Agricultural engineer and spoke with [employer], they stated that the DME company could call directly and speak with them to get the process started.  Reference Number: [id number]    Called to notify [place] to contact [facility].   Dr.Johnson: Are you able to send in the patients Xanax since she fell and broke her ankle or does she have to do the urine drug screen this month?

## 2017-06-17 NOTE — Telephone Encounter (Signed)
Go ahead and call in her xanax- just have Christan void the Rx up in the folder. Thanks!

## 2017-06-19 DIAGNOSIS — S82842D Displaced bimalleolar fracture of left lower leg, subsequent encounter for closed fracture with routine healing: Secondary | ICD-10-CM | POA: Diagnosis not present

## 2017-06-26 ENCOUNTER — Other Ambulatory Visit: Payer: Self-pay | Admitting: Family Medicine

## 2017-06-26 MED ORDER — ALPRAZOLAM 1 MG PO TABS: 1.0000 mg | ORAL_TABLET | Freq: Three times a day (TID) | ORAL | 0 refills | Status: DC | PRN

## 2017-07-04 DIAGNOSIS — S82842D Displaced bimalleolar fracture of left lower leg, subsequent encounter for closed fracture with routine healing: Secondary | ICD-10-CM | POA: Diagnosis not present

## 2017-07-11 ENCOUNTER — Emergency Department: Payer: Medicare HMO

## 2017-07-11 ENCOUNTER — Encounter: Payer: Self-pay | Admitting: Emergency Medicine

## 2017-07-11 ENCOUNTER — Emergency Department
Admission: EM | Admit: 2017-07-11 | Discharge: 2017-07-11 | Disposition: A | Discharge status: Home / Self Care | Payer: Medicare HMO | Attending: Emergency Medicine | Admitting: Emergency Medicine

## 2017-07-11 DIAGNOSIS — Z79891 Long term (current) use of opiate analgesic: Secondary | ICD-10-CM | POA: Diagnosis not present

## 2017-07-11 DIAGNOSIS — Y999 Unspecified external cause status: Secondary | ICD-10-CM | POA: Diagnosis not present

## 2017-07-11 DIAGNOSIS — W010XXA Fall on same level from slipping, tripping and stumbling without subsequent striking against object, initial encounter: Secondary | ICD-10-CM | POA: Insufficient documentation

## 2017-07-11 DIAGNOSIS — J45909 Unspecified asthma, uncomplicated: Secondary | ICD-10-CM | POA: Diagnosis not present

## 2017-07-11 DIAGNOSIS — Z7982 Long term (current) use of aspirin: Secondary | ICD-10-CM | POA: Insufficient documentation

## 2017-07-11 DIAGNOSIS — S32040A Wedge compression fracture of fourth lumbar vertebra, initial encounter for closed fracture: Secondary | ICD-10-CM | POA: Insufficient documentation

## 2017-07-11 DIAGNOSIS — Z7984 Long term (current) use of oral hypoglycemic drugs: Secondary | ICD-10-CM | POA: Insufficient documentation

## 2017-07-11 DIAGNOSIS — Y939 Activity, unspecified: Secondary | ICD-10-CM | POA: Diagnosis not present

## 2017-07-11 DIAGNOSIS — S32050A Wedge compression fracture of fifth lumbar vertebra, initial encounter for closed fracture: Secondary | ICD-10-CM | POA: Insufficient documentation

## 2017-07-11 DIAGNOSIS — Z85828 Personal history of other malignant neoplasm of skin: Secondary | ICD-10-CM | POA: Diagnosis not present

## 2017-07-11 DIAGNOSIS — Y929 Unspecified place or not applicable: Secondary | ICD-10-CM | POA: Diagnosis not present

## 2017-07-11 DIAGNOSIS — I10 Essential (primary) hypertension: Secondary | ICD-10-CM | POA: Diagnosis not present

## 2017-07-11 DIAGNOSIS — M8580 Other specified disorders of bone density and structure, unspecified site: Secondary | ICD-10-CM | POA: Diagnosis not present

## 2017-07-11 DIAGNOSIS — E1140 Type 2 diabetes mellitus with diabetic neuropathy, unspecified: Secondary | ICD-10-CM | POA: Diagnosis not present

## 2017-07-11 DIAGNOSIS — S3992XA Unspecified injury of lower back, initial encounter: Secondary | ICD-10-CM | POA: Diagnosis present

## 2017-07-11 DIAGNOSIS — M545 Low back pain, unspecified: Secondary | ICD-10-CM

## 2017-07-11 MED ORDER — OXYCODONE HCL 5 MG PO TABS
5.0000 mg | ORAL_TABLET | Freq: Once | ORAL | Status: AC
  Administered 2017-07-11: 5 mg via ORAL
  Filled 2017-07-11: qty 1

## 2017-07-11 MED ORDER — OXYCODONE HCL 5 MG PO TABS: 5.0000 mg | ORAL_TABLET | Freq: Four times a day (QID) | ORAL | 0 refills | Status: DC | PRN

## 2017-07-11 NOTE — ED Provider Notes (Signed)
[facility] Provider Note   CSN: [id number] Arrival date & time: 07/11/17  1811     History   Chief Complaint Chief Complaint  Patient presents with  . Back Pain  . Fall    HPI Dawn Moore is a 76 y.o. female presents to the emergency department for evaluation of lower back pain. Patient was at her home around 11:45 AM this morning when she bent over to pick up her wallet off the floor, as she stood up she lost her balance and fell backwards onto her buttocks. She is having midline lower back pain that is severe. She took 2 Norco tablets around noon after the accident and has not seen any improvement. She denies any numbness tingling or radicular symptoms. Patient has suffered a fracture to her left foot 6 weeks ago and is currently in a walking boot, weightbearing as tolerated with a cane. She states the foot fracture is what caused her to stumble backwards. She denies hitting her head, losing consciousness, nausea, vomiting, neck or mid back pain. She denies any chest pain or shortness of breath. No loss of bowel or bladder symptoms.  HPI  Past Medical History:  Diagnosis Date  . Anxiety   . Arthritis    OA knees, hands and feet  . Depression   . Diabetes mellitus without complication ([place])   . GERD (gastroesophageal reflux disease)   . Headache   . Osteopenia   . Shortness of breath dyspnea   . Sleep apnea    doesn't use CPAP  . 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 Advanced care planning/counseling discussion 04/18/2017  . HTN (hypertension) 12/14/2016  . Transient alteration of awareness 05/01/2016  . Controlled substance agreement signed 11/29/2015  . Thrombocytopenia ([place]) 07/21/2015  . Depression 07/21/2015  . Osteopenia 07/21/2015  . Diabetes mellitus with renal manifestations, controlled ([place]) 07/21/2015  . Generalized osteoarthritis 07/21/2015  . Hyperlipidemia 07/21/2015  .  Asthma 07/21/2015  . Diabetic neuropathy ([place]) 07/21/2015  . GERD (gastroesophageal reflux disease) 07/21/2015  . Nonspecific elevation of levels of transaminase or lactic acid dehydrogenase (LDH) 07/21/2015  . 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 Anxiety disorder 07/07/2015  . Candidal intertrigo 07/07/2015    Past Surgical History:  Procedure Laterality Date  . ABDOMINAL HYSTERECTOMY  1986   still has one ovary  . CATARACT EXTRACTION W/PHACO Left 03/07/2016   Procedure: CATARACT EXTRACTION PHACO AND INTRAOCULAR LENS PLACEMENT (IOC);  Surgeon: Leandrew Koyanagi, MD;  Location: [facility];  Service: Ophthalmology;  Laterality: Left;  DIABETIC -oral meds Sleep apnea  . CATARACT EXTRACTION W/PHACO Right 03/28/2016   Procedure: CATARACT EXTRACTION PHACO AND INTRAOCULAR LENS PLACEMENT (IOC);  Surgeon: Leandrew Koyanagi, MD;  Location: [facility];  Service: Ophthalmology;  Laterality: Right;  DIABETIC - oral meds  . CHOLECYSTECTOMY  1999  . COCHLEAR IMPLANT    . FOOT FRACTURE SURGERY    . HEEL SPUR SURGERY Left 11/04/09   Revision 01/2011  . INTRAOCULAR LENS INSERTION Bilateral     OB History    No data available       Home Medications    Prior to Admission medications   Medication Sig Start Date End Date Taking? Authorizing Provider  HYDROcodone-acetaminophen (NORCO/VICODIN) 5-325 MG tablet Take 1 tablet by mouth every 6 (six) hours as needed for moderate pain.   Yes [provider]  acetaminophen (TYLENOL) 500 MG tablet Take 500 mg by mouth 2 (two) times daily.    [provider]  ALPRAZolam Duanne Moron) 1 MG tablet Take 1 tablet (1 mg total) by mouth 3 (three) times daily as needed for anxiety. 06/26/17   Johnson, Megan P, DO  Ascorbic Acid (VITAMIN C) 100 MG tablet Take 100 mg by mouth daily. Reported on 02/28/2016    [provider]  aspirin EC 81 MG tablet Take 81 mg by mouth. Reported on  02/28/2016 11/05/14 07/14/28  [provider]  Cholecalciferol (D 1000) 1000 UNITS capsule Take by mouth.    [provider]  gabapentin (NEURONTIN) 100 MG capsule TAKE 1 CAPSULE TWICE DAILY 05/20/17   Johnson, Megan P, DO  lisinopril (PRINIVIL,ZESTRIL) 5 MG tablet Take 1 tablet (5 mg total) by mouth daily. 03/05/17   Johnson, Megan P, DO  metFORMIN (GLUCOPHAGE) 1000 MG tablet TAKE 1/2 TABLET TWICE DAILY WITH MEALS 04/18/17   Johnson, Megan P, DO  Multiple Vitamins-Minerals (HAIR SKIN AND NAILS FORMULA) TABS Take by mouth once.    [provider]  naproxen (NAPROSYN) 500 MG tablet Take 1 tablet (500 mg total) by mouth 2 (two) times daily with a meal. 03/05/17   Johnson, Megan P, DO  omeprazole (PRILOSEC) 20 MG capsule Take 1 capsule (20 mg total) by mouth daily. 04/18/17   Johnson, Megan P, DO  oxyCODONE (ROXICODONE) 5 MG immediate release tablet Take 1 tablet (5 mg total) by mouth every 6 (six) hours as needed. 07/11/17 07/11/18  Duanne Guess, PA-C  QUEtiapine (SEROQUEL) 25 MG tablet TAKE 1 TABLET AT BEDTIME 04/08/17   Johnson, Megan P, DO  simvastatin (ZOCOR) 10 MG tablet TAKE 1 TABLET EVERY EVENING 04/08/17   Johnson, Megan P, DO  venlafaxine XR (EFFEXOR-XR) 150 MG 24 hr capsule TAKE 1 CAPSULE EVERY DAY (FOR TOTAL OF 225MG  PER DAY) 04/18/17   Johnson, Megan P, DO  venlafaxine XR (EFFEXOR-XR) 75 MG 24 hr capsule TAKE 1 CAPSULE DAILY WITH BREAKFAST (TOTAL DAILY DOSE IS 225MG ) 03/11/17   Valerie Roys, DO    Family History Family History  Problem Relation Age of Onset  . Stroke Mother   . Dementia Mother   . Cancer Father        prostate  . Hyperlipidemia Son   . Hypertension Son   . Mental illness Son   . Alcohol abuse Son   . Arthritis Sister        rheumatoid  . Cancer Sister        breast  . Thyroid disease Sister   . Cancer Sister        thyroid  . Cancer Brother        prostate  . Arthritis Maternal Grandmother     Social History Social History  Substance  Use Topics  . Smoking status: Never Smoker  . Smokeless tobacco: Never Used  . Alcohol use No     Allergies   Nsaids and Wellbutrin [bupropion]   Review of Systems Review of Systems  Constitutional: Negative for fever.  HENT: Negative for congestion, sinus pressure and sore throat.   Eyes: Negative for visual disturbance.  Respiratory: Negative for cough, chest tightness and shortness of breath.   Cardiovascular: Negative for chest pain and leg swelling.  Gastrointestinal: Negative for abdominal pain, nausea and vomiting.  Genitourinary: Negative for dysuria.  Musculoskeletal: Positive for back pain. Negative for arthralgias, gait problem, joint swelling, myalgias, neck pain and neck stiffness.  Skin: Negative for rash and wound.  Neurological: Negative for weakness, numbness and headaches.  Hematological: Negative for adenopathy.  Psychiatric/Behavioral: Negative for agitation, behavioral problems and confusion.     Physical Exam Updated Vital Signs BP 132/71 (BP Location: Left Arm)   Pulse 80   Temp 98.3 F (36.8 C) (Oral)   Resp 18   Ht 5\' 6"  (1.676 m)   Wt 95.3 kg (210 lb)   SpO2 91%   BMI 33.89 kg/m   Physical Exam  Constitutional: She is oriented to person, place, and time. She appears well-developed and well-nourished.  HENT:  Head: Normocephalic and atraumatic.  Right Ear: External ear normal.  Left Ear: External ear normal.  Nose: Nose normal.  Eyes: Pupils are equal, round, and reactive to light. Conjunctivae and EOM are normal.  Neck: Normal range of motion.  Cardiovascular: Normal rate and regular rhythm.   Pulmonary/Chest: Effort normal and breath sounds normal. No respiratory distress.  Abdominal: Soft.  Musculoskeletal:  Examination of the cervical thoracic and lumbar spinous processes shows patient is tender along the lumbar spinous processes. There is no tenderness along the cervical thoracic spinous processes. She has tenderness on the left and  right paravertebral muscles of the lumbosacral junction. No tenderness along the sacral region or SI joints bilaterally. She has good hip internal or external rotation bilaterally with no discomfort.  Neurological: She is alert and oriented to person, place, and time.  Skin: Skin is warm. No rash noted. No erythema.  Psychiatric: She has a normal mood and affect. Her behavior is normal. Judgment and thought content normal.     ED Treatments / Results  Labs (all labs ordered are listed, but only abnormal results are displayed) Labs Reviewed - No data to display  EKG  EKG Interpretation None       Radiology Dg Lumbar Spine Complete  Result Date: 07/11/2017 CLINICAL DATA:  [place] backwards after bending down.  Low back pain. EXAM: LUMBAR SPINE - COMPLETE 4+ VIEW COMPARISON:  Lumbar spine radiographs March 10, 2014 FINDINGS: Mild L4 compression fracture with less than 25% height loss and endplate sclerosis. Mild L5 superior endplate compression fracture. Maintained alignment and lumbar lordosis. Intervertebral disc heights are normal. Osteopenia. No destructive bony lesions. Moderate lower lumbar facet arthropathy. Sacroiliac joints are symmetric. Included prevertebral and paraspinal soft tissue planes are non-suspicious. IMPRESSION: New mild L4 and L5 compression fractures, possibly acute L5. No malalignment. Electronically Signed   By: Elon Alas M.D.   On: 07/11/2017 19:41    Procedures Procedures (including critical care time)  Medications Ordered in ED Medications  oxyCODONE (Oxy IR/ROXICODONE) immediate release tablet 5 mg (5 mg Oral Given 07/11/17 1911)     Initial Impression / Assessment and Plan / ED Course  I have reviewed the triage vital signs and the nursing notes.  Pertinent labs & imaging results that were available during my care of the patient were reviewed by me and considered in my medical decision making (see chart for details).     76 year old female with  acute L4 and L5 compression fracture. Patient's pain improved with lying down and oxycodone. She will discontinue cane and start use of a walker. Patient is ambulatory with no neurological deficits. Patient will follow-up with orthopedist. She is given oxycodone as needed for pain. She will discontinue Norco.  Final Clinical Impressions(s) / ED Diagnoses   Final diagnoses:  Acute midline low back pain without sciatica  Closed compression fracture of fifth lumbar vertebra, initial  encounter [facility])  Closed compression fracture of fourth lumbar vertebra, initial encounter ([place])    New Prescriptions New Prescriptions   OXYCODONE (ROXICODONE) 5 MG IMMEDIATE RELEASE TABLET    Take 1 tablet (5 mg total) by mouth every 6 (six) hours as needed.     Duanne Guess, PA-C 07/11/17 2014    Rudene Re, MD 07/12/17 [id number]

## 2017-07-11 NOTE — ED Triage Notes (Signed)
Pt reports was bent down to pick something up from under a chair and fell backwards. Pt reports low back pain. Pt denies dizziness prior to the fall.

## 2017-07-11 NOTE — ED Notes (Signed)
Reviewed d/c instructions, follow-up care, prescription with patient. Pt verbalized understanding.  

## 2017-07-11 NOTE — Discharge Instructions (Signed)
Please use a heating pad along the lower lumbar spine for pain relief. Take oxycodone as prescribed for pain. Did not take any Norco/hydrocodone while taking the oxycodone. Please call your orthopedist or Dr. Harlow Mares office tomorrow to schedule follow-up appointment. Return to the ER for any worsening symptoms urgent changes in her health. Please discontinue using her cane and start using a walker to help with ambulation.

## 2017-07-11 NOTE — ED Notes (Signed)
See triage note  States she bent down to pick something up and fell backwards when she tried to get up  Hit her back on floor having lower back pain  Denies any dizziness and states she did not hit her head

## 2017-07-12 DIAGNOSIS — M545 Low back pain: Secondary | ICD-10-CM | POA: Diagnosis not present

## 2017-07-12 DIAGNOSIS — S32038A Other fracture of third lumbar vertebra, initial encounter for closed fracture: Secondary | ICD-10-CM | POA: Diagnosis not present

## 2017-07-17 DIAGNOSIS — M545 Low back pain: Secondary | ICD-10-CM | POA: Diagnosis not present

## 2017-07-18 DIAGNOSIS — R29890 Loss of height: Secondary | ICD-10-CM | POA: Diagnosis not present

## 2017-07-18 DIAGNOSIS — S32039D Unspecified fracture of third lumbar vertebra, subsequent encounter for fracture with routine healing: Secondary | ICD-10-CM | POA: Diagnosis not present

## 2017-07-18 DIAGNOSIS — S32031A Stable burst fracture of third lumbar vertebra, initial encounter for closed fracture: Secondary | ICD-10-CM | POA: Diagnosis not present

## 2017-07-18 DIAGNOSIS — Z1889 Other specified retained foreign body fragments: Secondary | ICD-10-CM | POA: Diagnosis not present

## 2017-07-18 DIAGNOSIS — Z7984 Long term (current) use of oral hypoglycemic drugs: Secondary | ICD-10-CM | POA: Diagnosis not present

## 2017-07-18 DIAGNOSIS — S82842D Displaced bimalleolar fracture of left lower leg, subsequent encounter for closed fracture with routine healing: Secondary | ICD-10-CM | POA: Diagnosis not present

## 2017-07-18 DIAGNOSIS — E119 Type 2 diabetes mellitus without complications: Secondary | ICD-10-CM | POA: Diagnosis not present

## 2017-07-18 DIAGNOSIS — R262 Difficulty in walking, not elsewhere classified: Secondary | ICD-10-CM | POA: Diagnosis not present

## 2017-07-18 DIAGNOSIS — S32038D Other fracture of third lumbar vertebra, subsequent encounter for fracture with routine healing: Secondary | ICD-10-CM | POA: Diagnosis not present

## 2017-07-23 ENCOUNTER — Encounter: Payer: Self-pay | Admitting: Family Medicine

## 2017-07-23 ENCOUNTER — Ambulatory Visit (INDEPENDENT_AMBULATORY_CARE_PROVIDER_SITE_OTHER): Payer: Medicare HMO | Admitting: Family Medicine

## 2017-07-23 VITALS — BP 133/82 | HR 85 | Temp 98.8°F

## 2017-07-23 DIAGNOSIS — N182 Chronic kidney disease, stage 2 (mild): Secondary | ICD-10-CM

## 2017-07-23 DIAGNOSIS — B372 Candidiasis of skin and nail: Secondary | ICD-10-CM | POA: Diagnosis not present

## 2017-07-23 DIAGNOSIS — F411 Generalized anxiety disorder: Secondary | ICD-10-CM

## 2017-07-23 DIAGNOSIS — F332 Major depressive disorder, recurrent severe without psychotic features: Secondary | ICD-10-CM

## 2017-07-23 DIAGNOSIS — S32050A Wedge compression fracture of fifth lumbar vertebra, initial encounter for closed fracture: Secondary | ICD-10-CM | POA: Diagnosis not present

## 2017-07-23 DIAGNOSIS — S32040A Wedge compression fracture of fourth lumbar vertebra, initial encounter for closed fracture: Secondary | ICD-10-CM | POA: Diagnosis not present

## 2017-07-23 DIAGNOSIS — E1122 Type 2 diabetes mellitus with diabetic chronic kidney disease: Secondary | ICD-10-CM | POA: Diagnosis not present

## 2017-07-23 DIAGNOSIS — S82843A Displaced bimalleolar fracture of unspecified lower leg, initial encounter for closed fracture: Secondary | ICD-10-CM | POA: Insufficient documentation

## 2017-07-23 DIAGNOSIS — S82842D Displaced bimalleolar fracture of left lower leg, subsequent encounter for closed fracture with routine healing: Secondary | ICD-10-CM | POA: Diagnosis not present

## 2017-07-23 DIAGNOSIS — S32031A Stable burst fracture of third lumbar vertebra, initial encounter for closed fracture: Secondary | ICD-10-CM | POA: Diagnosis not present

## 2017-07-23 MED ORDER — NYSTATIN 100000 UNIT/GM EX OINT: 1.0000 "application " | TOPICAL_OINTMENT | Freq: Two times a day (BID) | CUTANEOUS | 0 refills | Status: DC

## 2017-07-23 MED ORDER — NYSTATIN 100000 UNIT/GM EX POWD: Freq: Four times a day (QID) | CUTANEOUS | 0 refills | Status: DC

## 2017-07-23 MED ORDER — METFORMIN HCL 500 MG PO TABS: 500.0000 mg | ORAL_TABLET | Freq: Every day | ORAL | 1 refills | Status: DC

## 2017-07-23 NOTE — Assessment & Plan Note (Signed)
Acting up again. Rx sent to her pharmacy

## 2017-07-23 NOTE — Progress Notes (Signed)
BP 133/82   Pulse 85   Temp 98.8 F (37.1 C)   SpO2 96%    Subjective:    Patient ID: Dawn Moore, female    DOB: October 22, 1941, 76 y.o.   MRN: [id number]  HPI: Dawn Moore is a 76 y.o. female  Chief Complaint  Patient presents with  . Diabetes  . Anxiety   Dawn Moore has not been doing well since she was here last time. She fell about a month after last visit and sustained a bilmaleolar ankle fracture. She has been following with orthopedics, but those notes are not available today for review. She had been using a knee scooter and trying to get around. About 2 weeks ago, she tried to pick something up from the floor, lost her balance and fell on her bottom. This resulted in an acute L4 and L5 compression fracture. MRI revealed a L3 burst fracture. DEXA last year showed osteopenia, but no osteoporosis. She has been in a lot of pain. She was on oxycodone and was changed to hydrocodone. She is in a lot of pain. She is seeing ortho again on Monday.   DIABETES Hypoglycemic episodes:no Polydipsia/polyuria: no Visual disturbance: no Chest pain: no Paresthesias: no Glucose Monitoring: no Taking Insulin?: no Blood Pressure Monitoring: not checking Retinal Examination: Up to Date Foot Exam: Up to Date Diabetic Education: Completed Pneumovax: Up to Date Influenza: Up to Date Aspirin: yes  ANXIETY/DEPRESSION- worse recently because she has been unable to care for herself and has been in a lot of pain. Duration:exacerbated Anxious mood: yes  Excessive worrying: yes Irritability: yes  Sweating: yes Nausea: no Palpitations:no Hyperventilation: no Panic attacks: yes Agoraphobia: yes  Obscessions/compulsions: no Depressed mood: yes Depression screen [facility] 2/9 07/23/2017 04/18/2017 07/24/2016 07/02/2016 06/15/2016  Decreased Interest 3 3 1 2 3   Down, Depressed, Hopeless 3 3 1 2 3   PHQ - 2 Score 6 6 2 4 6   Altered sleeping 3 3 - 3 -  Tired, decreased energy 3 3 - 2 -  Change in appetite 3 3  - 0 -  Feeling bad or failure about yourself  2 0 - 2 -  Trouble concentrating 3 3 - 2 -  Moving slowly or fidgety/restless 3 0 - 0 -  Suicidal thoughts 2 0 - 0 -  PHQ-9 Score 25 18 - 13 -  Difficult doing work/chores - - - Very difficult -   GAD 7 : Generalized Anxiety Score 07/23/2017 02/27/2016  Nervous, Anxious, on Edge 3 3  Control/stop worrying 3 3  Worry too much - different things 3 3  Trouble relaxing 3 1  Restless 3 1  Easily annoyed or irritable 3 3  Afraid - awful might happen 2 2  Total GAD 7 Score 20 16  Anxiety Difficulty Extremely difficult Somewhat difficult   Anhedonia: yes Weight changes: no Insomnia: yes   Hypersomnia: yes Fatigue/loss of energy: yes Feelings of worthlessness: yes Feelings of guilt: yes Impaired concentration/indecisiveness: yes Suicidal ideations: no  Crying spells: yes Recent Stressors/Life Changes: yes  Relevant past medical, surgical, family and social history reviewed and updated as indicated. Interim medical history since our last visit reviewed. Allergies and medications reviewed and updated.  Review of Systems  Per HPI unless specifically indicated above     Objective:    BP 133/82   Pulse 85   Temp 98.8 F (37.1 C)   SpO2 96%   Wt Readings from Last 3 Encounters:  07/11/17 210 lb (95.3  kg)  05/29/17 225 lb (102.1 kg)  04/18/17 230 lb 6.4 oz (104.5 kg)    Physical Exam  Constitutional: She is oriented to person, place, and time. She appears well-developed and well-nourished. No distress.  In L walking boot and back brace, uncomfortable looking  HENT:  Head: Normocephalic and atraumatic.  Right Ear: Hearing normal.  Left Ear: Hearing normal.  Nose: Nose normal.  Eyes: Conjunctivae and lids are normal. Right eye exhibits no discharge. Left eye exhibits no discharge. No scleral icterus.  Cardiovascular: Normal rate, regular rhythm, normal heart sounds and intact distal pulses.  Exam reveals no gallop and no friction  rub.   No murmur heard. Pulmonary/Chest: Effort normal and breath sounds normal. No respiratory distress. She has no wheezes. She has no rales. She exhibits no tenderness.  Musculoskeletal: Normal range of motion.  Neurological: She is alert and oriented to person, place, and time.  Skin: Skin is warm, dry and intact. Rash (in groin- yeasty looking) noted. She is not diaphoretic. No erythema. No pallor.  Psychiatric: She has a normal mood and affect. Her speech is normal and behavior is normal. Judgment and thought content normal. Cognition and memory are normal.  Nursing note and vitals reviewed.   Results for orders placed or performed in visit on 05/14/17  [id number] 11+Oxyco+Alc+Crt-Bund  Result Value Ref Range   Ethanol Negative Cutoff=0.020 %   Amphetamines, Urine Negative Cutoff=1000 ng/mL   Barbiturate Negative Cutoff=200 ng/mL   BENZODIAZ UR QL See Final Results Cutoff=200 ng/mL   Cannabinoid Quant, Ur Negative Cutoff=50 ng/mL   Cocaine (Metabolite) Negative Cutoff=300 ng/mL   OPIATE SCREEN URINE Negative Cutoff=300 ng/mL   OXYCODONE+OXYMORPHONE UR QL SCN Negative Cutoff=300 ng/mL   Phencyclidine Negative Cutoff=25 ng/mL   Methadone Screen, Urine Negative Cutoff=300 ng/mL   Propoxyphene Negative Cutoff=300 ng/mL   Meperidine Negative Cutoff=200 ng/mL   Tramadol Negative Cutoff=200 ng/mL   Creatinine 281.0 20.0 - 300.0 mg/dL   PH OF URINE 7.0 4.5 - 8.9  Benzodiazepines Confirm, Urine  Result Value Ref Range   Benzodiazepines Positive (A) Cutoff=100 ng/mL   Nordiazepam Negative Cutoff=100   Oxazepam Negative Cutoff=100   Flurazepam Negative Cutoff=100   Lorazepam Negative Cutoff=100   Alprazolam Positive (A)    Alprazolam Conf. 377 Cutoff=100 ng/mL   Clonazepam Negative Cutoff=100   Temazepam Negative Cutoff=100   Triazolam Negative Cutoff=100   Midazolam Negative Cutoff=100      Assessment & Plan:   Problem List Items Addressed This Visit      Endocrine   Diabetes  mellitus with renal manifestations, controlled ([place]) - Primary    Continues to improve. A1c 5.8- will cut metformin down to 1x a day and recheck in 3 months. Call with any concerns.       Relevant Medications   metFORMIN (GLUCOPHAGE) 500 MG tablet   Other Relevant Orders   Bayer DCA Hb A1c Waived     Musculoskeletal and Integument   Candidal intertrigo    Acting up again. Rx sent to her pharmacy      Relevant Medications   nystatin ointment (MYCOSTATIN)   nystatin (MYCOSTATIN/NYSTOP) powder   Ankle fracture, bimalleolar, closed    Continue to follow with orthopedics. Continue to monitor. Call with any concerns.         Other   Anxiety disorder    Exacerbated with injury. Continue current regimen. Continue to monitor. Recheck 3 months when things calm down.       Depression    Exacerbated with  injury. Continue current regimen. Continue to monitor. Recheck 3 months when things calm down.        Other Visit Diagnoses    Closed compression fracture of fourth lumbar vertebra, initial encounter ([place])       Continue to follow with orthopedics. Continue to monitor. Call with any concerns. Advised lidocaine patches as needed.    Closed compression fracture of fifth lumbar vertebra, initial encounter ([place])       Continue to follow with orthopedics. Continue to monitor. Call with any concerns. Advised lidocaine patches as needed.    Closed stable burst fracture of third lumbar vertebra, initial encounter ([place])       Continue to follow with orthopedics. Continue to monitor. Call with any concerns. Advised lidocaine patches as needed.        Follow up plan: Return in about 3 months (around 10/23/2017) for DM/Chol/BP/Anxiety follow up.

## 2017-07-23 NOTE — Assessment & Plan Note (Signed)
Continues to improve. A1c 5.8- will cut metformin down to 1x a day and recheck in 3 months. Call with any concerns.

## 2017-07-23 NOTE — Assessment & Plan Note (Signed)
Exacerbated with injury. Continue current regimen. Continue to monitor. Recheck 3 months when things calm down.

## 2017-07-23 NOTE — Assessment & Plan Note (Signed)
Continue to follow with orthopedics. Continue to monitor. Call with any concerns.

## 2017-07-25 DIAGNOSIS — E119 Type 2 diabetes mellitus without complications: Secondary | ICD-10-CM | POA: Diagnosis not present

## 2017-07-25 DIAGNOSIS — Z7984 Long term (current) use of oral hypoglycemic drugs: Secondary | ICD-10-CM | POA: Diagnosis not present

## 2017-07-25 DIAGNOSIS — R262 Difficulty in walking, not elsewhere classified: Secondary | ICD-10-CM | POA: Diagnosis not present

## 2017-07-25 DIAGNOSIS — S82842D Displaced bimalleolar fracture of left lower leg, subsequent encounter for closed fracture with routine healing: Secondary | ICD-10-CM | POA: Diagnosis not present

## 2017-07-25 DIAGNOSIS — S32039D Unspecified fracture of third lumbar vertebra, subsequent encounter for fracture with routine healing: Secondary | ICD-10-CM | POA: Diagnosis not present

## 2017-07-25 LAB — BAYER DCA HB A1C WAIVED: HB A1C (BAYER DCA - WAIVED): 5.8 % (ref <7.0)

## 2017-07-26 DIAGNOSIS — S32039D Unspecified fracture of third lumbar vertebra, subsequent encounter for fracture with routine healing: Secondary | ICD-10-CM | POA: Diagnosis not present

## 2017-07-26 DIAGNOSIS — Z7984 Long term (current) use of oral hypoglycemic drugs: Secondary | ICD-10-CM | POA: Diagnosis not present

## 2017-07-26 DIAGNOSIS — S82842D Displaced bimalleolar fracture of left lower leg, subsequent encounter for closed fracture with routine healing: Secondary | ICD-10-CM | POA: Diagnosis not present

## 2017-07-26 DIAGNOSIS — R262 Difficulty in walking, not elsewhere classified: Secondary | ICD-10-CM | POA: Diagnosis not present

## 2017-07-26 DIAGNOSIS — E119 Type 2 diabetes mellitus without complications: Secondary | ICD-10-CM | POA: Diagnosis not present

## 2017-07-27 DIAGNOSIS — S32039D Unspecified fracture of third lumbar vertebra, subsequent encounter for fracture with routine healing: Secondary | ICD-10-CM | POA: Diagnosis not present

## 2017-07-27 DIAGNOSIS — E119 Type 2 diabetes mellitus without complications: Secondary | ICD-10-CM | POA: Diagnosis not present

## 2017-07-27 DIAGNOSIS — R262 Difficulty in walking, not elsewhere classified: Secondary | ICD-10-CM | POA: Diagnosis not present

## 2017-07-27 DIAGNOSIS — Z7984 Long term (current) use of oral hypoglycemic drugs: Secondary | ICD-10-CM | POA: Diagnosis not present

## 2017-07-27 DIAGNOSIS — S82842D Displaced bimalleolar fracture of left lower leg, subsequent encounter for closed fracture with routine healing: Secondary | ICD-10-CM | POA: Diagnosis not present

## 2017-07-28 ENCOUNTER — Emergency Department: Payer: Medicare HMO

## 2017-07-28 ENCOUNTER — Emergency Department
Admission: EM | Admit: 2017-07-28 | Discharge: 2017-07-28 | Disposition: A | Discharge status: Home / Self Care | Payer: Medicare HMO | Attending: Emergency Medicine | Admitting: Emergency Medicine

## 2017-07-28 ENCOUNTER — Encounter: Payer: Self-pay | Admitting: Emergency Medicine

## 2017-07-28 DIAGNOSIS — J45909 Unspecified asthma, uncomplicated: Secondary | ICD-10-CM | POA: Diagnosis not present

## 2017-07-28 DIAGNOSIS — E1140 Type 2 diabetes mellitus with diabetic neuropathy, unspecified: Secondary | ICD-10-CM | POA: Diagnosis not present

## 2017-07-28 DIAGNOSIS — R296 Repeated falls: Secondary | ICD-10-CM | POA: Diagnosis not present

## 2017-07-28 DIAGNOSIS — R42 Dizziness and giddiness: Secondary | ICD-10-CM | POA: Diagnosis not present

## 2017-07-28 DIAGNOSIS — S82022A Displaced longitudinal fracture of left patella, initial encounter for closed fracture: Secondary | ICD-10-CM | POA: Diagnosis not present

## 2017-07-28 DIAGNOSIS — Z85828 Personal history of other malignant neoplasm of skin: Secondary | ICD-10-CM | POA: Insufficient documentation

## 2017-07-28 DIAGNOSIS — I10 Essential (primary) hypertension: Secondary | ICD-10-CM | POA: Insufficient documentation

## 2017-07-28 LAB — BASIC METABOLIC PANEL
Anion gap: 7 (ref 5–15)
BUN: 11 mg/dL (ref 6–20)
CALCIUM: 9.1 mg/dL (ref 8.9–10.3)
CO2: 26 mmol/L (ref 22–32)
CREATININE: 0.92 mg/dL (ref 0.44–1.00)
Chloride: 105 mmol/L (ref 101–111)
GFR calc Af Amer: >60 mL/min (ref >60)
GFR calc non Af Amer: 60 mL/min — ABNORMAL LOW (ref >60)
GLUCOSE: 152 mg/dL — AB (ref 65–99)
Potassium: 4 mmol/L (ref 3.5–5.1)
Sodium: 138 mmol/L (ref 135–145)

## 2017-07-28 LAB — CBC
HCT: 41.1 % (ref 35.0–47.0)
Hemoglobin: 13.3 g/dL (ref 12.0–16.0)
MCH: 26.8 pg (ref 26.0–34.0)
MCHC: 32.4 g/dL (ref 32.0–36.0)
MCV: 82.5 fL (ref 80.0–100.0)
PLATELETS: 166 10*3/uL (ref 150–440)
RBC: 4.98 MIL/uL (ref 3.80–5.20)
RDW: 17.6 % — AB (ref 11.5–14.5)
WBC: 6 10*3/uL (ref 3.6–11.0)

## 2017-07-28 LAB — TROPONIN I: Troponin I: <0.03 ng/mL (ref <0.03)

## 2017-07-28 MED ORDER — SODIUM CHLORIDE 0.9 % IV SOLN
Freq: Once | INTRAVENOUS | Status: AC
  Administered 2017-07-28: 21:00:00 via INTRAVENOUS

## 2017-07-28 MED ORDER — DIAZEPAM 5 MG PO TABS
5.0000 mg | ORAL_TABLET | Freq: Once | ORAL | Status: AC
  Administered 2017-07-28: 5 mg via ORAL
  Filled 2017-07-28: qty 1

## 2017-07-28 MED ORDER — DIAZEPAM 5 MG PO TABS: 5.0000 mg | ORAL_TABLET | Freq: Three times a day (TID) | ORAL | 0 refills | Status: DC | PRN

## 2017-07-28 MED ORDER — MECLIZINE HCL 25 MG PO TABS: 25.0000 mg | ORAL_TABLET | Freq: Three times a day (TID) | ORAL | 1 refills | Status: DC | PRN

## 2017-07-28 MED ORDER — MECLIZINE HCL 25 MG PO TABS
50.0000 mg | ORAL_TABLET | Freq: Once | ORAL | Status: AC
  Administered 2017-07-28: 50 mg via ORAL
  Filled 2017-07-28: qty 2

## 2017-07-28 NOTE — ED Provider Notes (Addendum)
[facility] Emergency Department Provider Note       Time seen: ----------------------------------------- 7:58 PM on 07/28/2017 -----------------------------------------     I have reviewed the triage vital signs and the nursing notes.   HISTORY   Chief Complaint Dizziness    HPI Dawn Moore is a 76 y.o. female who presents to the ED for dizziness. Patient states she feels weak all over and is felt like this for last 48 hours. She does have generalized pain and has broken her left leg and wears a brace after 3 vertebral fractures from falls. She fell again on Friday and Saturday because of dizziness. By dizziness she is describing the room spinning, she denies numbness, tingling or focal weakness.   Past Medical History:  Diagnosis Date  . Anxiety   . Arthritis    OA knees, hands and feet  . Depression   . Diabetes mellitus without complication ([place])   . GERD (gastroesophageal reflux disease)   . Headache   . Osteopenia   . Shortness of breath dyspnea   . Sleep apnea    doesn't use CPAP  . 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 Ankle fracture, bimalleolar, closed 07/23/2017  . Advanced care planning/counseling discussion 04/18/2017  . HTN (hypertension) 12/14/2016  . Transient alteration of awareness 05/01/2016  . Controlled substance agreement signed 11/29/2015  . Thrombocytopenia ([place]) 07/21/2015  . Depression 07/21/2015  . Osteopenia 07/21/2015  . Diabetes mellitus with renal manifestations, controlled ([place]) 07/21/2015  . Generalized osteoarthritis 07/21/2015  . Hyperlipidemia 07/21/2015  . Asthma 07/21/2015  . Diabetic neuropathy ([place]) 07/21/2015  . GERD (gastroesophageal reflux disease) 07/21/2015  . Nonspecific elevation of levels of transaminase or lactic acid dehydrogenase (LDH) 07/21/2015  . 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 Anxiety disorder 07/07/2015  . Candidal intertrigo 07/07/2015    Past Surgical History:  Procedure Laterality Date  . ABDOMINAL HYSTERECTOMY  1986   still has one ovary  . CATARACT EXTRACTION W/PHACO Left 03/07/2016   Procedure: CATARACT EXTRACTION PHACO AND INTRAOCULAR LENS PLACEMENT (IOC);  Surgeon: Leandrew Koyanagi, MD;  Location: [place];  Service: Ophthalmology;  Laterality: Left;  DIABETIC -oral meds Sleep apnea  . CATARACT EXTRACTION W/PHACO Right 03/28/2016   Procedure: CATARACT EXTRACTION PHACO AND INTRAOCULAR LENS PLACEMENT (IOC);  Surgeon: Leandrew Koyanagi, MD;  Location: [place];  Service: Ophthalmology;  Laterality: Right;  DIABETIC - oral meds  . CHOLECYSTECTOMY  1999  . COCHLEAR IMPLANT    . FOOT FRACTURE SURGERY    . HEEL SPUR SURGERY Left 11/04/09   Revision 01/2011  . INTRAOCULAR LENS INSERTION Bilateral     Allergies Nsaids and Wellbutrin [bupropion]  Social History Social History  Substance Use Topics  . Smoking status: Never Smoker  . Smokeless tobacco: Never Used  . Alcohol use No    Review of Systems Constitutional: Negative for fever. Eyes: Negative for vision changes ENT:  Negative for congestion, sore throat Cardiovascular: Negative for chest pain. Respiratory: Negative for shortness of breath. Gastrointestinal: Negative for abdominal pain, vomiting and diarrhea. Genitourinary: Negative for dysuria. Musculoskeletal: Positive for generalized pain Skin: Negative for rash. Neurological: Negative for headaches, Positive for weakness  All systems negative/normal/unremarkable except as stated in the HPI  ____________________________________________   PHYSICAL EXAM:  VITAL SIGNS: ED Triage Vitals  Enc Vitals Group     BP 07/28/17  1728 112/78     Pulse Rate 07/28/17 1728 90     Resp 07/28/17 1728 18     Temp 07/28/17 1728 98.3 F (36.8 C)     Temp Source 07/28/17 1728 Oral     SpO2 07/28/17 1728 94  %     Weight 07/28/17 1728 210 lb (95.3 kg)     Height 07/28/17 1728 5\' 6"  (1.676 m)     Head Circumference --      Peak Flow --      Pain Score 07/28/17 1727 10     Pain Loc --      Pain Edu? --      Excl. in [place]? --     Constitutional: Alert and oriented. Well appearing and in no distress. Eyes: Conjunctivae are normal. Normal extraocular movements. ENT   Head: Normocephalic and atraumatic.   Nose: No congestion/rhinnorhea.   Mouth/Throat: Mucous membranes are moist.   Neck: No stridor. Cardiovascular: Normal rate, regular rhythm. No murmurs, rubs, or gallops. Respiratory: Normal respiratory effort without tachypnea nor retractions. Breath sounds are clear and equal bilaterally. No wheezes/rales/rhonchi. Gastrointestinal: Soft and nontender. Normal bowel sounds Musculoskeletal: Nontender with normal range of motion in extremities. Patient is wearing a walking boot on the left foot and wearing a TLSO Neurologic:  Normal speech and language. No gross focal neurologic deficits are appreciated. Strength, sensation, cranial nerves, finger to nose testing are normal  Skin:  Skin is warm, dry and intact. Small abrasion noted over the left patella Psychiatric: Mood and affect are normal. Speech and behavior are normal.  ____________________________________________  EKG: Interpreted by me. Sinus rhythm rate 84 bpm, normal PR interval, Normal QRS, normal QT. Normal axis.  ____________________________________________  ED COURSE:  Pertinent labs & imaging results that were available during my care of the patient were reviewed by me and considered in my medical decision making (see chart for details). Patient presents for vertigo and frequent falls, we will assess with labs and imaging as indicated. Clinical Course as of Jul 28 2214  Sun Jul 28, 2017  2201 Patient's vertigo symptoms have resolved after medications and fluids. She was able to safely ambulate. She'll be referred to  ENT for outpatient follow-up.  [JW]    Clinical Course User Index [JW] Earleen Newport, MD   Procedures ____________________________________________   LABS (pertinent positives/negatives)  Labs Reviewed  BASIC METABOLIC PANEL - Abnormal; Notable for the following:       Result Value   Glucose, Bld 152 (*)    GFR calc non Af Amer 60 (*)    All other components within normal limits  CBC - Abnormal; Notable for the following:    RDW 17.6 (*)    All other components within normal limits  TROPONIN I  URINALYSIS, COMPLETE (UACMP) WITH MICROSCOPIC  CBG MONITORING, ED    RADIOLOGY Images were viewed by me  CT head IMPRESSION: No acute intracranial abnormality.  Moderate brain parenchymal atrophy and chronic microvascular disease.  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 ____________________________________________  FINAL ASSESSMENT AND PLAN  Vertigo, frequent falls  Plan: Patient's labs and imaging were dictated above. Patient had presented for Frequent falls and symptoms of vertigo here. She is in no distress and can safely Ambulate now after meclizine and Valium. She'll be discharged with similar and referred to ENT for close outpatient follow-up.   Earleen Newport,  MD   Note: This note was generated in part or whole with voice recognition software. Voice recognition is usually quite accurate but there are transcription errors that can and very often do occur. I apologize for any typographical errors that were not detected and corrected.     Earleen Newport, MD 07/28/17 2215    Earleen Newport, MD 07/28/17 2215

## 2017-07-28 NOTE — ED Triage Notes (Signed)
Pt here for dizziness.  Feels weak all over. Has felt like this for 2 days. Has generalized pain.  Has broken left leg and brace for 3 vertebral fractures from falls.  Fell again Friday and Saturday because of dizziness.  No unilateral weakness. Speech clear. No facial droop.

## 2017-07-28 NOTE — ED Notes (Signed)
Pt has been falling and dizzy for the last few days. Is on vicodin and only took one today d/t the dizziness so her pain is 10/10. Pt states this is her same pain from the original fall but may be slightly worse since falling again recently.

## 2017-07-29 DIAGNOSIS — M545 Low back pain: Secondary | ICD-10-CM | POA: Diagnosis not present

## 2017-07-29 DIAGNOSIS — S82842D Displaced bimalleolar fracture of left lower leg, subsequent encounter for closed fracture with routine healing: Secondary | ICD-10-CM | POA: Diagnosis not present

## 2017-08-01 ENCOUNTER — Other Ambulatory Visit: Payer: Self-pay | Admitting: Family Medicine

## 2017-08-01 MED ORDER — ALPRAZOLAM 1 MG PO TABS: 1.0000 mg | ORAL_TABLET | Freq: Three times a day (TID) | ORAL | 0 refills | Status: DC | PRN

## 2017-08-05 DIAGNOSIS — M545 Low back pain: Secondary | ICD-10-CM | POA: Diagnosis not present

## 2017-08-07 DIAGNOSIS — R262 Difficulty in walking, not elsewhere classified: Secondary | ICD-10-CM | POA: Diagnosis not present

## 2017-08-07 DIAGNOSIS — E119 Type 2 diabetes mellitus without complications: Secondary | ICD-10-CM | POA: Diagnosis not present

## 2017-08-07 DIAGNOSIS — S82842D Displaced bimalleolar fracture of left lower leg, subsequent encounter for closed fracture with routine healing: Secondary | ICD-10-CM | POA: Diagnosis not present

## 2017-08-07 DIAGNOSIS — Z7984 Long term (current) use of oral hypoglycemic drugs: Secondary | ICD-10-CM | POA: Diagnosis not present

## 2017-08-07 DIAGNOSIS — S32039D Unspecified fracture of third lumbar vertebra, subsequent encounter for fracture with routine healing: Secondary | ICD-10-CM | POA: Diagnosis not present

## 2017-08-09 DIAGNOSIS — E119 Type 2 diabetes mellitus without complications: Secondary | ICD-10-CM | POA: Diagnosis not present

## 2017-08-09 DIAGNOSIS — S82842D Displaced bimalleolar fracture of left lower leg, subsequent encounter for closed fracture with routine healing: Secondary | ICD-10-CM | POA: Diagnosis not present

## 2017-08-09 DIAGNOSIS — S32039D Unspecified fracture of third lumbar vertebra, subsequent encounter for fracture with routine healing: Secondary | ICD-10-CM | POA: Diagnosis not present

## 2017-08-09 DIAGNOSIS — Z7984 Long term (current) use of oral hypoglycemic drugs: Secondary | ICD-10-CM | POA: Diagnosis not present

## 2017-08-09 DIAGNOSIS — R262 Difficulty in walking, not elsewhere classified: Secondary | ICD-10-CM | POA: Diagnosis not present

## 2017-08-13 DIAGNOSIS — R262 Difficulty in walking, not elsewhere classified: Secondary | ICD-10-CM | POA: Diagnosis not present

## 2017-08-13 DIAGNOSIS — E119 Type 2 diabetes mellitus without complications: Secondary | ICD-10-CM | POA: Diagnosis not present

## 2017-08-13 DIAGNOSIS — Z7984 Long term (current) use of oral hypoglycemic drugs: Secondary | ICD-10-CM | POA: Diagnosis not present

## 2017-08-13 DIAGNOSIS — S82842D Displaced bimalleolar fracture of left lower leg, subsequent encounter for closed fracture with routine healing: Secondary | ICD-10-CM | POA: Diagnosis not present

## 2017-08-13 DIAGNOSIS — S32039D Unspecified fracture of third lumbar vertebra, subsequent encounter for fracture with routine healing: Secondary | ICD-10-CM | POA: Diagnosis not present

## 2017-08-14 ENCOUNTER — Encounter: Payer: Self-pay | Admitting: Family Medicine

## 2017-08-14 ENCOUNTER — Ambulatory Visit (INDEPENDENT_AMBULATORY_CARE_PROVIDER_SITE_OTHER): Payer: Medicare HMO | Admitting: Family Medicine

## 2017-08-14 VITALS — BP 126/80 | HR 92 | Temp 98.6°F

## 2017-08-14 DIAGNOSIS — H8113 Benign paroxysmal vertigo, bilateral: Secondary | ICD-10-CM

## 2017-08-14 MED ORDER — MECLIZINE HCL 25 MG PO TABS
25.0000 mg | ORAL_TABLET | Freq: Three times a day (TID) | ORAL | 1 refills | Status: DC | PRN
Start: 2017-08-14 — End: 2017-08-20

## 2017-08-14 NOTE — Progress Notes (Signed)
BP 126/80   Pulse 92   Temp 98.6 F (37 C)   SpO2 95%    Subjective:    Patient ID: Dawn Moore, female    DOB: [birth year], 76 y.o.   MRN: [id number]  HPI: Dawn Moore is a 76 y.o. female  Chief Complaint  Patient presents with  . ER follow up  . Medication Refill    Meclizine   ER FOLLOW UP Time since discharge: 18 days Hospital/facility: [facility] Diagnosis: Vertigo Procedures/tests: CT head- normal, labs normal Consultants: none New medications: meclizine  Discharge instructions:  Follow up with ENT Status: fluctuating  DIZZINESS Duration: couple of weeks Description of symptoms: room spinning Duration of episode: seconds Dizziness frequency: recurrent- every day, couple of times a day Provoking factors: sitting to standing Aggravating factors:  Sitting to standing Triggered by rolling over in bed: no Triggered by bending over: yes Aggravated by head movement: no Aggravated by exertion, coughing, loud noises: no Recent head injury: no Recent or current viral symptoms: no History of vasovagal episodes: no Nausea: yes Vomiting: yes- 1x Tinnitus: no Hearing loss: yes Aural fullness: yes Headache: no Photophobia/phonophobia: no Unsteady gait: yes Postural instability: yes Diplopia, dysarthria, dysphagia or weakness: no Related to exertion: no Pallor: no Diaphoresis: no Dyspnea: no Chest pain: no  Relevant past medical, surgical, family and social history reviewed and updated as indicated. Interim medical history since our last visit reviewed. Allergies and medications reviewed and updated.  Review of Systems  Constitutional: Negative.   Respiratory: Negative.   Cardiovascular: Negative.   Neurological: Positive for dizziness and light-headedness. Negative for tremors, seizures, syncope, facial asymmetry, speech difficulty, weakness, numbness and headaches.  Psychiatric/Behavioral: Negative.     Per HPI unless specifically indicated above       Objective:    BP 126/80   Pulse 92   Temp 98.6 F (37 C)   SpO2 95%   Wt Readings from Last 3 Encounters:  07/28/17 210 lb (95.3 kg)  07/11/17 210 lb (95.3 kg)  05/29/17 225 lb (102.1 kg)    Orthostatic VS for the past 24 hrs:  BP- Lying Pulse- Lying BP- Sitting Pulse- Sitting BP- Standing at 0 minutes Pulse- Standing at 0 minutes  08/14/17 1100 125/84 98 119/79 96 119/78 96   Physical Exam  Constitutional: She is oriented to person, place, and time. She appears well-developed and well-nourished. No distress.  HENT:  Head: Normocephalic and atraumatic.  Right Ear: Hearing and external ear normal.  Left Ear: Hearing and external ear normal.  Nose: Nose normal.  Mouth/Throat: Oropharynx is clear and moist. No oropharyngeal exudate.  Eyes: Pupils are equal, round, and reactive to light. Conjunctivae and lids are normal. Right eye exhibits no discharge. Left eye exhibits no discharge. No scleral icterus. Right eye exhibits nystagmus. Right eye exhibits normal extraocular motion. Left eye exhibits nystagmus. Left eye exhibits normal extraocular motion.  Cardiovascular: Normal rate, regular rhythm, normal heart sounds and intact distal pulses.  Exam reveals no gallop and no friction rub.   No murmur heard. Pulmonary/Chest: Effort normal and breath sounds normal. No respiratory distress. She has no wheezes. She has no rales. She exhibits no tenderness.  Musculoskeletal: Normal range of motion.  Neurological: She is alert and oriented to person, place, and time.  Skin: Skin is intact. No rash noted. She is not diaphoretic.  Psychiatric: She has a normal mood and affect. Her speech is normal and behavior is normal. Judgment and thought content normal. Cognition  and memory are normal.  Nursing note and vitals reviewed.   Results for orders placed or performed during the hospital encounter of 09/47/09  Basic metabolic panel  Result Value Ref Range   Sodium 138 135 - 145 mmol/L    Potassium 4.0 3.5 - 5.1 mmol/L   Chloride 105 101 - 111 mmol/L   CO2 26 22 - 32 mmol/L   Glucose, Bld 152 (H) 65 - 99 mg/dL   BUN 11 6 - 20 mg/dL   Creatinine, Ser 0.92 0.44 - 1.00 mg/dL   Calcium 9.1 8.9 - 10.3 mg/dL   GFR calc non Af Amer 60 (L) >60 mL/min   GFR calc Af Amer >60 >60 mL/min   Anion gap 7 5 - 15  CBC  Result Value Ref Range   WBC 6.0 3.6 - 11.0 K/uL   RBC 4.98 3.80 - 5.20 MIL/uL   Hemoglobin 13.3 12.0 - 16.0 g/dL   HCT 41.1 35.0 - 47.0 %   MCV 82.5 80.0 - 100.0 fL   MCH 26.8 26.0 - 34.0 pg   MCHC 32.4 32.0 - 36.0 g/dL   RDW 17.6 (H) 11.5 - 14.5 %   Platelets 166 150 - 440 K/uL  Troponin I  Result Value Ref Range   Troponin I <0.03 <0.03 ng/mL      Assessment & Plan:   Problem List Items Addressed This Visit    None    Visit Diagnoses    Benign paroxysmal positional vertigo due to bilateral vestibular disorder    -  Primary   Referral for home PT placed, referral to ENT. Conitnue meclizine as needed. Call if not getting better or getting worse.    Relevant Orders   Ambulatory referral to ENT       Follow up plan: Return As scheduled.

## 2017-08-16 DIAGNOSIS — S32039D Unspecified fracture of third lumbar vertebra, subsequent encounter for fracture with routine healing: Secondary | ICD-10-CM | POA: Diagnosis not present

## 2017-08-16 DIAGNOSIS — E119 Type 2 diabetes mellitus without complications: Secondary | ICD-10-CM | POA: Diagnosis not present

## 2017-08-16 DIAGNOSIS — Z7984 Long term (current) use of oral hypoglycemic drugs: Secondary | ICD-10-CM | POA: Diagnosis not present

## 2017-08-16 DIAGNOSIS — R262 Difficulty in walking, not elsewhere classified: Secondary | ICD-10-CM | POA: Diagnosis not present

## 2017-08-16 DIAGNOSIS — S82842D Displaced bimalleolar fracture of left lower leg, subsequent encounter for closed fracture with routine healing: Secondary | ICD-10-CM | POA: Diagnosis not present

## 2017-08-19 ENCOUNTER — Telehealth: Payer: Self-pay | Admitting: Family Medicine

## 2017-08-19 DIAGNOSIS — Z7984 Long term (current) use of oral hypoglycemic drugs: Secondary | ICD-10-CM | POA: Diagnosis not present

## 2017-08-19 DIAGNOSIS — E119 Type 2 diabetes mellitus without complications: Secondary | ICD-10-CM | POA: Diagnosis not present

## 2017-08-19 DIAGNOSIS — S82842D Displaced bimalleolar fracture of left lower leg, subsequent encounter for closed fracture with routine healing: Secondary | ICD-10-CM | POA: Diagnosis not present

## 2017-08-19 DIAGNOSIS — R262 Difficulty in walking, not elsewhere classified: Secondary | ICD-10-CM | POA: Diagnosis not present

## 2017-08-19 DIAGNOSIS — S32039D Unspecified fracture of third lumbar vertebra, subsequent encounter for fracture with routine healing: Secondary | ICD-10-CM | POA: Diagnosis not present

## 2017-08-19 DIAGNOSIS — M25572 Pain in left ankle and joints of left foot: Secondary | ICD-10-CM | POA: Diagnosis not present

## 2017-08-19 NOTE — Telephone Encounter (Signed)
Pts daughter Dawn Moore called and stated that the pt would like to have meclizine sent to [facility] for a 90 day supply instead of 30 day supply do to cost difference.

## 2017-08-20 ENCOUNTER — Telehealth: Payer: Self-pay | Admitting: *Deleted

## 2017-08-20 MED ORDER — MECLIZINE HCL 25 MG PO TABS: 25.0000 mg | ORAL_TABLET | Freq: Three times a day (TID) | ORAL | 0 refills | Status: DC | PRN

## 2017-08-20 NOTE — Telephone Encounter (Signed)
Routing to provider  

## 2017-08-20 NOTE — Telephone Encounter (Signed)
-----   Message from Tula Nakayama sent at 08/19/2017  4:34 PM EDT ----- referral

## 2017-08-20 NOTE — Telephone Encounter (Signed)
Called pt to verify she knew about appt with [facility]. Spoke with pt and daughter at pts request. Also let her know her Meclizine had been sent in and was a 90 day supply.

## 2017-08-21 ENCOUNTER — Telehealth: Payer: Self-pay | Admitting: Family Medicine

## 2017-08-21 NOTE — Telephone Encounter (Signed)
Jana Half from [facility] stopped by the office to speak with Tiffany regarding the referral on this patient.  She can be reached @ [phone]  Thank you

## 2017-08-22 DIAGNOSIS — R42 Dizziness and giddiness: Secondary | ICD-10-CM | POA: Diagnosis not present

## 2017-08-22 DIAGNOSIS — R682 Dry mouth, unspecified: Secondary | ICD-10-CM | POA: Diagnosis not present

## 2017-08-23 ENCOUNTER — Other Ambulatory Visit: Payer: Self-pay | Admitting: Family Medicine

## 2017-08-23 DIAGNOSIS — S82842D Displaced bimalleolar fracture of left lower leg, subsequent encounter for closed fracture with routine healing: Secondary | ICD-10-CM | POA: Diagnosis not present

## 2017-08-23 DIAGNOSIS — H8110 Benign paroxysmal vertigo, unspecified ear: Secondary | ICD-10-CM

## 2017-08-23 DIAGNOSIS — E119 Type 2 diabetes mellitus without complications: Secondary | ICD-10-CM | POA: Diagnosis not present

## 2017-08-23 DIAGNOSIS — S32039D Unspecified fracture of third lumbar vertebra, subsequent encounter for fracture with routine healing: Secondary | ICD-10-CM | POA: Diagnosis not present

## 2017-08-23 DIAGNOSIS — Z7984 Long term (current) use of oral hypoglycemic drugs: Secondary | ICD-10-CM | POA: Diagnosis not present

## 2017-08-23 DIAGNOSIS — R262 Difficulty in walking, not elsewhere classified: Secondary | ICD-10-CM | POA: Diagnosis not present

## 2017-08-26 DIAGNOSIS — M545 Low back pain: Secondary | ICD-10-CM | POA: Diagnosis not present

## 2017-08-26 NOTE — Telephone Encounter (Signed)
Order placed

## 2017-08-28 DIAGNOSIS — E119 Type 2 diabetes mellitus without complications: Secondary | ICD-10-CM | POA: Diagnosis not present

## 2017-08-28 DIAGNOSIS — S32039D Unspecified fracture of third lumbar vertebra, subsequent encounter for fracture with routine healing: Secondary | ICD-10-CM | POA: Diagnosis not present

## 2017-08-28 DIAGNOSIS — S82842D Displaced bimalleolar fracture of left lower leg, subsequent encounter for closed fracture with routine healing: Secondary | ICD-10-CM | POA: Diagnosis not present

## 2017-08-28 DIAGNOSIS — R262 Difficulty in walking, not elsewhere classified: Secondary | ICD-10-CM | POA: Diagnosis not present

## 2017-08-28 DIAGNOSIS — Z7984 Long term (current) use of oral hypoglycemic drugs: Secondary | ICD-10-CM | POA: Diagnosis not present

## 2017-08-29 DIAGNOSIS — S32039D Unspecified fracture of third lumbar vertebra, subsequent encounter for fracture with routine healing: Secondary | ICD-10-CM | POA: Diagnosis not present

## 2017-08-29 DIAGNOSIS — Z7984 Long term (current) use of oral hypoglycemic drugs: Secondary | ICD-10-CM | POA: Diagnosis not present

## 2017-08-29 DIAGNOSIS — S82842D Displaced bimalleolar fracture of left lower leg, subsequent encounter for closed fracture with routine healing: Secondary | ICD-10-CM | POA: Diagnosis not present

## 2017-08-29 DIAGNOSIS — R262 Difficulty in walking, not elsewhere classified: Secondary | ICD-10-CM | POA: Diagnosis not present

## 2017-08-29 DIAGNOSIS — E119 Type 2 diabetes mellitus without complications: Secondary | ICD-10-CM | POA: Diagnosis not present

## 2017-08-30 DIAGNOSIS — S82842D Displaced bimalleolar fracture of left lower leg, subsequent encounter for closed fracture with routine healing: Secondary | ICD-10-CM | POA: Diagnosis not present

## 2017-08-30 DIAGNOSIS — E119 Type 2 diabetes mellitus without complications: Secondary | ICD-10-CM | POA: Diagnosis not present

## 2017-08-30 DIAGNOSIS — Z7984 Long term (current) use of oral hypoglycemic drugs: Secondary | ICD-10-CM | POA: Diagnosis not present

## 2017-08-30 DIAGNOSIS — S32039D Unspecified fracture of third lumbar vertebra, subsequent encounter for fracture with routine healing: Secondary | ICD-10-CM | POA: Diagnosis not present

## 2017-08-30 DIAGNOSIS — R262 Difficulty in walking, not elsewhere classified: Secondary | ICD-10-CM | POA: Diagnosis not present

## 2017-09-03 ENCOUNTER — Other Ambulatory Visit: Payer: Self-pay | Admitting: Family Medicine

## 2017-09-03 DIAGNOSIS — E119 Type 2 diabetes mellitus without complications: Secondary | ICD-10-CM | POA: Diagnosis not present

## 2017-09-03 DIAGNOSIS — R262 Difficulty in walking, not elsewhere classified: Secondary | ICD-10-CM | POA: Diagnosis not present

## 2017-09-03 DIAGNOSIS — S32039D Unspecified fracture of third lumbar vertebra, subsequent encounter for fracture with routine healing: Secondary | ICD-10-CM | POA: Diagnosis not present

## 2017-09-03 DIAGNOSIS — S82842D Displaced bimalleolar fracture of left lower leg, subsequent encounter for closed fracture with routine healing: Secondary | ICD-10-CM | POA: Diagnosis not present

## 2017-09-03 DIAGNOSIS — Z7984 Long term (current) use of oral hypoglycemic drugs: Secondary | ICD-10-CM | POA: Diagnosis not present

## 2017-09-03 MED ORDER — ALPRAZOLAM 1 MG PO TABS: 1.0000 mg | ORAL_TABLET | Freq: Three times a day (TID) | ORAL | 0 refills | Status: DC | PRN

## 2017-09-04 DIAGNOSIS — R262 Difficulty in walking, not elsewhere classified: Secondary | ICD-10-CM | POA: Diagnosis not present

## 2017-09-04 DIAGNOSIS — Z7984 Long term (current) use of oral hypoglycemic drugs: Secondary | ICD-10-CM | POA: Diagnosis not present

## 2017-09-04 DIAGNOSIS — E119 Type 2 diabetes mellitus without complications: Secondary | ICD-10-CM | POA: Diagnosis not present

## 2017-09-04 DIAGNOSIS — S82842D Displaced bimalleolar fracture of left lower leg, subsequent encounter for closed fracture with routine healing: Secondary | ICD-10-CM | POA: Diagnosis not present

## 2017-09-04 DIAGNOSIS — S32039D Unspecified fracture of third lumbar vertebra, subsequent encounter for fracture with routine healing: Secondary | ICD-10-CM | POA: Diagnosis not present

## 2017-09-10 DIAGNOSIS — E119 Type 2 diabetes mellitus without complications: Secondary | ICD-10-CM | POA: Diagnosis not present

## 2017-09-10 DIAGNOSIS — S32039D Unspecified fracture of third lumbar vertebra, subsequent encounter for fracture with routine healing: Secondary | ICD-10-CM | POA: Diagnosis not present

## 2017-09-10 DIAGNOSIS — S82842D Displaced bimalleolar fracture of left lower leg, subsequent encounter for closed fracture with routine healing: Secondary | ICD-10-CM | POA: Diagnosis not present

## 2017-09-10 DIAGNOSIS — Z7984 Long term (current) use of oral hypoglycemic drugs: Secondary | ICD-10-CM | POA: Diagnosis not present

## 2017-09-10 DIAGNOSIS — R262 Difficulty in walking, not elsewhere classified: Secondary | ICD-10-CM | POA: Diagnosis not present

## 2017-09-18 DIAGNOSIS — Z7984 Long term (current) use of oral hypoglycemic drugs: Secondary | ICD-10-CM | POA: Diagnosis not present

## 2017-09-18 DIAGNOSIS — E119 Type 2 diabetes mellitus without complications: Secondary | ICD-10-CM | POA: Diagnosis not present

## 2017-09-18 DIAGNOSIS — R262 Difficulty in walking, not elsewhere classified: Secondary | ICD-10-CM | POA: Diagnosis not present

## 2017-09-18 DIAGNOSIS — S32039D Unspecified fracture of third lumbar vertebra, subsequent encounter for fracture with routine healing: Secondary | ICD-10-CM | POA: Diagnosis not present

## 2017-09-18 DIAGNOSIS — S82842D Displaced bimalleolar fracture of left lower leg, subsequent encounter for closed fracture with routine healing: Secondary | ICD-10-CM | POA: Diagnosis not present

## 2017-09-20 DIAGNOSIS — Z7984 Long term (current) use of oral hypoglycemic drugs: Secondary | ICD-10-CM | POA: Diagnosis not present

## 2017-09-20 DIAGNOSIS — R262 Difficulty in walking, not elsewhere classified: Secondary | ICD-10-CM | POA: Diagnosis not present

## 2017-09-20 DIAGNOSIS — S32039D Unspecified fracture of third lumbar vertebra, subsequent encounter for fracture with routine healing: Secondary | ICD-10-CM | POA: Diagnosis not present

## 2017-09-20 DIAGNOSIS — E119 Type 2 diabetes mellitus without complications: Secondary | ICD-10-CM | POA: Diagnosis not present

## 2017-09-20 DIAGNOSIS — S82842D Displaced bimalleolar fracture of left lower leg, subsequent encounter for closed fracture with routine healing: Secondary | ICD-10-CM | POA: Diagnosis not present

## 2017-09-23 DIAGNOSIS — M545 Low back pain: Secondary | ICD-10-CM | POA: Diagnosis not present

## 2017-09-24 DIAGNOSIS — R262 Difficulty in walking, not elsewhere classified: Secondary | ICD-10-CM | POA: Diagnosis not present

## 2017-09-24 DIAGNOSIS — S32039D Unspecified fracture of third lumbar vertebra, subsequent encounter for fracture with routine healing: Secondary | ICD-10-CM | POA: Diagnosis not present

## 2017-09-24 DIAGNOSIS — E119 Type 2 diabetes mellitus without complications: Secondary | ICD-10-CM | POA: Diagnosis not present

## 2017-09-24 DIAGNOSIS — Z7984 Long term (current) use of oral hypoglycemic drugs: Secondary | ICD-10-CM | POA: Diagnosis not present

## 2017-09-24 DIAGNOSIS — S82842D Displaced bimalleolar fracture of left lower leg, subsequent encounter for closed fracture with routine healing: Secondary | ICD-10-CM | POA: Diagnosis not present

## 2017-09-26 DIAGNOSIS — S82842D Displaced bimalleolar fracture of left lower leg, subsequent encounter for closed fracture with routine healing: Secondary | ICD-10-CM | POA: Diagnosis not present

## 2017-09-26 DIAGNOSIS — Z7984 Long term (current) use of oral hypoglycemic drugs: Secondary | ICD-10-CM | POA: Diagnosis not present

## 2017-09-26 DIAGNOSIS — R262 Difficulty in walking, not elsewhere classified: Secondary | ICD-10-CM | POA: Diagnosis not present

## 2017-09-26 DIAGNOSIS — E119 Type 2 diabetes mellitus without complications: Secondary | ICD-10-CM | POA: Diagnosis not present

## 2017-09-26 DIAGNOSIS — S32039D Unspecified fracture of third lumbar vertebra, subsequent encounter for fracture with routine healing: Secondary | ICD-10-CM | POA: Diagnosis not present

## 2017-09-30 DIAGNOSIS — M545 Low back pain: Secondary | ICD-10-CM | POA: Diagnosis not present

## 2017-10-02 DIAGNOSIS — Z7984 Long term (current) use of oral hypoglycemic drugs: Secondary | ICD-10-CM | POA: Diagnosis not present

## 2017-10-02 DIAGNOSIS — R262 Difficulty in walking, not elsewhere classified: Secondary | ICD-10-CM | POA: Diagnosis not present

## 2017-10-02 DIAGNOSIS — S32039D Unspecified fracture of third lumbar vertebra, subsequent encounter for fracture with routine healing: Secondary | ICD-10-CM | POA: Diagnosis not present

## 2017-10-02 DIAGNOSIS — E119 Type 2 diabetes mellitus without complications: Secondary | ICD-10-CM | POA: Diagnosis not present

## 2017-10-02 DIAGNOSIS — S82842D Displaced bimalleolar fracture of left lower leg, subsequent encounter for closed fracture with routine healing: Secondary | ICD-10-CM | POA: Diagnosis not present

## 2017-10-03 DIAGNOSIS — S82842D Displaced bimalleolar fracture of left lower leg, subsequent encounter for closed fracture with routine healing: Secondary | ICD-10-CM | POA: Diagnosis not present

## 2017-10-03 DIAGNOSIS — Z7984 Long term (current) use of oral hypoglycemic drugs: Secondary | ICD-10-CM | POA: Diagnosis not present

## 2017-10-03 DIAGNOSIS — E119 Type 2 diabetes mellitus without complications: Secondary | ICD-10-CM | POA: Diagnosis not present

## 2017-10-03 DIAGNOSIS — S32039D Unspecified fracture of third lumbar vertebra, subsequent encounter for fracture with routine healing: Secondary | ICD-10-CM | POA: Diagnosis not present

## 2017-10-03 DIAGNOSIS — R262 Difficulty in walking, not elsewhere classified: Secondary | ICD-10-CM | POA: Diagnosis not present

## 2017-10-04 DIAGNOSIS — M545 Low back pain: Secondary | ICD-10-CM | POA: Diagnosis not present

## 2017-10-07 ENCOUNTER — Other Ambulatory Visit: Payer: Self-pay | Admitting: Orthopedic Surgery

## 2017-10-07 DIAGNOSIS — S32039D Unspecified fracture of third lumbar vertebra, subsequent encounter for fracture with routine healing: Secondary | ICD-10-CM | POA: Diagnosis not present

## 2017-10-07 DIAGNOSIS — S82842D Displaced bimalleolar fracture of left lower leg, subsequent encounter for closed fracture with routine healing: Secondary | ICD-10-CM | POA: Diagnosis not present

## 2017-10-07 DIAGNOSIS — Z7984 Long term (current) use of oral hypoglycemic drugs: Secondary | ICD-10-CM | POA: Diagnosis not present

## 2017-10-07 DIAGNOSIS — E119 Type 2 diabetes mellitus without complications: Secondary | ICD-10-CM | POA: Diagnosis not present

## 2017-10-07 DIAGNOSIS — R262 Difficulty in walking, not elsewhere classified: Secondary | ICD-10-CM | POA: Diagnosis not present

## 2017-10-09 DIAGNOSIS — E119 Type 2 diabetes mellitus without complications: Secondary | ICD-10-CM | POA: Diagnosis not present

## 2017-10-09 DIAGNOSIS — S32039D Unspecified fracture of third lumbar vertebra, subsequent encounter for fracture with routine healing: Secondary | ICD-10-CM | POA: Diagnosis not present

## 2017-10-09 DIAGNOSIS — S82842D Displaced bimalleolar fracture of left lower leg, subsequent encounter for closed fracture with routine healing: Secondary | ICD-10-CM | POA: Diagnosis not present

## 2017-10-09 DIAGNOSIS — R262 Difficulty in walking, not elsewhere classified: Secondary | ICD-10-CM | POA: Diagnosis not present

## 2017-10-09 DIAGNOSIS — Z7984 Long term (current) use of oral hypoglycemic drugs: Secondary | ICD-10-CM | POA: Diagnosis not present

## 2017-10-11 NOTE — Pre-Procedure Instructions (Signed)
Dawn Moore  10/11/2017      [facility] - [place], [place] [address] Phone: [phone] Fax: [phone]  [facility] [place], [place] - [place] [address] Phone: [phone] Fax: [place], [place] - [place] [address] [place] [address] Phone: [phone] Fax: [phone]    Your procedure is scheduled on October 17, 2017.  Report to [facility] Admitting at 715 AM.  Call this number if you have problems the morning of surgery:  [phone]   Remember:  Do not eat food or drink liquids after midnight.  Take these medicines the morning of surgery with A SIP OF WATER alprazolam (xanax), gabapentin (neurontin), hydrocodone-acetaminophen (norco)-if needed for pain, venlafaxine XR (effexor)  7 days prior to surgery STOP taking any Aspirin (unless otherwise instructed by your surgeon), Aleve, Naproxen, Ibuprofen, Motrin, Advil, Goody's, BC's, all herbal medications, fish oil, and all vitamins  Continue all other medications as instructed by your physician except follow the above medication instructions before surgery      WHAT DO I DO ABOUT MY DIABETES MEDICATION?   Marland Kitchen Do not take oral diabetes medicines (pills) the morning of surgery, metformin (glucophage).   How to Manage Your Diabetes Before and After Surgery  Why is it important to control my blood sugar before and after surgery? . Improving blood sugar levels before and after surgery helps healing and can limit problems. . A way of improving blood sugar control is eating a healthy diet by: o  Eating less sugar and carbohydrates o  Increasing activity/exercise o  Talking with your doctor about reaching your blood sugar goals . High blood sugars (greater than 180 mg/dL) can raise your risk of infections and  slow your recovery, so you will need to focus on controlling your diabetes during the weeks before surgery. . Make sure that the doctor who takes care of your diabetes knows about your planned surgery including the date and location.  How do I manage my blood sugar before surgery? . Check your blood sugar at least 4 times a day, starting 2 days before surgery, to make sure that the level is not too high or low. o Check your blood sugar the morning of your surgery when you wake up and every 2 hours until you get to the Short Stay unit. . If your blood sugar is less than 70 mg/dL, you will need to treat for low blood sugar: o Do not take insulin. o Treat a low blood sugar (less than 70 mg/dL) with  cup of clear juice (cranberry or apple), 4 glucose tablets, OR glucose gel. o Recheck blood sugar in 15 minutes after treatment (to make sure it is greater than 70 mg/dL). If your blood sugar is not greater than 70 mg/dL on recheck, call [phone] for further instructions. . Report your blood sugar to the short stay nurse when you get to Short Stay.  . If you are admitted to the hospital after surgery: o Your blood sugar will be checked by the staff and you will probably be given insulin after surgery (instead of oral diabetes medicines) to make sure you have good blood sugar levels. o The goal for blood sugar control after surgery is 80-180 mg/dL.   . The day of surgery, do not take other diabetes injectables, including Byetta (exenatide), Bydureon (  exenatide ER), Victoza (liraglutide), or Trulicity (dulaglutide).  . If your CBG is greater than 220 mg/dL, you may take  of your sliding scale (correction) dose of insulin.  Reviewed and Endorsed by [facility] Patient Education Committee, August 2015   Do not wear jewelry, make-up or nail polish.  Do not wear lotions, powders, or perfumes, or deoderant.  Do not shave 48 hours prior to surgery.  Men may shave face and neck.  Do not bring  valuables to the hospital.  [facility] is not responsible for any belongings or valuables.  Contacts, dentures or bridgework may not be worn into surgery.  Leave your suitcase in the car.  After surgery it may be brought to your room.  For patients admitted to the hospital, discharge time will be determined by your treatment team.  Patients discharged the day of surgery will not be allowed to drive home.   Special instructions:   [place]- Preparing For Surgery  Before surgery, you can play an important role. Because skin is not sterile, your skin needs to be as free of germs as possible. You can reduce the number of germs on your skin by washing with CHG (chlorahexidine gluconate) Soap before surgery.  CHG is an antiseptic cleaner which kills germs and bonds with the skin to continue killing germs even after washing.  Please do not use if you have an allergy to CHG or antibacterial soaps. If your skin becomes reddened/irritated stop using the CHG.  Do not shave (including legs and underarms) for at least 48 hours prior to first CHG shower. It is OK to shave your face.  Please follow these instructions carefully.   1. Shower the NIGHT BEFORE SURGERY and the MORNING OF SURGERY with CHG.   2. If you chose to wash your hair, wash your hair first as usual with your normal shampoo.  3. After you shampoo, rinse your hair and body thoroughly to remove the shampoo.  4. Use CHG as you would any other liquid soap. You can apply CHG directly to the skin and wash gently with a scrungie or a clean washcloth.   5. Apply the CHG Soap to your body ONLY FROM THE NECK DOWN.  Do not use on open wounds or open sores. Avoid contact with your eyes, ears, mouth and genitals (private parts). Wash Face and genitals (private parts)  with your normal soap.  6. Wash thoroughly, paying special attention to the area where your surgery will be performed.  7. Thoroughly rinse your body with warm water from the  neck down.  8. DO NOT shower/wash with your normal soap after using and rinsing off the CHG Soap.  9. Pat yourself dry with a CLEAN TOWEL.  10. Wear CLEAN PAJAMAS to bed the night before surgery, wear comfortable clothes the morning of surgery  11. Place CLEAN SHEETS on your bed the night of your first shower and DO NOT SLEEP WITH PETS.    Day of Surgery: Do not apply any deodorants/lotions. Please wear clean clothes to the hospital/surgery center.     Please read over the following fact sheets that you were given. Pain Booklet, Coughing and Deep Breathing, MRSA Information and Surgical Site Infection Prevention

## 2017-10-14 ENCOUNTER — Ambulatory Visit (HOSPITAL_COMMUNITY)
Admission: RE | Admit: 2017-10-14 | Discharge: 2017-10-14 | Disposition: A | Discharge status: Home / Self Care | Payer: Medicare HMO | Source: Ambulatory Visit | Attending: Orthopedic Surgery | Admitting: Orthopedic Surgery

## 2017-10-14 ENCOUNTER — Encounter (HOSPITAL_COMMUNITY): Payer: Self-pay

## 2017-10-14 ENCOUNTER — Encounter (HOSPITAL_COMMUNITY)
Admission: RE | Admit: 2017-10-14 | Discharge: 2017-10-14 | Disposition: A | Discharge status: Home / Self Care | Payer: Medicare HMO | Source: Ambulatory Visit | Attending: Orthopedic Surgery | Admitting: Orthopedic Surgery

## 2017-10-14 DIAGNOSIS — I700 Atherosclerosis of aorta: Secondary | ICD-10-CM | POA: Diagnosis not present

## 2017-10-14 DIAGNOSIS — Z01818 Encounter for other preprocedural examination: Secondary | ICD-10-CM

## 2017-10-14 DIAGNOSIS — Z79899 Other long term (current) drug therapy: Secondary | ICD-10-CM | POA: Diagnosis not present

## 2017-10-14 DIAGNOSIS — Z7982 Long term (current) use of aspirin: Secondary | ICD-10-CM | POA: Diagnosis not present

## 2017-10-14 DIAGNOSIS — Z7984 Long term (current) use of oral hypoglycemic drugs: Secondary | ICD-10-CM | POA: Diagnosis not present

## 2017-10-14 DIAGNOSIS — M4856XA Collapsed vertebra, not elsewhere classified, lumbar region, initial encounter for fracture: Secondary | ICD-10-CM | POA: Diagnosis not present

## 2017-10-14 DIAGNOSIS — D691 Qualitative platelet defects: Secondary | ICD-10-CM | POA: Insufficient documentation

## 2017-10-14 DIAGNOSIS — E119 Type 2 diabetes mellitus without complications: Secondary | ICD-10-CM | POA: Insufficient documentation

## 2017-10-14 DIAGNOSIS — G4733 Obstructive sleep apnea (adult) (pediatric): Secondary | ICD-10-CM | POA: Insufficient documentation

## 2017-10-14 DIAGNOSIS — K219 Gastro-esophageal reflux disease without esophagitis: Secondary | ICD-10-CM | POA: Insufficient documentation

## 2017-10-14 DIAGNOSIS — Z01812 Encounter for preprocedural laboratory examination: Secondary | ICD-10-CM | POA: Insufficient documentation

## 2017-10-14 LAB — URINALYSIS, ROUTINE W REFLEX MICROSCOPIC
BILIRUBIN URINE: NEGATIVE
Glucose, UA: NEGATIVE mg/dL
Hgb urine dipstick: NEGATIVE
KETONES UR: NEGATIVE mg/dL
Nitrite: NEGATIVE
PROTEIN: NEGATIVE mg/dL
Specific Gravity, Urine: 1.018 (ref 1.005–1.030)
pH: 5 (ref 5.0–8.0)

## 2017-10-14 LAB — CBC WITH DIFFERENTIAL/PLATELET
BASOS ABS: 0 10*3/uL (ref 0.0–0.1)
Basophils Relative: 0 %
Eosinophils Absolute: 0.1 10*3/uL (ref 0.0–0.7)
Eosinophils Relative: 1 %
HEMATOCRIT: 40.7 % (ref 36.0–46.0)
HEMOGLOBIN: 12.7 g/dL (ref 12.0–15.0)
LYMPHS ABS: 2 10*3/uL (ref 0.7–4.0)
LYMPHS PCT: 37 %
MCH: 26.9 pg (ref 26.0–34.0)
MCHC: 31.2 g/dL (ref 30.0–36.0)
MCV: 86.2 fL (ref 78.0–100.0)
Monocytes Absolute: 0.4 10*3/uL (ref 0.1–1.0)
Monocytes Relative: 8 %
NEUTROS ABS: 2.9 10*3/uL (ref 1.7–7.7)
NEUTROS PCT: 54 %
Platelets: 124 10*3/uL — ABNORMAL LOW (ref 150–400)
RBC: 4.72 MIL/uL (ref 3.87–5.11)
RDW: 15.9 % — ABNORMAL HIGH (ref 11.5–15.5)
WBC: 5.4 10*3/uL (ref 4.0–10.5)

## 2017-10-14 LAB — COMPREHENSIVE METABOLIC PANEL
ALK PHOS: 105 U/L (ref 38–126)
ALT: 19 U/L (ref 14–54)
AST: 36 U/L (ref 15–41)
Albumin: 3.8 g/dL (ref 3.5–5.0)
Anion gap: 7 (ref 5–15)
BUN: 6 mg/dL (ref 6–20)
CALCIUM: 9.1 mg/dL (ref 8.9–10.3)
CHLORIDE: 104 mmol/L (ref 101–111)
CO2: 28 mmol/L (ref 22–32)
CREATININE: 0.97 mg/dL (ref 0.44–1.00)
GFR, EST NON AFRICAN AMERICAN: 56 mL/min — AB (ref >60)
Glucose, Bld: 110 mg/dL — ABNORMAL HIGH (ref 65–99)
Potassium: 4.1 mmol/L (ref 3.5–5.1)
Sodium: 139 mmol/L (ref 135–145)
TOTAL PROTEIN: 7.2 g/dL (ref 6.5–8.1)
Total Bilirubin: 0.9 mg/dL (ref 0.3–1.2)

## 2017-10-14 LAB — APTT: APTT: 36 s (ref 24–36)

## 2017-10-14 LAB — SURGICAL PCR SCREEN
MRSA, PCR: NEGATIVE
Staphylococcus aureus: NEGATIVE

## 2017-10-14 LAB — GLUCOSE, CAPILLARY: Glucose-Capillary: 108 mg/dL — ABNORMAL HIGH (ref 65–99)

## 2017-10-14 LAB — PROTIME-INR
INR: 1.13
Prothrombin Time: 14.4 seconds (ref 11.4–15.2)

## 2017-10-14 NOTE — Progress Notes (Addendum)
PJS:RPRXYVO, Barb Merino, DO  Cardiologist: pt denies  EKG: 07/29/17  Stress test: pt denies ever  ECHO: pt denies ever  Cardiac Cath: pt denies ever  Chest x-ray: pt denies past year

## 2017-10-15 DIAGNOSIS — R262 Difficulty in walking, not elsewhere classified: Secondary | ICD-10-CM | POA: Diagnosis not present

## 2017-10-15 DIAGNOSIS — S32039D Unspecified fracture of third lumbar vertebra, subsequent encounter for fracture with routine healing: Secondary | ICD-10-CM | POA: Diagnosis not present

## 2017-10-15 DIAGNOSIS — Z7984 Long term (current) use of oral hypoglycemic drugs: Secondary | ICD-10-CM | POA: Diagnosis not present

## 2017-10-15 DIAGNOSIS — S82842D Displaced bimalleolar fracture of left lower leg, subsequent encounter for closed fracture with routine healing: Secondary | ICD-10-CM | POA: Diagnosis not present

## 2017-10-15 DIAGNOSIS — E119 Type 2 diabetes mellitus without complications: Secondary | ICD-10-CM | POA: Diagnosis not present

## 2017-10-15 NOTE — Progress Notes (Signed)
Anesthesia Chart Review:  Pt is a 76 year old female scheduled for L3 kyphoplasty on 10/17/2017 with Phylliss Bob, MD  - PCP is Park Liter, DO  PMH includes:  DM, OSA, thrombocytopathia, GERD. Never smoker. BMI 34. S/p cataract extraction 03/28/16 and 03/07/16.   Medications include: ASA 81mg , metformin, prilosec, seroquel, simvastatin  BP 101/76   Pulse 94   Temp (!) 36.3 C   Resp 20   Ht 5\' 6"  (1.676 m)   Wt 211 lb 8 oz (95.9 kg)   SpO2 96%   BMI 34.14 kg/m   Preoperative labs reviewed.   - UA consistent with UTI. I left voicemail for Carla in Dr. Laurena Bering office with these results.  - Glucose 110. HbA1c was 5.8 on 07/23/17.   CXR 10/14/17:  - There is no acute cardiopulmonary abnormality. - Thoracic aortic atherosclerosis.  EKG 07/28/17: NSR  If no changes, I anticipate pt can proceed with surgery as scheduled.   Willeen Cass, FNP-BC [facility] Short Stay Surgical Center/Anesthesiology Phone: [phone] 10/15/2017 11:21 AM

## 2017-10-17 ENCOUNTER — Ambulatory Visit (HOSPITAL_COMMUNITY): Payer: Medicare HMO | Admitting: Anesthesiology

## 2017-10-17 ENCOUNTER — Ambulatory Visit (HOSPITAL_COMMUNITY)
Admission: RE | Admit: 2017-10-17 | Discharge: 2017-10-17 | Disposition: A | Discharge status: Home / Self Care | Payer: Medicare HMO | Source: Ambulatory Visit | Attending: Orthopedic Surgery | Admitting: Orthopedic Surgery

## 2017-10-17 ENCOUNTER — Encounter (HOSPITAL_COMMUNITY): Payer: Self-pay | Admitting: *Deleted

## 2017-10-17 ENCOUNTER — Ambulatory Visit (HOSPITAL_COMMUNITY): Payer: Medicare HMO

## 2017-10-17 ENCOUNTER — Ambulatory Visit (HOSPITAL_COMMUNITY): Payer: Medicare HMO | Admitting: Emergency Medicine

## 2017-10-17 ENCOUNTER — Encounter (HOSPITAL_COMMUNITY)
Admission: RE | Disposition: A | Discharge status: Home / Self Care | Payer: Self-pay | Source: Ambulatory Visit | Attending: Orthopedic Surgery

## 2017-10-17 DIAGNOSIS — Z888 Allergy status to other drugs, medicaments and biological substances status: Secondary | ICD-10-CM | POA: Diagnosis not present

## 2017-10-17 DIAGNOSIS — K219 Gastro-esophageal reflux disease without esophagitis: Secondary | ICD-10-CM | POA: Diagnosis not present

## 2017-10-17 DIAGNOSIS — W010XXA Fall on same level from slipping, tripping and stumbling without subsequent striking against object, initial encounter: Secondary | ICD-10-CM | POA: Insufficient documentation

## 2017-10-17 DIAGNOSIS — F419 Anxiety disorder, unspecified: Secondary | ICD-10-CM | POA: Diagnosis not present

## 2017-10-17 DIAGNOSIS — M170 Bilateral primary osteoarthritis of knee: Secondary | ICD-10-CM | POA: Diagnosis not present

## 2017-10-17 DIAGNOSIS — M8580 Other specified disorders of bone density and structure, unspecified site: Secondary | ICD-10-CM | POA: Diagnosis not present

## 2017-10-17 DIAGNOSIS — Z886 Allergy status to analgesic agent status: Secondary | ICD-10-CM | POA: Diagnosis not present

## 2017-10-17 DIAGNOSIS — E119 Type 2 diabetes mellitus without complications: Secondary | ICD-10-CM | POA: Insufficient documentation

## 2017-10-17 DIAGNOSIS — D696 Thrombocytopenia, unspecified: Secondary | ICD-10-CM | POA: Insufficient documentation

## 2017-10-17 DIAGNOSIS — Z79899 Other long term (current) drug therapy: Secondary | ICD-10-CM | POA: Insufficient documentation

## 2017-10-17 DIAGNOSIS — F329 Major depressive disorder, single episode, unspecified: Secondary | ICD-10-CM | POA: Insufficient documentation

## 2017-10-17 DIAGNOSIS — M4856XA Collapsed vertebra, not elsewhere classified, lumbar region, initial encounter for fracture: Secondary | ICD-10-CM | POA: Diagnosis not present

## 2017-10-17 DIAGNOSIS — J45909 Unspecified asthma, uncomplicated: Secondary | ICD-10-CM | POA: Insufficient documentation

## 2017-10-17 DIAGNOSIS — F418 Other specified anxiety disorders: Secondary | ICD-10-CM | POA: Diagnosis not present

## 2017-10-17 DIAGNOSIS — Z7984 Long term (current) use of oral hypoglycemic drugs: Secondary | ICD-10-CM | POA: Diagnosis not present

## 2017-10-17 DIAGNOSIS — I10 Essential (primary) hypertension: Secondary | ICD-10-CM | POA: Insufficient documentation

## 2017-10-17 DIAGNOSIS — G4730 Sleep apnea, unspecified: Secondary | ICD-10-CM | POA: Insufficient documentation

## 2017-10-17 DIAGNOSIS — S32030A Wedge compression fracture of third lumbar vertebra, initial encounter for closed fracture: Secondary | ICD-10-CM | POA: Diagnosis not present

## 2017-10-17 DIAGNOSIS — M19072 Primary osteoarthritis, left ankle and foot: Secondary | ICD-10-CM | POA: Insufficient documentation

## 2017-10-17 DIAGNOSIS — M19042 Primary osteoarthritis, left hand: Secondary | ICD-10-CM | POA: Insufficient documentation

## 2017-10-17 DIAGNOSIS — Z419 Encounter for procedure for purposes other than remedying health state, unspecified: Secondary | ICD-10-CM

## 2017-10-17 DIAGNOSIS — Z7982 Long term (current) use of aspirin: Secondary | ICD-10-CM | POA: Diagnosis not present

## 2017-10-17 DIAGNOSIS — Z6834 Body mass index (BMI) 34.0-34.9, adult: Secondary | ICD-10-CM | POA: Insufficient documentation

## 2017-10-17 DIAGNOSIS — M19071 Primary osteoarthritis, right ankle and foot: Secondary | ICD-10-CM | POA: Insufficient documentation

## 2017-10-17 DIAGNOSIS — M47816 Spondylosis without myelopathy or radiculopathy, lumbar region: Secondary | ICD-10-CM | POA: Diagnosis not present

## 2017-10-17 DIAGNOSIS — M19041 Primary osteoarthritis, right hand: Secondary | ICD-10-CM | POA: Diagnosis not present

## 2017-10-17 HISTORY — PX: KYPHOPLASTY: SHX5884

## 2017-10-17 LAB — GLUCOSE, CAPILLARY
GLUCOSE-CAPILLARY: 87 mg/dL (ref 65–99)
Glucose-Capillary: 102 mg/dL — ABNORMAL HIGH (ref 65–99)
Glucose-Capillary: 96 mg/dL (ref 65–99)

## 2017-10-17 LAB — HEMOGLOBIN A1C
Hgb A1c MFr Bld: 6.1 % — ABNORMAL HIGH (ref 4.8–5.6)
Mean Plasma Glucose: 128.37 mg/dL

## 2017-10-17 SURGERY — KYPHOPLASTY
Anesthesia: General | Site: Spine Lumbar

## 2017-10-17 MED ORDER — ONDANSETRON HCL 4 MG/2ML IJ SOLN
INTRAMUSCULAR | Status: AC
  Filled 2017-10-17: qty 2

## 2017-10-17 MED ORDER — PROPOFOL 10 MG/ML IV BOLUS
INTRAVENOUS | Status: DC | PRN
  Administered 2017-10-17: 170 mg via INTRAVENOUS

## 2017-10-17 MED ORDER — CEFAZOLIN SODIUM-DEXTROSE 2-4 GM/100ML-% IV SOLN
2.0000 g | INTRAVENOUS | Status: AC
  Administered 2017-10-17: 2 g via INTRAVENOUS
  Filled 2017-10-17: qty 100

## 2017-10-17 MED ORDER — ONDANSETRON HCL 4 MG/2ML IJ SOLN
INTRAMUSCULAR | Status: DC | PRN
Start: 2017-10-17 — End: 2017-10-17
  Administered 2017-10-17: 4 mg via INTRAVENOUS

## 2017-10-17 MED ORDER — FENTANYL CITRATE (PF) 250 MCG/5ML IJ SOLN
INTRAMUSCULAR | Status: AC
  Filled 2017-10-17: qty 5

## 2017-10-17 MED ORDER — LACTATED RINGERS IV SOLN
INTRAVENOUS | Status: DC
  Administered 2017-10-17: 08:00:00 via INTRAVENOUS

## 2017-10-17 MED ORDER — DIAZEPAM 5 MG PO TABS
5.0000 mg | ORAL_TABLET | Freq: Once | ORAL | Status: AC | PRN
  Administered 2017-10-17: 5 mg via ORAL

## 2017-10-17 MED ORDER — MIDAZOLAM HCL 2 MG/2ML IJ SOLN
INTRAMUSCULAR | Status: AC
  Filled 2017-10-17: qty 2

## 2017-10-17 MED ORDER — PROPOFOL 10 MG/ML IV BOLUS
INTRAVENOUS | Status: AC
  Filled 2017-10-17: qty 20

## 2017-10-17 MED ORDER — SUGAMMADEX SODIUM 200 MG/2ML IV SOLN
INTRAVENOUS | Status: DC | PRN
  Administered 2017-10-17: 200 mg via INTRAVENOUS

## 2017-10-17 MED ORDER — BUPIVACAINE-EPINEPHRINE (PF) 0.25% -1:200000 IJ SOLN
INTRAMUSCULAR | Status: AC
  Filled 2017-10-17: qty 30

## 2017-10-17 MED ORDER — PHENYLEPHRINE 40 MCG/ML (10ML) SYRINGE FOR IV PUSH (FOR BLOOD PRESSURE SUPPORT)
PREFILLED_SYRINGE | INTRAVENOUS | Status: DC | PRN
  Administered 2017-10-17: 80 ug via INTRAVENOUS

## 2017-10-17 MED ORDER — OXYCODONE-ACETAMINOPHEN 5-325 MG PO TABS
ORAL_TABLET | ORAL | Status: AC
  Administered 2017-10-17: 2 via ORAL
  Filled 2017-10-17: qty 2

## 2017-10-17 MED ORDER — OXYCODONE-ACETAMINOPHEN 5-325 MG PO TABS
1.0000 | ORAL_TABLET | Freq: Once | ORAL | Status: AC | PRN
  Administered 2017-10-17: 2 via ORAL

## 2017-10-17 MED ORDER — FENTANYL CITRATE (PF) 100 MCG/2ML IJ SOLN
INTRAMUSCULAR | Status: AC
  Administered 2017-10-17: 50 ug via INTRAVENOUS
  Filled 2017-10-17: qty 2

## 2017-10-17 MED ORDER — ROCURONIUM BROMIDE 50 MG/5ML IV SOLN
INTRAVENOUS | Status: AC
  Filled 2017-10-17: qty 1

## 2017-10-17 MED ORDER — BACITRACIN 500 UNIT/GM EX OINT
TOPICAL_OINTMENT | CUTANEOUS | Status: DC | PRN
  Administered 2017-10-17: 1 via TOPICAL

## 2017-10-17 MED ORDER — LIDOCAINE 2% (20 MG/ML) 5 ML SYRINGE
INTRAMUSCULAR | Status: AC
  Filled 2017-10-17: qty 5

## 2017-10-17 MED ORDER — DIAZEPAM 5 MG PO TABS
ORAL_TABLET | ORAL | Status: AC
  Administered 2017-10-17: 5 mg via ORAL
  Filled 2017-10-17: qty 1

## 2017-10-17 MED ORDER — LIDOCAINE 2% (20 MG/ML) 5 ML SYRINGE
INTRAMUSCULAR | Status: DC | PRN
  Administered 2017-10-17: 60 mg via INTRAVENOUS

## 2017-10-17 MED ORDER — ROCURONIUM BROMIDE 10 MG/ML (PF) SYRINGE
PREFILLED_SYRINGE | INTRAVENOUS | Status: DC | PRN
  Administered 2017-10-17: 50 mg via INTRAVENOUS

## 2017-10-17 MED ORDER — FENTANYL CITRATE (PF) 100 MCG/2ML IJ SOLN
25.0000 ug | INTRAMUSCULAR | Status: DC | PRN
  Administered 2017-10-17 (×2): 50 ug via INTRAVENOUS

## 2017-10-17 MED ORDER — BACITRACIN ZINC 500 UNIT/GM EX OINT
TOPICAL_OINTMENT | CUTANEOUS | Status: AC
  Filled 2017-10-17: qty 28.35

## 2017-10-17 MED ORDER — POVIDONE-IODINE 7.5 % EX SOLN: Freq: Once | CUTANEOUS | Status: DC

## 2017-10-17 MED ORDER — IOPAMIDOL (ISOVUE-300) INJECTION 61%
INTRAVENOUS | Status: AC
  Filled 2017-10-17: qty 50

## 2017-10-17 MED ORDER — MIDAZOLAM HCL 2 MG/2ML IJ SOLN
INTRAMUSCULAR | Status: DC | PRN
  Administered 2017-10-17: 1 mg via INTRAVENOUS

## 2017-10-17 MED ORDER — FENTANYL CITRATE (PF) 250 MCG/5ML IJ SOLN
INTRAMUSCULAR | Status: DC | PRN
  Administered 2017-10-17: 100 ug via INTRAVENOUS

## 2017-10-17 MED ORDER — 0.9 % SODIUM CHLORIDE (POUR BTL) OPTIME
TOPICAL | Status: DC | PRN
  Administered 2017-10-17: 1000 mL

## 2017-10-17 SURGICAL SUPPLY — 47 items
BENZOIN TINCTURE PRP APPL 2/3 (GAUZE/BANDAGES/DRESSINGS) ×3 IMPLANT
BLADE SURG 15 STRL LF DISP TIS (BLADE) ×1 IMPLANT
BLADE SURG 15 STRL SS (BLADE) ×2
CEMENT BONE KYPHON CDS (Cement) ×3 IMPLANT
CLOSURE WOUND 1/2 X4 (GAUZE/BANDAGES/DRESSINGS) ×1
COVER MAYO STAND STRL (DRAPES) ×3 IMPLANT
COVER SURGICAL LIGHT HANDLE (MISCELLANEOUS) ×3 IMPLANT
CURETTE EXPRESS SZ2 7MM (INSTRUMENTS) ×1 IMPLANT
CURRETTE EXPRESS SZ2 7MM (INSTRUMENTS) ×3
DRAPE C-ARM 42X72 X-RAY (DRAPES) ×3 IMPLANT
DRAPE HALF SHEET 40X57 (DRAPES) IMPLANT
DRAPE INCISE IOBAN 66X45 STRL (DRAPES) ×3 IMPLANT
DRAPE LAPAROTOMY T 102X78X121 (DRAPES) ×3 IMPLANT
DRAPE SURG 17X23 STRL (DRAPES) ×12 IMPLANT
DURAPREP 26ML APPLICATOR (WOUND CARE) ×3 IMPLANT
GAUZE SPONGE 2X2 8PLY STRL LF (GAUZE/BANDAGES/DRESSINGS) ×1 IMPLANT
GAUZE SPONGE 4X4 12PLY STRL (GAUZE/BANDAGES/DRESSINGS) ×3 IMPLANT
GAUZE SPONGE 4X4 16PLY XRAY LF (GAUZE/BANDAGES/DRESSINGS) ×3 IMPLANT
GLOVE BIO SURGEON STRL SZ7 (GLOVE) ×3 IMPLANT
GLOVE BIO SURGEON STRL SZ8 (GLOVE) ×3 IMPLANT
GLOVE BIOGEL PI IND STRL 7.0 (GLOVE) ×1 IMPLANT
GLOVE BIOGEL PI IND STRL 8 (GLOVE) ×1 IMPLANT
GLOVE BIOGEL PI INDICATOR 7.0 (GLOVE) ×2
GLOVE BIOGEL PI INDICATOR 8 (GLOVE) ×2
GOWN STRL REUS W/ TWL LRG LVL3 (GOWN DISPOSABLE) ×2 IMPLANT
GOWN STRL REUS W/ TWL XL LVL3 (GOWN DISPOSABLE) ×1 IMPLANT
GOWN STRL REUS W/TWL LRG LVL3 (GOWN DISPOSABLE) ×4
GOWN STRL REUS W/TWL XL LVL3 (GOWN DISPOSABLE) ×2
KIT BASIN OR (CUSTOM PROCEDURE TRAY) ×3 IMPLANT
KIT ROOM TURNOVER OR (KITS) ×3 IMPLANT
NEEDLE 22X1 1/2 (OR ONLY) (NEEDLE) IMPLANT
NEEDLE HYPO 25X1 1.5 SAFETY (NEEDLE) ×3 IMPLANT
NEEDLE SPNL 18GX3.5 QUINCKE PK (NEEDLE) ×6 IMPLANT
NS IRRIG 1000ML POUR BTL (IV SOLUTION) ×3 IMPLANT
PACK UNIVERSAL I (CUSTOM PROCEDURE TRAY) ×3 IMPLANT
PAD ARMBOARD 7.5X6 YLW CONV (MISCELLANEOUS) ×6 IMPLANT
POSITIONER HEAD PRONE TRACH (MISCELLANEOUS) ×3 IMPLANT
SPONGE GAUZE 2X2 STER 10/PKG (GAUZE/BANDAGES/DRESSINGS) ×2
STRIP CLOSURE SKIN 1/2X4 (GAUZE/BANDAGES/DRESSINGS) ×2 IMPLANT
SUT MNCRL AB 4-0 PS2 18 (SUTURE) ×3 IMPLANT
SYR BULB IRRIGATION 50ML (SYRINGE) ×3 IMPLANT
SYR CONTROL 10ML LL (SYRINGE) ×3 IMPLANT
TAPE CLOTH SURG 4X10 WHT LF (GAUZE/BANDAGES/DRESSINGS) ×3 IMPLANT
TOWEL OR 17X24 6PK STRL BLUE (TOWEL DISPOSABLE) ×3 IMPLANT
TOWEL OR 17X26 10 PK STRL BLUE (TOWEL DISPOSABLE) ×3 IMPLANT
TRAY KYPHOPAK 15/3 ONESTEP 1ST (MISCELLANEOUS) IMPLANT
TRAY KYPHOPAK 20/3 ONESTEP 1ST (MISCELLANEOUS) ×3 IMPLANT

## 2017-10-17 NOTE — Anesthesia Preprocedure Evaluation (Signed)
Anesthesia Evaluation  Patient identified by MRN, date of birth, ID band Patient awake    Reviewed: Allergy & Precautions, NPO status , Patient's Chart, lab work & pertinent test results  Airway Mallampati: II  TM Distance: >3 FB Neck ROM: Full    Dental  (+) Teeth Intact   Pulmonary shortness of breath, asthma ,    breath sounds clear to auscultation       Cardiovascular hypertension,  Rhythm:Regular Rate:Normal     Neuro/Psych    GI/Hepatic GERD  ,  Endo/Other  diabetesMorbid obesity  Renal/GU Renal InsufficiencyRenal disease     Musculoskeletal   Abdominal   Peds  Hematology   Anesthesia Other Findings   Reproductive/Obstetrics                             Anesthesia Physical Anesthesia Plan  ASA: III  Anesthesia Plan: General   Post-op Pain Management:    Induction: Intravenous  PONV Risk Score and Plan: 4 or greater and Ondansetron, Dexamethasone, Scopolamine patch - Pre-op and Treatment may vary due to age or medical condition  Airway Management Planned:   Additional Equipment:   Intra-op Plan:   Post-operative Plan: Extubation in OR  Informed Consent: I have reviewed the patients History and Physical, chart, labs and discussed the procedure including the risks, benefits and alternatives for the proposed anesthesia with the patient or authorized representative who has indicated his/her understanding and acceptance.   Dental advisory given  Plan Discussed with: CRNA  Anesthesia Plan Comments:         Anesthesia Quick Evaluation

## 2017-10-17 NOTE — Anesthesia Procedure Notes (Signed)
Procedure Name: Intubation Date/Time: 10/17/2017 10:56 AM Performed by: Julieta Bellini Pre-anesthesia Checklist: Patient identified, Emergency Drugs available, Suction available and Patient being monitored Patient Re-evaluated:Patient Re-evaluated prior to induction Oxygen Delivery Method: Circle system utilized Preoxygenation: Pre-oxygenation with 100% oxygen Induction Type: IV induction Ventilation: Mask ventilation without difficulty Laryngoscope Size: Mac and 3 Grade View: Grade I Tube type: Oral Number of attempts: 1 Airway Equipment and Method: Stylet Placement Confirmation: ETT inserted through vocal cords under direct vision,  positive ETCO2 and breath sounds checked- equal and bilateral Secured at: 22 cm Tube secured with: Tape Dental Injury: Teeth and Oropharynx as per pre-operative assessment

## 2017-10-17 NOTE — H&P (Signed)
PREOPERATIVE H&P  Chief Complaint: Low back pain  HPI: Dawn Moore is a 76 y.o. female who presents with ongoing pain in the low back x 3 months  Imaging reveals a subacute L3 compression fracture  Patient has failed multiple forms of conservative care and continues to have pain (see office notes for additional details regarding the patient's full course of treatment)  Past Medical History:  Diagnosis Date  . Anxiety   . Arthritis    OA knees, hands and feet  . Depression   . Diabetes mellitus without complication ([place])   . GERD (gastroesophageal reflux disease)   . Headache   . Osteopenia   . Shortness of breath dyspnea   . Sleep apnea    doesn't use CPAP  . 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 ABDOMINAL HYSTERECTOMY  1986   still has one ovary  . CATARACT EXTRACTION W/PHACO Left 03/07/2016   Procedure: CATARACT EXTRACTION PHACO AND INTRAOCULAR LENS PLACEMENT (IOC);  Surgeon: Leandrew Koyanagi, MD;  Location: [facility];  Service: Ophthalmology;  Laterality: Left;  DIABETIC -oral meds Sleep apnea  . CATARACT EXTRACTION W/PHACO Right 03/28/2016   Procedure: CATARACT EXTRACTION PHACO AND INTRAOCULAR LENS PLACEMENT (IOC);  Surgeon: Leandrew Koyanagi, MD;  Location: [facility];  Service: Ophthalmology;  Laterality: Right;  DIABETIC - oral meds  . CHOLECYSTECTOMY  1999  . COCHLEAR IMPLANT    . FOOT FRACTURE SURGERY    . HEEL SPUR SURGERY Left 11/04/09   Revision 01/2011  . INTRAOCULAR LENS INSERTION Bilateral    Social History   Social History  . Marital status: Widowed    Spouse name: N/A  . Number of children: N/A  . Years of education: N/A   Social History Main Topics  . Smoking status: Never Smoker  . Smokeless tobacco: Never Used  . Alcohol use No  . Drug use: No  . Sexual activity: No   Other Topics Concern  . Not on file   Social History Narrative  . No  narrative on file   Family History  Problem Relation Age of Onset  . Stroke Mother   . Dementia Mother   . Cancer Father        prostate  . Hyperlipidemia Son   . Hypertension Son   . Mental illness Son   . Alcohol abuse Son   . Arthritis Sister        rheumatoid  . Cancer Sister        breast  . Thyroid disease Sister   . Cancer Sister        thyroid  . Cancer Brother        prostate  . Arthritis Maternal Grandmother    Allergies  Allergen Reactions  . Nsaids Other (See Comments)    Per Hematology  . Wellbutrin [Bupropion] Other (See Comments)    Thoughts that she would die   Prior to Admission medications   Medication Sig Start Date End Date Taking? Authorizing Provider  ALPRAZolam Duanne Moron) 1 MG tablet Take 1 tablet (1 mg total) by mouth 3 (three) times daily as needed for anxiety. Patient taking differently: Take 1 mg by mouth 3 (three) times daily as needed for anxiety or sleep (takes every night for sleep and in the day only if needed for anxiety).  09/03/17  Yes Johnson, Megan P, DO  calcitonin, salmon, (MIACALCIN/FORTICAL) 200 UNIT/ACT nasal spray Place  1 spray into alternate nostrils daily.   Yes [provider]  Cholecalciferol (VITAMIN D-3) 5000 units TABS Take 5,000 Units by mouth daily.   Yes [provider]  gabapentin (NEURONTIN) 100 MG capsule TAKE 1 CAPSULE TWICE DAILY 05/20/17  Yes Johnson, Megan P, DO  HYDROcodone-acetaminophen (NORCO/VICODIN) 5-325 MG tablet Take 1 tablet by mouth 2 (two) times daily.    Yes [provider]  metFORMIN (GLUCOPHAGE) 500 MG tablet Take 1 tablet (500 mg total) by mouth daily with breakfast. 07/23/17  Yes Johnson, Megan P, DO  nystatin (MYCOSTATIN/NYSTOP) powder Apply topically 4 (four) times daily. Patient taking differently: Apply topically 2 (two) times daily as needed (rash).  07/23/17  Yes Johnson, Megan P, DO  nystatin ointment (MYCOSTATIN) Apply 1 application topically 2 (two) times daily. Patient  taking differently: Apply 1 application topically 2 (two) times daily as needed (rash).  07/23/17  Yes Johnson, Megan P, DO  omeprazole (PRILOSEC) 20 MG capsule Take 1 capsule (20 mg total) by mouth daily. Patient taking differently: Take 20 mg by mouth every evening.  04/18/17  Yes Johnson, Megan P, DO  QUEtiapine (SEROQUEL) 25 MG tablet TAKE 1 TABLET AT BEDTIME 04/08/17  Yes Johnson, Megan P, DO  simvastatin (ZOCOR) 10 MG tablet TAKE 1 TABLET EVERY EVENING 04/08/17  Yes Johnson, Megan P, DO  venlafaxine XR (EFFEXOR-XR) 150 MG 24 hr capsule TAKE 1 CAPSULE EVERY DAY (FOR TOTAL OF 225MG  PER DAY) Patient taking differently: Take 150 mg by mouth See admin instructions. TAKE 1 CAPSULE EVERY DAY ALONG WITH 75 MG  (FOR TOTAL OF 225MG  PER DAY) 04/18/17  Yes Johnson, Megan P, DO  venlafaxine XR (EFFEXOR-XR) 75 MG 24 hr capsule TAKE 1 CAPSULE DAILY WITH BREAKFAST (TOTAL DAILY DOSE IS 225MG ) Patient taking differently: TAKE 1 CAPSULE EVERY DAY ALONG WITH 150 MG  (FOR TOTAL OF 225MG  PER DAY) 03/11/17  Yes Johnson, Megan P, DO  aspirin EC 81 MG tablet Take 81 mg by mouth at bedtime. Reported on 02/28/2016 11/05/14 07/14/28  [provider]  diazepam (VALIUM) 5 MG tablet Take 1 tablet (5 mg total) by mouth every 8 (eight) hours as needed for muscle spasms. Patient not taking: Reported on 10/09/2017 07/28/17   Earleen Newport, MD  diazepam (VALIUM) 5 MG tablet Take 1 tablet (5 mg total) by mouth every 8 (eight) hours as needed (To be taken with meclizine for vertigo). Patient not taking: Reported on 10/09/2017 07/28/17   Earleen Newport, MD  lisinopril (PRINIVIL,ZESTRIL) 5 MG tablet Take 1 tablet (5 mg total) by mouth daily. Patient not taking: Reported on 10/09/2017 03/05/17   Park Liter P, DO  meclizine (ANTIVERT) 25 MG tablet Take 1 tablet (25 mg total) by mouth 3 (three) times daily as needed for dizziness or nausea. Patient not taking: Reported on 10/09/2017 08/20/17   Park Liter P, DO  naproxen  (NAPROSYN) 500 MG tablet Take 1 tablet (500 mg total) by mouth 2 (two) times daily with a meal. Patient not taking: Reported on 10/09/2017 03/05/17   Valerie Roys, DO     All other systems have been reviewed and were otherwise negative with the exception of those mentioned in the HPI and as above.  Physical Exam: There were no vitals filed for this visit.  General: Alert, no acute distress Cardiovascular: No pedal edema Respiratory: No cyanosis, no use of accessory musculature Skin: No lesions in the area of chief complaint Neurologic: Sensation intact distally Psychiatric: Patient is competent for  consent with normal mood and affect Lymphatic: No axillary or cervical lymphadenopathy  MUSCULOSKELETAL: + TTP low back  Assessment/Plan: Lumbar 3 compression fracture Plan for Procedure(s): LUMBAR 3 KYPHOPLASTY   Sinclair Ship, MD 10/17/2017 6:36 AM

## 2017-10-17 NOTE — Anesthesia Postprocedure Evaluation (Signed)
Anesthesia Post Note  Patient: ATHALEE ESTERLINE  Procedure(s) Performed: LUMBAR 3 KYPHOPLASTY; REQUEST 1 HOUR (N/A Spine Lumbar)     Patient location during evaluation: PACU Anesthesia Type: General Level of consciousness: awake and alert Pain management: pain level controlled Vital Signs Assessment: post-procedure vital signs reviewed and stable Respiratory status: spontaneous breathing, nonlabored ventilation, respiratory function stable and patient connected to nasal cannula oxygen Cardiovascular status: blood pressure returned to baseline and stable Postop Assessment: no apparent nausea or vomiting Anesthetic complications: no    Last Vitals:  Vitals:   10/17/17 1330 10/17/17 1345  BP:  120/68  Pulse: 71 70  Resp: 20 15  Temp:    SpO2: 100% 96%    Last Pain:  Vitals:   10/17/17 1300  TempSrc:   PainSc: Asleep                 Hiawatha Dressel,JAMES TERRILL

## 2017-10-17 NOTE — Transfer of Care (Signed)
Immediate Anesthesia Transfer of Care Note  Patient: Dawn Moore  Procedure(s) Performed: LUMBAR 3 KYPHOPLASTY; REQUEST 1 HOUR (N/A Spine Lumbar)  Patient Location: PACU  Anesthesia Type:General  Level of Consciousness: oriented, drowsy and patient cooperative  Airway & Oxygen Therapy: Patient Spontanous Breathing  Post-op Assessment: Report given to RN, Post -op Vital signs reviewed and stable and Patient moving all extremities X 4  Post vital signs: Reviewed and stable  Last Vitals:  Vitals:   10/17/17 0716 10/17/17 1227  BP: (!) 160/73   Pulse: 84 83  Resp: 20 12  Temp: 36.6 C   SpO2: 95%     Last Pain:  Vitals:   10/17/17 0716  TempSrc: Oral         Complications: No apparent anesthesia complications

## 2017-10-17 NOTE — Op Note (Signed)
NAMEURVI, IMES                 ACCOUNT NO.:  [id number]  MEDICAL RECORD NO.:  [id number]  LOCATION:  [facility]                         FACILITY:  [facility]  PHYSICIAN:  Phylliss Bob, MD      DATE OF BIRTH:  Jul 14, 1941  DATE OF PROCEDURE:  10/17/2017                              OPERATIVE REPORT   PREOPERATIVE DIAGNOSIS:  L3 compression fracture.  POSTOPERATIVE DIAGNOSIS:  L3 compression fracture.  PROCEDURE:  L3 kyphoplasty.  SURGEON:  Phylliss Bob, MD  ASSISTANT:  Pricilla Holm, PA-C.  ANESTHESIA:  General endotracheal anesthesia.  COMPLICATIONS:  None.  DISPOSITION:  Stable.  ESTIMATED BLOOD LOSS:  Minimal.  INDICATIONS FOR SURGERY:  Briefly, Ms. Chenoweth is a pleasant 76 year old female who did present to me with debilitating pain in her low back, status post a fall from a standing height.  The patient's x-rays and MRI did reveal an L3 compression fracture.  Nonoperative treatments did not result in any substantial improvement in pain, including treatment with a brace and medications.  Given the patient's ongoing pain and dysfunction, we did discuss proceeding with the procedure noted above. The patient was fully aware of the risks and limitations of surgery and did elect to proceed.  OPERATIVE DETAILS:  On October 17, 2017, the patient was brought to Surgery and general endotracheal anesthesia was administered.  The patient was placed prone on a well-padded flat Jackson bed.  Gel rolls were placed under the patient's chest and hips.  Antibiotics were given. AP and lateral fluoroscopy was brought into the field and appropriately positioned.  I then made 2 very small stab incisions at the upper and lateral aspect of the L3 pedicles.  I then cannulated the L3 pedicles in the usual manner.  I then drilled and curetted through the cannulas.  I then inserted kyphoplasty balloons bilaterally.  The balloons were inflated with approximately 3 mL of contrast at each balloon.  I  did note excellent restoration partially of the superior L3 endplate.  The balloons were then removed and cement was then introduced through the cannulas into the vertebral body at L3.  The cement was just inferior to the L3 fractured endplate.  I then used a total of approximately 9 mL of cement.  There was no extravasation of cement into the spinal canal or into the retroperitoneal space.  The cement was then allowed to harden. The cannulas were then removed.  The wound was then copiously irrigated and closed using 4-0 Monocryl.  Bacitracin and a sterile dressing were applied.  The patient was then awoken from general endotracheal anesthesia and transferred to recovery in stable condition.     Phylliss Bob, MD     MD/MEDQ  D:  10/17/2017  T:  10/17/2017  Job:  [id number]  cc:   Valerie Roys, DO

## 2017-10-18 ENCOUNTER — Encounter (HOSPITAL_COMMUNITY): Payer: Self-pay | Admitting: Orthopedic Surgery

## 2017-10-22 DIAGNOSIS — S82842D Displaced bimalleolar fracture of left lower leg, subsequent encounter for closed fracture with routine healing: Secondary | ICD-10-CM | POA: Diagnosis not present

## 2017-10-22 DIAGNOSIS — E119 Type 2 diabetes mellitus without complications: Secondary | ICD-10-CM | POA: Diagnosis not present

## 2017-10-22 DIAGNOSIS — Z7984 Long term (current) use of oral hypoglycemic drugs: Secondary | ICD-10-CM | POA: Diagnosis not present

## 2017-10-22 DIAGNOSIS — R262 Difficulty in walking, not elsewhere classified: Secondary | ICD-10-CM | POA: Diagnosis not present

## 2017-10-22 DIAGNOSIS — S32039D Unspecified fracture of third lumbar vertebra, subsequent encounter for fracture with routine healing: Secondary | ICD-10-CM | POA: Diagnosis not present

## 2017-10-23 DIAGNOSIS — Z9889 Other specified postprocedural states: Secondary | ICD-10-CM | POA: Diagnosis not present

## 2017-10-24 ENCOUNTER — Ambulatory Visit (INDEPENDENT_AMBULATORY_CARE_PROVIDER_SITE_OTHER): Payer: Medicare HMO | Admitting: Family Medicine

## 2017-10-24 ENCOUNTER — Encounter: Payer: Self-pay | Admitting: Family Medicine

## 2017-10-24 VITALS — BP 139/83 | HR 89 | Temp 98.0°F | Wt 208.5 lb

## 2017-10-24 DIAGNOSIS — E782 Mixed hyperlipidemia: Secondary | ICD-10-CM

## 2017-10-24 DIAGNOSIS — N182 Chronic kidney disease, stage 2 (mild): Secondary | ICD-10-CM | POA: Diagnosis not present

## 2017-10-24 DIAGNOSIS — F411 Generalized anxiety disorder: Secondary | ICD-10-CM

## 2017-10-24 DIAGNOSIS — N3000 Acute cystitis without hematuria: Secondary | ICD-10-CM

## 2017-10-24 DIAGNOSIS — R300 Dysuria: Secondary | ICD-10-CM

## 2017-10-24 DIAGNOSIS — Z23 Encounter for immunization: Secondary | ICD-10-CM | POA: Diagnosis not present

## 2017-10-24 DIAGNOSIS — E1122 Type 2 diabetes mellitus with diabetic chronic kidney disease: Secondary | ICD-10-CM | POA: Diagnosis not present

## 2017-10-24 DIAGNOSIS — I10 Essential (primary) hypertension: Secondary | ICD-10-CM | POA: Diagnosis not present

## 2017-10-24 MED ORDER — NITROFURANTOIN MONOHYD MACRO 100 MG PO CAPS: 100.0000 mg | ORAL_CAPSULE | Freq: Two times a day (BID) | ORAL | 0 refills | Status: DC

## 2017-10-24 NOTE — Assessment & Plan Note (Signed)
Under good control at this time with A1c of 6.0. Continue current regimen. Continue to monitor. Call with any concerns. Labs checked today and refills given.

## 2017-10-24 NOTE — Assessment & Plan Note (Signed)
Under good control at this time. Continue current regimen. Continue to monitor. Call with any concerns. Labs checked today and refills given.

## 2017-10-24 NOTE — Progress Notes (Signed)
BP 139/83 (BP Location: Left Arm, Patient Position: Sitting, Cuff Size: Large)   Pulse 89   Temp 98 F (36.7 C)   Wt 208 lb 8 oz (94.6 kg)   SpO2 93%   BMI 33.65 kg/m    Subjective:    Patient ID: Dawn Moore, female    DOB: October 27, 1941, 76 y.o.   MRN: [id number]  HPI: Dawn Moore is a 76 y.o. female  Chief Complaint  Patient presents with  . Diabetes  . Depression  . Hypertension  . Hyperlipidemia   DIABETES Hypoglycemic episodes:no Polydipsia/polyuria: no Visual disturbance: no Chest pain: no Paresthesias: no Glucose Monitoring: yes  Accucheck frequency: occasionally Taking Insulin?: no Blood Pressure Monitoring: not checking Retinal Examination: Up to Date Foot Exam: Up to Date Diabetic Education: Completed Pneumovax: Up to Date Influenza: Up to Date Aspirin: yes  HYPERTENSION / HYPERLIPIDEMIA Satisfied with current treatment? yes Duration of hypertension: chronic BP monitoring frequency: not checking BP medication side effects: no Past BP meds: lisinopril Duration of hyperlipidemia: chronic Cholesterol medication side effects: no Cholesterol supplements: none Past cholesterol medications: simvastatin (zocor) Medication compliance: good compliance Aspirin: no Recent stressors: yes Recurrent headaches: no Visual changes: no Palpitations: no Dyspnea: no Chest pain: no Lower extremity edema: no Dizzy/lightheaded: no  Having some word finding difficulty, having some more confusion  ANXIETY/DEPRESSION Duration:stable Anxious mood: yes  Excessive worrying: yes Irritability: yes  Sweating: no Nausea: no Palpitations:no Hyperventilation: no Panic attacks: yes Agoraphobia: yes  Obscessions/compulsions: yes Depressed mood: yes Depression screen [facility] 2/9 10/24/2017 07/23/2017 04/18/2017 07/24/2016 07/02/2016  Decreased Interest 3 3 3 1 2   Down, Depressed, Hopeless 3 3 3 1 2   PHQ - 2 Score 6 6 6 2 4   Altered sleeping 3 3 3  - 3  Tired, decreased  energy 3 3 3  - 2  Change in appetite 2 3 3  - 0  Feeling bad or failure about yourself  2 2 0 - 2  Trouble concentrating 3 3 3  - 2  Moving slowly or fidgety/restless 3 3 0 - 0  Suicidal thoughts 0 2 0 - 0  PHQ-9 Score 22 25 18  - 13  Difficult doing work/chores Very difficult - - - Very difficult  Some recent data might be hidden   Anhedonia: no Weight changes: no Insomnia: yes hard to fall asleep  Hypersomnia: yes Fatigue/loss of energy: yes Feelings of worthlessness: no Feelings of guilt: yes Impaired concentration/indecisiveness: yes Suicidal ideations: no  Crying spells: yes Recent Stressors/Life Changes: yes   Relationship problems: no   Family stress: yes     Financial stress: yes    Job stress: no    Recent death/loss: yes  Relevant past medical, surgical, family and social history reviewed and updated as indicated. Interim medical history since our last visit reviewed. Allergies and medications reviewed and updated.  Review of Systems  Constitutional: Negative.   Respiratory: Negative.   Cardiovascular: Negative.   Neurological: Negative.   Psychiatric/Behavioral: Positive for confusion, decreased concentration and dysphoric mood. Negative for agitation, behavioral problems, hallucinations, self-injury, sleep disturbance and suicidal ideas. The patient is nervous/anxious. The patient is not hyperactive.     Per HPI unless specifically indicated above     Objective:    BP 139/83 (BP Location: Left Arm, Patient Position: Sitting, Cuff Size: Large)   Pulse 89   Temp 98 F (36.7 C)   Wt 208 lb 8 oz (94.6 kg)   SpO2 93%   BMI 33.65 kg/m  Wt Readings from Last 3 Encounters:  10/24/17 208 lb 8 oz (94.6 kg)  10/17/17 211 lb (95.7 kg)  10/14/17 211 lb 8 oz (95.9 kg)    Physical Exam  Constitutional: She is oriented to person, place, and time. She appears well-developed and well-nourished. No distress.  HENT:  Head: Normocephalic and atraumatic.  Right Ear:  Hearing normal.  Left Ear: Hearing normal.  Nose: Nose normal.  Eyes: Conjunctivae and lids are normal. Right eye exhibits no discharge. Left eye exhibits no discharge. No scleral icterus.  Cardiovascular: Normal rate, regular rhythm, normal heart sounds and intact distal pulses.  Exam reveals no gallop and no friction rub.   No murmur heard. Pulmonary/Chest: Effort normal and breath sounds normal. No respiratory distress. She has no wheezes. She has no rales. She exhibits no tenderness.  Musculoskeletal: Normal range of motion.  Neurological: She is alert and oriented to person, place, and time.  Skin: Skin is warm, dry and intact. No rash noted. She is not diaphoretic. No erythema. No pallor.  Psychiatric: She has a normal mood and affect. Her speech is normal and behavior is normal. Judgment and thought content normal. Cognition and memory are normal.    Results for orders placed or performed in visit on 10/24/17  Microscopic Examination  Result Value Ref Range   WBC, UA 6-10 (A) 0 - 5 /hpf   RBC, UA None seen 0 - 2 /hpf   Epithelial Cells (non renal) 0-10 0 - 10 /hpf   Renal Epithel, UA 0-10 (A) None seen /hpf   Bacteria, UA Few None seen/Few  Urine Culture, Reflex  Result Value Ref Range   Urine Culture, Routine WILL FOLLOW   Bayer DCA Hb A1c Waived  Result Value Ref Range   Bayer DCA Hb A1c Waived 6.0 <7.0 %  UA/M w/rflx Culture, Routine  Result Value Ref Range   Specific Gravity, UA 1.015 1.005 - 1.030   pH, UA 7.0 5.0 - 7.5   Color, UA Yellow Yellow   Appearance Ur Turbid (A) Clear   Leukocytes, UA 2+ (A) Negative   Protein, UA 1+ (A) Negative/Trace   Glucose, UA Negative Negative   Ketones, UA Negative Negative   RBC, UA Negative Negative   Bilirubin, UA Negative Negative   Urobilinogen, Ur 1.0 0.2 - 1.0 mg/dL   Nitrite, UA Negative Negative   Microscopic Examination See below:    Urinalysis Reflex Comment       Assessment & Plan:   Problem List Items Addressed  This Visit      Cardiovascular and Mediastinum   HTN (hypertension)    Under good control at this time. Continue current regimen. Continue to monitor. Call with any concerns. Labs checked today and refills given.       Relevant Orders   Comprehensive metabolic panel     Endocrine   Diabetes mellitus with renal manifestations, controlled ([place]) - Primary    Under good control at this time with A1c of 6.0. Continue current regimen. Continue to monitor. Call with any concerns. Labs checked today and refills given.       Relevant Orders   Bayer DCA Hb A1c Waived (Completed)   Comprehensive metabolic panel     Other   Anxiety disorder    Not under great control. Anniversaries of deaths and the holidays are coming up and she is worried about it. Discussed going to see psychiatry as there is not really anything else we can do in primary care.  She doesn't want to do this right now. Still on medication from the kyphoplasty and recovering from surgery. We will leave regimen as it is for 3 months and recheck at that time- readdressing whether she would like to see psychiatry.       Relevant Orders   Comprehensive metabolic panel   Hyperlipidemia    Under good control at this time. Continue current regimen. Continue to monitor. Call with any concerns. Labs checked today and refills given.       Relevant Orders   Comprehensive metabolic panel   Lipid Panel w/o Chol/HDL Ratio    Other Visit Diagnoses    Dysuria       Will check UA- await results.    Relevant Orders   UA/M w/rflx Culture, Routine (Completed)   Immunization due       Flu shot given today.   Relevant Orders   Flu vaccine HIGH DOSE PF (Fluzone High dose) (Completed)   Immunization due       Flu shot given today.    Relevant Orders   Flu vaccine HIGH DOSE PF (Fluzone High dose) (Completed)   Acute cystitis without hematuria       Will treat with nitrofurantoin. Await culture. Call with any concerns.        Follow up  plan: Return in about 3 months (around 01/24/2018) for follow up anxiety and DM.

## 2017-10-24 NOTE — Patient Instructions (Addendum)

## 2017-10-24 NOTE — Assessment & Plan Note (Signed)
Not under great control. Anniversaries of deaths and the holidays are coming up and she is worried about it. Discussed going to see psychiatry as there is not really anything else we can do in primary care. She doesn't want to do this right now. Still on medication from the kyphoplasty and recovering from surgery. We will leave regimen as it is for 3 months and recheck at that time- readdressing whether she would like to see psychiatry.

## 2017-10-25 ENCOUNTER — Encounter: Payer: Self-pay | Admitting: Family Medicine

## 2017-10-25 DIAGNOSIS — E119 Type 2 diabetes mellitus without complications: Secondary | ICD-10-CM | POA: Diagnosis not present

## 2017-10-25 DIAGNOSIS — S32039D Unspecified fracture of third lumbar vertebra, subsequent encounter for fracture with routine healing: Secondary | ICD-10-CM | POA: Diagnosis not present

## 2017-10-25 DIAGNOSIS — Z7984 Long term (current) use of oral hypoglycemic drugs: Secondary | ICD-10-CM | POA: Diagnosis not present

## 2017-10-25 DIAGNOSIS — R262 Difficulty in walking, not elsewhere classified: Secondary | ICD-10-CM | POA: Diagnosis not present

## 2017-10-25 DIAGNOSIS — S82842D Displaced bimalleolar fracture of left lower leg, subsequent encounter for closed fracture with routine healing: Secondary | ICD-10-CM | POA: Diagnosis not present

## 2017-10-25 LAB — COMPREHENSIVE METABOLIC PANEL
ALBUMIN: 4 g/dL (ref 3.5–4.8)
ALK PHOS: 114 IU/L (ref 39–117)
ALT: 15 IU/L (ref 0–32)
AST: 40 IU/L (ref 0–40)
Albumin/Globulin Ratio: 1.3 (ref 1.2–2.2)
BILIRUBIN TOTAL: 0.8 mg/dL (ref 0.0–1.2)
BUN / CREAT RATIO: 7 — AB (ref 12–28)
BUN: 7 mg/dL — ABNORMAL LOW (ref 8–27)
CHLORIDE: 101 mmol/L (ref 96–106)
CO2: 25 mmol/L (ref 20–29)
Calcium: 9.3 mg/dL (ref 8.7–10.3)
Creatinine, Ser: 0.98 mg/dL (ref 0.57–1.00)
GFR calc Af Amer: 65 mL/min/{1.73_m2} (ref >59)
GFR calc non Af Amer: 57 mL/min/{1.73_m2} — ABNORMAL LOW (ref >59)
GLUCOSE: 106 mg/dL — AB (ref 65–99)
Globulin, Total: 3.1 g/dL (ref 1.5–4.5)
Potassium: 4.7 mmol/L (ref 3.5–5.2)
SODIUM: 142 mmol/L (ref 134–144)
Total Protein: 7.1 g/dL (ref 6.0–8.5)

## 2017-10-25 LAB — LIPID PANEL W/O CHOL/HDL RATIO
CHOLESTEROL TOTAL: 151 mg/dL (ref 100–199)
HDL: 44 mg/dL (ref >39)
LDL Calculated: 80 mg/dL (ref 0–99)
TRIGLYCERIDES: 134 mg/dL (ref 0–149)
VLDL Cholesterol Cal: 27 mg/dL (ref 5–40)

## 2017-10-26 LAB — BAYER DCA HB A1C WAIVED: HB A1C: 6 % (ref <7.0)

## 2017-10-26 LAB — MICROSCOPIC EXAMINATION: RBC, UA: NONE SEEN /hpf (ref 0–?)

## 2017-10-26 LAB — UA/M W/RFLX CULTURE, ROUTINE
Bilirubin, UA: NEGATIVE
GLUCOSE, UA: NEGATIVE
Ketones, UA: NEGATIVE
NITRITE UA: NEGATIVE
RBC, UA: NEGATIVE
SPEC GRAV UA: 1.015 (ref 1.005–1.030)
Urobilinogen, Ur: 1 mg/dL (ref 0.2–1.0)
pH, UA: 7 (ref 5.0–7.5)

## 2017-10-26 LAB — URINE CULTURE, REFLEX

## 2017-10-28 DIAGNOSIS — E119 Type 2 diabetes mellitus without complications: Secondary | ICD-10-CM | POA: Diagnosis not present

## 2017-10-28 DIAGNOSIS — R262 Difficulty in walking, not elsewhere classified: Secondary | ICD-10-CM | POA: Diagnosis not present

## 2017-10-28 DIAGNOSIS — S82842D Displaced bimalleolar fracture of left lower leg, subsequent encounter for closed fracture with routine healing: Secondary | ICD-10-CM | POA: Diagnosis not present

## 2017-10-28 DIAGNOSIS — S32039D Unspecified fracture of third lumbar vertebra, subsequent encounter for fracture with routine healing: Secondary | ICD-10-CM | POA: Diagnosis not present

## 2017-10-28 DIAGNOSIS — Z7984 Long term (current) use of oral hypoglycemic drugs: Secondary | ICD-10-CM | POA: Diagnosis not present

## 2017-11-04 ENCOUNTER — Other Ambulatory Visit: Payer: Self-pay | Admitting: Family Medicine

## 2017-11-04 NOTE — Telephone Encounter (Signed)
Unable to fill all RX

## 2017-11-05 DIAGNOSIS — Z7984 Long term (current) use of oral hypoglycemic drugs: Secondary | ICD-10-CM | POA: Diagnosis not present

## 2017-11-05 DIAGNOSIS — R262 Difficulty in walking, not elsewhere classified: Secondary | ICD-10-CM | POA: Diagnosis not present

## 2017-11-05 DIAGNOSIS — S82842D Displaced bimalleolar fracture of left lower leg, subsequent encounter for closed fracture with routine healing: Secondary | ICD-10-CM | POA: Diagnosis not present

## 2017-11-05 DIAGNOSIS — E119 Type 2 diabetes mellitus without complications: Secondary | ICD-10-CM | POA: Diagnosis not present

## 2017-11-05 DIAGNOSIS — S32039D Unspecified fracture of third lumbar vertebra, subsequent encounter for fracture with routine healing: Secondary | ICD-10-CM | POA: Diagnosis not present

## 2017-11-11 DIAGNOSIS — E119 Type 2 diabetes mellitus without complications: Secondary | ICD-10-CM | POA: Diagnosis not present

## 2017-11-11 DIAGNOSIS — S32039D Unspecified fracture of third lumbar vertebra, subsequent encounter for fracture with routine healing: Secondary | ICD-10-CM | POA: Diagnosis not present

## 2017-11-11 DIAGNOSIS — Z7984 Long term (current) use of oral hypoglycemic drugs: Secondary | ICD-10-CM | POA: Diagnosis not present

## 2017-11-11 DIAGNOSIS — S82842D Displaced bimalleolar fracture of left lower leg, subsequent encounter for closed fracture with routine healing: Secondary | ICD-10-CM | POA: Diagnosis not present

## 2017-11-11 DIAGNOSIS — R262 Difficulty in walking, not elsewhere classified: Secondary | ICD-10-CM | POA: Diagnosis not present

## 2017-11-26 ENCOUNTER — Ambulatory Visit: Payer: Medicare HMO | Admitting: Family Medicine

## 2017-11-26 ENCOUNTER — Telehealth: Payer: Self-pay | Admitting: Family Medicine

## 2017-11-26 ENCOUNTER — Encounter: Payer: Self-pay | Admitting: Family Medicine

## 2017-11-26 VITALS — BP 138/80 | HR 91 | Temp 97.4°F | Wt 216.6 lb

## 2017-11-26 DIAGNOSIS — R410 Disorientation, unspecified: Secondary | ICD-10-CM

## 2017-11-26 DIAGNOSIS — R350 Frequency of micturition: Secondary | ICD-10-CM | POA: Diagnosis not present

## 2017-11-26 DIAGNOSIS — R300 Dysuria: Secondary | ICD-10-CM | POA: Diagnosis not present

## 2017-11-26 NOTE — Telephone Encounter (Signed)
Routing to provider  

## 2017-11-26 NOTE — Telephone Encounter (Signed)
Patient stopped to pick up her 28 day script but I did not see it up front . Can you advise.  Thank you

## 2017-11-26 NOTE — Telephone Encounter (Signed)
Her 28 day is up in the folder waiting for her

## 2017-11-26 NOTE — Telephone Encounter (Signed)
Patient's daughter notified.

## 2017-11-26 NOTE — Progress Notes (Signed)
BP 138/80 (BP Location: Right Arm, Patient Position: Sitting, Cuff Size: Large)   Pulse 91   Temp (!) 97.4 F (36.3 C) (Oral)   Wt 216 lb 9.6 oz (98.2 kg)   SpO2 95%   BMI 34.96 kg/m    Subjective:    Patient ID: Dawn Moore, female    DOB: 1941/01/23, 76 y.o.   MRN: [id number]  HPI: Dawn Moore is a 76 y.o. female  Chief Complaint  Patient presents with  . Altered Mental Status    Daughter states pt gets confused when she has an UTI.    Patient here with daughter, who helps with th hx. Confusion and speech issues x 2-3 days. Pt states she doesn't notice any change but daughter does. Gets UTIs fairly frequently the past few years and they typically present this way. Denies fevers, chills, N/V, dysuria, hematuria. Does not possibly some frequency.   Of note, pt has undergone extensive Neurologic and Psychologic eval the past year or so for episodic confusion that have been thus far inconclusive. Suspected possibly that some of her medications may contribute but pt not wanting to change anything at this time. Had poor experience at Psychiatry visit last year and states she will not go back.   Past Medical History:  Diagnosis Date  . Anxiety   . Arthritis    OA knees, hands and feet  . Depression   . Diabetes mellitus without complication ([place])   . GERD (gastroesophageal reflux disease)   . Headache   . Osteopenia   . Shortness of breath dyspnea   . Sleep apnea    doesn't use CPAP  . 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 Marital status: Widowed    Spouse name: Not on file  . Number of children: Not on file  . Years of education: Not on file  . Highest education level: Not on file  Social Needs  . Financial resource strain: Not on file  . Food insecurity - worry: Not on file  . Food insecurity - inability: Not on file  . Transportation needs - medical: Not on file  . Transportation needs - non-medical: Not on  file  Occupational History  . Not on file  Tobacco Use  . Smoking status: Never Smoker  . Smokeless tobacco: Never Used  Substance and Sexual Activity  . Alcohol use: No    Alcohol/week: 0.0 oz  . Drug use: No  . Sexual activity: No    Birth control/protection: Surgical  Other Topics Concern  . Not on file  Social History Narrative  . Not on file    Relevant past medical, surgical, family and social history reviewed and updated as indicated. Interim medical history since our last visit reviewed. Allergies and medications reviewed and updated.  Review of Systems  Constitutional: Negative.   HENT: Negative.   Respiratory: Negative.   Cardiovascular: Negative.   Gastrointestinal: Negative.   Genitourinary: Positive for frequency. Negative for dysuria.  Musculoskeletal: Negative.   Neurological: Negative.   Psychiatric/Behavioral: Positive for confusion.   Per HPI unless specifically indicated above     Objective:    BP 138/80 (BP Location: Right Arm, Patient Position: Sitting, Cuff Size: Large)   Pulse 91   Temp (!) 97.4 F (36.3 C) (Oral)   Wt 216 lb 9.6 oz (98.2 kg)   SpO2 95%   BMI 34.96 kg/m   Wt Readings from Last  3 Encounters:  11/26/17 216 lb 9.6 oz (98.2 kg)  10/24/17 208 lb 8 oz (94.6 kg)  10/17/17 211 lb (95.7 kg)    Physical Exam  Constitutional: She appears well-developed and well-nourished. No distress.  HENT:  Head: Atraumatic.  Eyes: Conjunctivae are normal. Pupils are equal, round, and reactive to light.  Neck: Normal range of motion. Neck supple.  Cardiovascular: Normal rate and normal heart sounds.  Pulmonary/Chest: Effort normal and breath sounds normal. No respiratory distress.  Abdominal: Soft. Bowel sounds are normal. She exhibits no distension. There is no tenderness. There is no guarding.  Musculoskeletal: She exhibits no tenderness (no CVA tenderness b/l).  Neurological: She is alert.  Skin: Skin is warm and dry. No rash noted.    Psychiatric: She has a normal mood and affect. Her behavior is normal.  Speech organized and appropriate. Mood and affect normal today  Nursing note and vitals reviewed.   Results for orders placed or performed in visit on 11/26/17  Microscopic Examination  Result Value Ref Range   WBC, UA 11-30 (A) 0 - 5 /hpf   RBC, UA None seen 0 - 2 /hpf   Epithelial Cells (non renal) 0-10 0 - 10 /hpf   Renal Epithel, UA 0-10 (A) None seen /hpf   Bacteria, UA Few None seen/Few  Urine Culture, Reflex  Result Value Ref Range   Urine Culture, Routine WILL FOLLOW   UA/M w/rflx Culture, Routine  Result Value Ref Range   Specific Gravity, UA 1.015 1.005 - 1.030   pH, UA 5.5 5.0 - 7.5   Color, UA Yellow Yellow   Appearance Ur Hazy (A) Clear   Leukocytes, UA 1+ (A) Negative   Protein, UA Negative Negative/Trace   Glucose, UA Negative Negative   Ketones, UA Negative Negative   RBC, UA Negative Negative   Bilirubin, UA Negative Negative   Urobilinogen, Ur 0.2 0.2 - 1.0 mg/dL   Nitrite, UA Negative Negative   Microscopic Examination See below:    Urinalysis Reflex Comment       Assessment & Plan:   Problem List Items Addressed This Visit    None    Visit Diagnoses    Urinary frequency    -  Primary   Pt unable to leave specimen today, will send supplies home with her to have brought back and evaluated for UTI. Await results, will treat accordingly   Confusion       Pt not agreeable to another Psychiatrist to discuss med mgmt at this time. Will consider Geriatrics consult with Dr. Vicente Masson and let us know. Await U/A results   Relevant Orders   UA/M w/rflx Culture, Routine (Completed)   UA/M w/rflx Culture, Routine       Follow up plan: Return for as scheduled.

## 2017-11-27 ENCOUNTER — Other Ambulatory Visit: Payer: Medicare HMO

## 2017-11-27 ENCOUNTER — Telehealth: Payer: Self-pay | Admitting: Family Medicine

## 2017-11-27 DIAGNOSIS — R300 Dysuria: Secondary | ICD-10-CM

## 2017-11-27 MED ORDER — SULFAMETHOXAZOLE-TRIMETHOPRIM 800-160 MG PO TABS: 1.0000 | ORAL_TABLET | Freq: Two times a day (BID) | ORAL | 0 refills | Status: DC

## 2017-11-27 NOTE — Telephone Encounter (Signed)
U/A showed a UTI - sent in bactrim for her to start while we wait for the urine culture to come back. We will call them if we need to change the medication

## 2017-11-27 NOTE — Telephone Encounter (Signed)
Left message on machine for pt to return call to the office. CRM created.  

## 2017-11-28 NOTE — Telephone Encounter (Signed)
Message relayed to patient. Verbalized understanding and denied questions.   

## 2017-11-28 NOTE — Patient Instructions (Signed)
Follow up as needed

## 2017-12-01 LAB — UA/M W/RFLX CULTURE, ROUTINE
BILIRUBIN UA: NEGATIVE
Glucose, UA: NEGATIVE
Ketones, UA: NEGATIVE
Nitrite, UA: NEGATIVE
PH UA: 5.5 (ref 5.0–7.5)
PROTEIN UA: NEGATIVE
RBC UA: NEGATIVE
SPEC GRAV UA: 1.015 (ref 1.005–1.030)
UUROB: 0.2 mg/dL (ref 0.2–1.0)

## 2017-12-01 LAB — MICROSCOPIC EXAMINATION: RBC, UA: NONE SEEN /hpf (ref 0–?)

## 2017-12-01 LAB — URINE CULTURE, REFLEX

## 2017-12-13 ENCOUNTER — Other Ambulatory Visit: Payer: Self-pay | Admitting: Family Medicine

## 2017-12-13 MED ORDER — ALPRAZOLAM 1 MG PO TABS: 1.0000 mg | ORAL_TABLET | Freq: Three times a day (TID) | ORAL | 0 refills | Status: DC | PRN

## 2017-12-17 ENCOUNTER — Ambulatory Visit: Payer: Medicare HMO | Admitting: Family Medicine

## 2017-12-17 ENCOUNTER — Encounter: Payer: Self-pay | Admitting: Family Medicine

## 2017-12-17 VITALS — BP 136/76 | HR 85 | Temp 98.6°F | Wt 216.1 lb

## 2017-12-17 DIAGNOSIS — F332 Major depressive disorder, recurrent severe without psychotic features: Secondary | ICD-10-CM

## 2017-12-17 DIAGNOSIS — R350 Frequency of micturition: Secondary | ICD-10-CM

## 2017-12-17 DIAGNOSIS — F411 Generalized anxiety disorder: Secondary | ICD-10-CM | POA: Diagnosis not present

## 2017-12-17 DIAGNOSIS — R413 Other amnesia: Secondary | ICD-10-CM | POA: Diagnosis not present

## 2017-12-17 NOTE — Assessment & Plan Note (Signed)
Stable at this time. Will start slowly titrating off medicine to see if this leaves her mood alone but helps her memory. Following up by phone in 2 weeks and in person in 1 month. Call with any concerns.

## 2017-12-17 NOTE — Patient Instructions (Signed)
Starting tomorrow- only take gabapentin at night for 1 week, then stop gabapentin all together. I will call you in 2 weeks and see how you're doing.   After I talk to you- if you're doing OK and still having issues, we'll cut the seroquel in 1/2 for 2 weeks, then come off the seroquel- I'll see you around that time

## 2017-12-17 NOTE — Progress Notes (Signed)
BP 136/76 (BP Location: Left Arm, Patient Position: Sitting, Cuff Size: Large)   Pulse 85   Temp 98.6 F (37 C)   Wt 216 lb 2 oz (98 kg)   SpO2 96%   BMI 34.88 kg/m    Subjective:    Patient ID: Dawn Moore, female    DOB: 29-Dec-1941, 76 y.o.   MRN: [id number]  HPI: Dawn Moore is a 76 y.o. female  Chief Complaint  Patient presents with  . Urinary Tract Infection  . Memory Loss   Not having any UTI symptoms, but doesn't usually have any symptoms. Unable to leave a urine today.  Has been falling. Has been doing PT. Feels like she is doing OK. Her daughter does not feel like she is doing well. Having a lot of issues with short term memory. Remembers things from long ago, but doesn't remember things that just happened. Won't remember where they're going once they get in the car. Has seen neurology in the past, but this was over a year ago. She doesn't notice it, but her daughter is very concerned about it. Unsure if she's playing with her daughter or if she is having trouble with her memory. She notes that her mood has been doing well. She feels better than she has in months. Not having panic attacks. She is only taking her xanax a bed time to help her sleep. She is not sure if the gabapentin or the seroquel are effecting her memory. She is otherwise doing well with no other concerns or complaints at this time.   Relevant past medical, surgical, family and social history reviewed and updated as indicated. Interim medical history since our last visit reviewed. Allergies and medications reviewed and updated.  Review of Systems  Constitutional: Negative.   Respiratory: Negative.   Cardiovascular: Negative.   Genitourinary: Negative.   Psychiatric/Behavioral: Positive for confusion. Negative for agitation, behavioral problems, decreased concentration, dysphoric mood, hallucinations, self-injury, sleep disturbance and suicidal ideas. The patient is not nervous/anxious and is not  hyperactive.     Per HPI unless specifically indicated above     Objective:    BP 136/76 (BP Location: Left Arm, Patient Position: Sitting, Cuff Size: Large)   Pulse 85   Temp 98.6 F (37 C)   Wt 216 lb 2 oz (98 kg)   SpO2 96%   BMI 34.88 kg/m   Wt Readings from Last 3 Encounters:  12/17/17 216 lb 2 oz (98 kg)  11/26/17 216 lb 9.6 oz (98.2 kg)  10/24/17 208 lb 8 oz (94.6 kg)    Physical Exam  Constitutional: She is oriented to person, place, and time. She appears well-developed and well-nourished. No distress.  HENT:  Head: Normocephalic and atraumatic.  Right Ear: Hearing normal.  Left Ear: Hearing normal.  Nose: Nose normal.  Eyes: Conjunctivae and lids are normal. Right eye exhibits no discharge. Left eye exhibits no discharge. No scleral icterus.  Cardiovascular: Normal rate, regular rhythm, normal heart sounds and intact distal pulses. Exam reveals no gallop and no friction rub.  No murmur heard. Pulmonary/Chest: Effort normal and breath sounds normal. No respiratory distress. She has no wheezes. She has no rales. She exhibits no tenderness.  Musculoskeletal: Normal range of motion.  Neurological: She is alert and oriented to person, place, and time.  Skin: Skin is warm, dry and intact. No rash noted. She is not diaphoretic. No erythema. No pallor.  Psychiatric: She has a normal mood and affect. Her speech is  normal and behavior is normal. Judgment and thought content normal. Cognition and memory are normal.  Nursing note and vitals reviewed.   Results for orders placed or performed in visit on 11/26/17  Microscopic Examination  Result Value Ref Range   WBC, UA 11-30 (A) 0 - 5 /hpf   RBC, UA None seen 0 - 2 /hpf   Epithelial Cells (non renal) 0-10 0 - 10 /hpf   Renal Epithel, UA 0-10 (A) None seen /hpf   Bacteria, UA Few None seen/Few  Urine Culture, Reflex  Result Value Ref Range   Urine Culture, Routine Final report (A)    Organism ID, Bacteria Comment (A)     Antimicrobial Susceptibility Comment   UA/M w/rflx Culture, Routine  Result Value Ref Range   Specific Gravity, UA 1.015 1.005 - 1.030   pH, UA 5.5 5.0 - 7.5   Color, UA Yellow Yellow   Appearance Ur Hazy (A) Clear   Leukocytes, UA 1+ (A) Negative   Protein, UA Negative Negative/Trace   Glucose, UA Negative Negative   Ketones, UA Negative Negative   RBC, UA Negative Negative   Bilirubin, UA Negative Negative   Urobilinogen, Ur 0.2 0.2 - 1.0 mg/dL   Nitrite, UA Negative Negative   Microscopic Examination See below:    Urinalysis Reflex Comment       Assessment & Plan:   Problem List Items Addressed This Visit      Other   Anxiety disorder    Stable at this time. Will start slowly titrating off medicine to see if this leaves her mood alone but helps her memory. Following up by phone in 2 weeks and in person in 1 month. Call with any concerns.       Depression    Stable at this time. Will start slowly titrating off medicine to see if this leaves her mood alone but helps her memory. Following up by phone in 2 weeks and in person in 1 month. Call with any concerns.       Memory loss - Primary    Unclear if this is something organic or medication driven. Offered appointment with neurology, but they would like to hold off on this for now. We will start titrating off her sedating medications. Start by going to daily gabapentin for 1 week, then stop it. Then will talk by phone. If not better, will cut down on the seroquel and then will stop the xanax at night as she is not taking it during the day. Continue to monitor. Call with any concerns.       Relevant Orders   UA/M w/rflx Culture, Routine    Other Visit Diagnoses    Urinary frequency       Will check urine at home and bring it back in. Await results.        Follow up plan: Return in about 4 weeks (around 01/14/2018) for follow up memory and DM.

## 2017-12-17 NOTE — Assessment & Plan Note (Signed)
Unclear if this is something organic or medication driven. Offered appointment with neurology, but they would like to hold off on this for now. We will start titrating off her sedating medications. Start by going to daily gabapentin for 1 week, then stop it. Then will talk by phone. If not better, will cut down on the seroquel and then will stop the xanax at night as she is not taking it during the day. Continue to monitor. Call with any concerns.

## 2017-12-19 ENCOUNTER — Telehealth: Payer: Self-pay | Admitting: Family Medicine

## 2017-12-19 ENCOUNTER — Other Ambulatory Visit: Payer: Self-pay | Admitting: Family Medicine

## 2017-12-19 LAB — UA/M W/RFLX CULTURE, ROUTINE
Bilirubin, UA: NEGATIVE
Glucose, UA: NEGATIVE
Ketones, UA: NEGATIVE
LEUKOCYTES UA: NEGATIVE
NITRITE UA: NEGATIVE
PH UA: 8.5 — AB (ref 5.0–7.5)
PROTEIN UA: NEGATIVE
RBC, UA: NEGATIVE
SPEC GRAV UA: 1.015 (ref 1.005–1.030)
Urobilinogen, Ur: 1 mg/dL (ref 0.2–1.0)

## 2017-12-19 NOTE — Telephone Encounter (Signed)
Please let them know that her urine was clear this time. Thanks!

## 2017-12-20 NOTE — Telephone Encounter (Signed)
Patient notified

## 2018-01-06 ENCOUNTER — Telehealth: Payer: Self-pay | Admitting: Family Medicine

## 2018-01-06 NOTE — Telephone Encounter (Signed)
-----   Message from Valerie Roys, DO sent at 12/17/2017 10:40 AM EST ----- Call to see how she's doing off the gabapentin

## 2018-01-06 NOTE — Telephone Encounter (Signed)
Called to check in on how she's doing on the lower dose of gabapentin. Just taking 1 a day now. Doing well- feeling better. Will stop the 2nd gabapentin and recheck by phone 1-2 weeks. If doing better, but still not there, will remove seroquel.

## 2018-01-17 ENCOUNTER — Telehealth: Payer: Self-pay | Admitting: Family Medicine

## 2018-01-17 NOTE — Telephone Encounter (Signed)
-----   Message from Valerie Roys, DO sent at 01/06/2018  3:34 PM EST ----- Call about how she's doing off the gabapentin.

## 2018-01-17 NOTE — Telephone Encounter (Signed)
Called patient to check in to see how she's doing off the gabapentin. She is doing OK at this time, but still not there. More concerned about her pain from her fractures at this time. Will have her cut her seroquel in 1/2 and recheck in 1 week at her follow up

## 2018-01-24 ENCOUNTER — Ambulatory Visit (INDEPENDENT_AMBULATORY_CARE_PROVIDER_SITE_OTHER): Payer: Medicare HMO | Admitting: Family Medicine

## 2018-01-24 ENCOUNTER — Encounter: Payer: Self-pay | Admitting: Family Medicine

## 2018-01-24 VITALS — BP 121/79 | HR 85 | Temp 98.5°F | Wt 218.6 lb

## 2018-01-24 DIAGNOSIS — E1122 Type 2 diabetes mellitus with diabetic chronic kidney disease: Secondary | ICD-10-CM | POA: Diagnosis not present

## 2018-01-24 DIAGNOSIS — K219 Gastro-esophageal reflux disease without esophagitis: Secondary | ICD-10-CM

## 2018-01-24 DIAGNOSIS — R8290 Unspecified abnormal findings in urine: Secondary | ICD-10-CM | POA: Diagnosis not present

## 2018-01-24 DIAGNOSIS — F332 Major depressive disorder, recurrent severe without psychotic features: Secondary | ICD-10-CM

## 2018-01-24 DIAGNOSIS — F411 Generalized anxiety disorder: Secondary | ICD-10-CM

## 2018-01-24 DIAGNOSIS — N182 Chronic kidney disease, stage 2 (mild): Secondary | ICD-10-CM | POA: Diagnosis not present

## 2018-01-24 DIAGNOSIS — D696 Thrombocytopenia, unspecified: Secondary | ICD-10-CM | POA: Diagnosis not present

## 2018-01-24 DIAGNOSIS — E782 Mixed hyperlipidemia: Secondary | ICD-10-CM | POA: Diagnosis not present

## 2018-01-24 DIAGNOSIS — J45909 Unspecified asthma, uncomplicated: Secondary | ICD-10-CM

## 2018-01-24 DIAGNOSIS — I10 Essential (primary) hypertension: Secondary | ICD-10-CM | POA: Diagnosis not present

## 2018-01-24 LAB — BAYER DCA HB A1C WAIVED: HB A1C: 6.6 % (ref <7.0)

## 2018-01-24 LAB — LIPID PANEL PICCOLO, WAIVED
Chol/HDL Ratio Piccolo,Waive: 2.9 mg/dL
Cholesterol Piccolo, Waived: 153 mg/dL (ref <200)
HDL CHOL PICCOLO, WAIVED: 52 mg/dL — AB (ref >59)
LDL Chol Calc Piccolo Waived: 73 mg/dL (ref <100)
TRIGLYCERIDES PICCOLO,WAIVED: 138 mg/dL (ref <150)
VLDL CHOL CALC PICCOLO,WAIVE: 28 mg/dL (ref <30)

## 2018-01-24 MED ORDER — ALPRAZOLAM 1 MG PO TABS: 1.0000 mg | ORAL_TABLET | Freq: Two times a day (BID) | ORAL | 0 refills | Status: DC | PRN

## 2018-01-24 NOTE — Assessment & Plan Note (Signed)
Checking labs today. Await results.  

## 2018-01-24 NOTE — Progress Notes (Signed)
BP 121/79 (BP Location: Left Arm, Patient Position: Sitting)   Pulse 85   Temp 98.5 F (36.9 C) (Oral)   Wt 218 lb 9.6 oz (99.2 kg)   SpO2 97%   BMI 35.28 kg/m    Subjective:    Patient ID: Dawn Moore, female    DOB: 07-Apr-1941, 77 y.o.   MRN: [id number]  HPI: Dawn Moore is a 77 y.o. female  Chief Complaint  Patient presents with  . Follow-up    anxiety and DM   DIABETES Hypoglycemic episodes:no Polydipsia/polyuria: no Visual disturbance: no Chest pain: no Paresthesias: no Glucose Monitoring: no  Accucheck frequency: Not Checking  Fasting glucose: Taking Insulin?: no Blood Pressure Monitoring: not checking Retinal Examination: Not up to Date Foot Exam: Up to Date Diabetic Education: Not Completed Pneumovax: Up to Date Influenza: Up to Date Aspirin: yes  HYPERTENSION / HYPERLIPIDEMIA Satisfied with current treatment? yes Duration of hypertension: chronic BP monitoring frequency: not checking BP medication side effects: Not on anything anymore Duration of hyperlipidemia: chronic Cholesterol medication side effects: Not on anything Cholesterol supplements: none Medication compliance: not on anything Aspirin: yes Recent stressors: no Recurrent headaches: no Visual changes: no Palpitations: no Dyspnea: no Chest pain: no Lower extremity edema: no Dizzy/lightheaded: no  DEPRESSION/ANXIETY- less confused on the lower doses of medication. Now off gabapentin and had been only taking 1/2 a seroquel. Still feeling anxious, but better. Doesn't want to talk to a counselor. No other concerns.  Mood status: better Satisfied with current treatment?: yes Symptom severity: moderate  Duration of current treatment : chronic Side effects: yes- confusion Medication compliance: excellent compliance Psychotherapy/counseling: no  Previous psychiatric medications: seroquel, gabapentin, xanax, effexor Depressed mood: yes Anxious mood: yes Anhedonia: no Significant  weight loss or gain: no Insomnia: no  Fatigue: yes Feelings of worthlessness or guilt: yes Impaired concentration/indecisiveness: yes Suicidal ideations: no Hopelessness: no Crying spells: no Depression screen [facility] 2/9 01/24/2018 10/24/2017 07/23/2017 04/18/2017 07/24/2016  Decreased Interest 1 3 3 3 1   Down, Depressed, Hopeless 1 3 3 3 1   PHQ - 2 Score 2 6 6 6 2   Altered sleeping 1 3 3 3  -  Tired, decreased energy 1 3 3 3  -  Change in appetite 1 2 3 3  -  Feeling bad or failure about yourself  0 2 2 0 -  Trouble concentrating 1 3 3 3  -  Moving slowly or fidgety/restless 0 3 3 0 -  Suicidal thoughts 0 0 2 0 -  PHQ-9 Score 6 22 25 18  -  Difficult doing work/chores Somewhat difficult Very difficult - - -  Some recent data might be hidden   GAD 7 : Generalized Anxiety Score 01/24/2018 07/23/2017 02/27/2016  Nervous, Anxious, on Edge 1 3 3   Control/stop worrying 1 3 3   Worry too much - different things 1 3 3   Trouble relaxing 1 3 1   Restless 0 3 1  Easily annoyed or irritable 1 3 3   Afraid - awful might happen 1 2 2   Total GAD 7 Score 6 20 16   Anxiety Difficulty Somewhat difficult Extremely difficult Somewhat difficult    Relevant past medical, surgical, family and social history reviewed and updated as indicated. Interim medical history since our last visit reviewed. Allergies and medications reviewed and updated.  Review of Systems  Respiratory: Negative.   Cardiovascular: Negative.   Musculoskeletal: Positive for arthralgias, neck pain and neck stiffness. Negative for back pain, gait problem, joint swelling and myalgias.  Neurological: Negative.  Negative for dizziness, tremors, seizures, syncope, facial asymmetry, speech difficulty, weakness, light-headedness, numbness and headaches.  Psychiatric/Behavioral: Positive for dysphoric mood and sleep disturbance. Negative for agitation, behavioral problems, confusion, decreased concentration, hallucinations, self-injury and suicidal  ideas. The patient is nervous/anxious. The patient is not hyperactive.     Per HPI unless specifically indicated above     Objective:    BP 121/79 (BP Location: Left Arm, Patient Position: Sitting)   Pulse 85   Temp 98.5 F (36.9 C) (Oral)   Wt 218 lb 9.6 oz (99.2 kg)   SpO2 97%   BMI 35.28 kg/m   Wt Readings from Last 3 Encounters:  01/24/18 218 lb 9.6 oz (99.2 kg)  12/17/17 216 lb 2 oz (98 kg)  11/26/17 216 lb 9.6 oz (98.2 kg)    Physical Exam  Constitutional: She is oriented to person, place, and time. She appears well-developed and well-nourished. No distress.  HENT:  Head: Normocephalic and atraumatic.  Right Ear: Hearing normal.  Left Ear: Hearing normal.  Nose: Nose normal.  Eyes: Conjunctivae and lids are normal. Right eye exhibits no discharge. Left eye exhibits no discharge. No scleral icterus.  Cardiovascular: Normal rate, regular rhythm, normal heart sounds and intact distal pulses. Exam reveals no gallop and no friction rub.  No murmur heard. Pulmonary/Chest: Effort normal and breath sounds normal. No respiratory distress. She has no wheezes. She has no rales. She exhibits no tenderness.  Musculoskeletal: She exhibits no edema, tenderness or deformity.  Tenderness to vertebrae on the R of cervical spine  Neurological: She is alert and oriented to person, place, and time.  Skin: Skin is warm, dry and intact. No rash noted. She is not diaphoretic. No erythema. No pallor.  Psychiatric: She has a normal mood and affect. Her speech is normal and behavior is normal. Judgment and thought content normal. Cognition and memory are normal.  Nursing note and vitals reviewed.   Results for orders placed or performed in visit on 12/17/17  UA/M w/rflx Culture, Routine  Result Value Ref Range   Specific Gravity, UA 1.015 1.005 - 1.030   pH, UA 8.5 (H) 5.0 - 7.5   Color, UA Yellow Yellow   Appearance Ur Hazy (A) Clear   Leukocytes, UA Negative Negative   Protein, UA  Negative Negative/Trace   Glucose, UA Negative Negative   Ketones, UA Negative Negative   RBC, UA Negative Negative   Bilirubin, UA Negative Negative   Urobilinogen, Ur 1.0 0.2 - 1.0 mg/dL   Nitrite, UA Negative Negative      Assessment & Plan:   Problem List Items Addressed This Visit      Cardiovascular and Mediastinum   HTN (hypertension)    Under good control. Continue to monitor. Call with any concerns.       Relevant Orders   CBC with Differential/Platelet   Comprehensive metabolic panel   Microalbumin, Urine Waived   TSH   UA/M w/rflx Culture, Routine     Respiratory   Asthma    Under good control. Continue to monitor. Call with any concerns.       Relevant Orders   CBC with Differential/Platelet   Comprehensive metabolic panel   TSH   UA/M w/rflx Culture, Routine     Digestive   GERD (gastroesophageal reflux disease)    Under good control. Continue to monitor. Call with any concerns.       Relevant Orders   CBC with Differential/Platelet   Comprehensive metabolic panel   TSH  UA/M w/rflx Culture, Routine     Endocrine   Diabetes mellitus with renal manifestations, controlled ([place]) - Primary    Under good control with A1c of 6.6. Continue to monitor. Call with any concerns.       Relevant Orders   CBC with Differential/Platelet   Bayer DCA Hb A1c Waived   Comprehensive metabolic panel   Microalbumin, Urine Waived   TSH   UA/M w/rflx Culture, Routine     Other   Anxiety disorder    Doing better. Now off gabapentin and will stop seroquel. Goal of decreasing xanax to 1 tab daily PRN. Call with any concerns. Recheck 3 months.       Relevant Medications   ALPRAZolam (XANAX) 1 MG tablet   Other Relevant Orders   CBC with Differential/Platelet   Comprehensive metabolic panel   TSH   UA/M w/rflx Culture, Routine   Thrombocytopenia ([place])    Checking labs today. Await results.       Relevant Orders   CBC with Differential/Platelet    Comprehensive metabolic panel   TSH   UA/M w/rflx Culture, Routine   Depression    Doing better. Now off gabapentin and will stop seroquel. Goal of decreasing xanax to 1 tab daily PRN. Call with any concerns. Recheck 3 months.       Relevant Medications   ALPRAZolam (XANAX) 1 MG tablet   Other Relevant Orders   CBC with Differential/Platelet   Comprehensive metabolic panel   TSH   UA/M w/rflx Culture, Routine   Hyperlipidemia    Under good control. Continue to monitor. Call with any concerns.       Relevant Orders   CBC with Differential/Platelet   Comprehensive metabolic panel   Lipid Panel [place], Waived   TSH   UA/M w/rflx Culture, Routine       Follow up plan: Return in about 3 months (around 04/24/2018) for Follow up.

## 2018-01-24 NOTE — Assessment & Plan Note (Signed)
Doing better. Now off gabapentin and will stop seroquel. Goal of decreasing xanax to 1 tab daily PRN. Call with any concerns. Recheck 3 months.

## 2018-01-24 NOTE — Assessment & Plan Note (Signed)
Under good control. Continue to monitor. Call with any concerns.  

## 2018-01-24 NOTE — Assessment & Plan Note (Addendum)
Under good control with A1c of 6.6. Continue to monitor. Call with any concerns.

## 2018-01-25 LAB — CBC WITH DIFFERENTIAL/PLATELET
BASOS ABS: 0 10*3/uL (ref 0.0–0.2)
BASOS: 0 %
EOS (ABSOLUTE): 0 10*3/uL (ref 0.0–0.4)
Eos: 1 %
HEMOGLOBIN: 12.5 g/dL (ref 11.1–15.9)
Hematocrit: 39.2 % (ref 34.0–46.6)
IMMATURE GRANS (ABS): 0 10*3/uL (ref 0.0–0.1)
Immature Granulocytes: 0 %
Lymphocytes Absolute: 1.8 10*3/uL (ref 0.7–3.1)
Lymphs: 32 %
MCH: 26.4 pg — AB (ref 26.6–33.0)
MCHC: 31.9 g/dL (ref 31.5–35.7)
MCV: 83 fL (ref 79–97)
MONOCYTES: 6 %
Monocytes Absolute: 0.4 10*3/uL (ref 0.1–0.9)
NEUTROS ABS: 3.5 10*3/uL (ref 1.4–7.0)
Neutrophils: 61 %
Platelets: 143 10*3/uL — ABNORMAL LOW (ref 150–379)
RBC: 4.73 x10E6/uL (ref 3.77–5.28)
RDW: 16 % — ABNORMAL HIGH (ref 12.3–15.4)
WBC: 5.7 10*3/uL (ref 3.4–10.8)

## 2018-01-25 LAB — COMPREHENSIVE METABOLIC PANEL
ALT: 20 IU/L (ref 0–32)
AST: 34 IU/L (ref 0–40)
Albumin/Globulin Ratio: 1.5 (ref 1.2–2.2)
Albumin: 4.1 g/dL (ref 3.5–4.8)
Alkaline Phosphatase: 116 IU/L (ref 39–117)
BUN/Creatinine Ratio: 11 — ABNORMAL LOW (ref 12–28)
BUN: 8 mg/dL (ref 8–27)
Bilirubin Total: 0.6 mg/dL (ref 0.0–1.2)
CALCIUM: 9.3 mg/dL (ref 8.7–10.3)
CO2: 26 mmol/L (ref 20–29)
CREATININE: 0.75 mg/dL (ref 0.57–1.00)
Chloride: 101 mmol/L (ref 96–106)
GFR calc Af Amer: 90 mL/min/{1.73_m2} (ref >59)
GFR, EST NON AFRICAN AMERICAN: 78 mL/min/{1.73_m2} (ref >59)
GLUCOSE: 83 mg/dL (ref 65–99)
Globulin, Total: 2.8 g/dL (ref 1.5–4.5)
Potassium: 4.6 mmol/L (ref 3.5–5.2)
Sodium: 141 mmol/L (ref 134–144)
Total Protein: 6.9 g/dL (ref 6.0–8.5)

## 2018-01-25 LAB — TSH: TSH: 2.83 u[IU]/mL (ref 0.450–4.500)

## 2018-01-26 LAB — UA/M W/RFLX CULTURE, ROUTINE
BILIRUBIN UA: NEGATIVE
Glucose, UA: NEGATIVE
KETONES UA: NEGATIVE
NITRITE UA: NEGATIVE
Protein, UA: NEGATIVE
RBC UA: NEGATIVE
Specific Gravity, UA: 1.015 (ref 1.005–1.030)
UUROB: 0.2 mg/dL (ref 0.2–1.0)
pH, UA: 7 (ref 5.0–7.5)

## 2018-01-26 LAB — MICROSCOPIC EXAMINATION: RBC MICROSCOPIC, UA: NONE SEEN /HPF (ref 0–?)

## 2018-01-26 LAB — MICROALBUMIN, URINE WAIVED
Creatinine, Urine Waived: 200 mg/dL (ref 10–300)
Microalb, Ur Waived: 10 mg/L (ref 0–19)
Microalb/Creat Ratio: <30 mg/g (ref <30)

## 2018-01-26 LAB — URINE CULTURE, REFLEX: Organism ID, Bacteria: NO GROWTH

## 2018-01-27 ENCOUNTER — Encounter: Payer: Self-pay | Admitting: Family Medicine

## 2018-01-29 ENCOUNTER — Other Ambulatory Visit: Payer: Self-pay | Admitting: Family Medicine

## 2018-02-06 ENCOUNTER — Other Ambulatory Visit: Payer: Self-pay | Admitting: Family Medicine

## 2018-02-06 MED ORDER — ALPRAZOLAM 1 MG PO TABS: 1.0000 mg | ORAL_TABLET | Freq: Every day | ORAL | 0 refills | Status: DC | PRN

## 2018-02-26 ENCOUNTER — Other Ambulatory Visit: Payer: Self-pay | Admitting: Family Medicine

## 2018-02-26 MED ORDER — ALPRAZOLAM 1 MG PO TABS: 1.0000 mg | ORAL_TABLET | Freq: Every day | ORAL | 0 refills | Status: DC | PRN

## 2018-04-04 ENCOUNTER — Other Ambulatory Visit: Payer: Self-pay | Admitting: Family Medicine

## 2018-04-25 ENCOUNTER — Encounter: Payer: Self-pay | Admitting: Family Medicine

## 2018-04-25 ENCOUNTER — Ambulatory Visit (INDEPENDENT_AMBULATORY_CARE_PROVIDER_SITE_OTHER): Payer: Medicare HMO

## 2018-04-25 ENCOUNTER — Ambulatory Visit (INDEPENDENT_AMBULATORY_CARE_PROVIDER_SITE_OTHER): Payer: Medicare HMO | Admitting: Family Medicine

## 2018-04-25 VITALS — BP 142/90 | HR 90 | Temp 98.1°F | Ht 65.0 in | Wt 225.2 lb

## 2018-04-25 VITALS — BP 142/90 | HR 90 | Temp 98.1°F | Resp 16 | Ht 65.0 in | Wt 225.4 lb

## 2018-04-25 DIAGNOSIS — J45909 Unspecified asthma, uncomplicated: Secondary | ICD-10-CM

## 2018-04-25 DIAGNOSIS — N390 Urinary tract infection, site not specified: Secondary | ICD-10-CM

## 2018-04-25 DIAGNOSIS — F332 Major depressive disorder, recurrent severe without psychotic features: Secondary | ICD-10-CM | POA: Diagnosis not present

## 2018-04-25 DIAGNOSIS — S51802A Unspecified open wound of left forearm, initial encounter: Secondary | ICD-10-CM | POA: Diagnosis not present

## 2018-04-25 DIAGNOSIS — Z0001 Encounter for general adult medical examination with abnormal findings: Secondary | ICD-10-CM | POA: Diagnosis not present

## 2018-04-25 DIAGNOSIS — C44722 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, including hip: Secondary | ICD-10-CM | POA: Diagnosis not present

## 2018-04-25 DIAGNOSIS — E782 Mixed hyperlipidemia: Secondary | ICD-10-CM

## 2018-04-25 DIAGNOSIS — E1142 Type 2 diabetes mellitus with diabetic polyneuropathy: Secondary | ICD-10-CM | POA: Diagnosis not present

## 2018-04-25 DIAGNOSIS — K219 Gastro-esophageal reflux disease without esophagitis: Secondary | ICD-10-CM

## 2018-04-25 DIAGNOSIS — Z0000 Encounter for general adult medical examination without abnormal findings: Secondary | ICD-10-CM

## 2018-04-25 DIAGNOSIS — E1122 Type 2 diabetes mellitus with diabetic chronic kidney disease: Secondary | ICD-10-CM

## 2018-04-25 DIAGNOSIS — N182 Chronic kidney disease, stage 2 (mild): Secondary | ICD-10-CM

## 2018-04-25 DIAGNOSIS — M8580 Other specified disorders of bone density and structure, unspecified site: Secondary | ICD-10-CM

## 2018-04-25 DIAGNOSIS — E1342 Other specified diabetes mellitus with diabetic polyneuropathy: Secondary | ICD-10-CM

## 2018-04-25 DIAGNOSIS — F411 Generalized anxiety disorder: Secondary | ICD-10-CM

## 2018-04-25 DIAGNOSIS — I10 Essential (primary) hypertension: Secondary | ICD-10-CM

## 2018-04-25 DIAGNOSIS — D696 Thrombocytopenia, unspecified: Secondary | ICD-10-CM

## 2018-04-25 MED ORDER — SIMVASTATIN 10 MG PO TABS: 10.0000 mg | ORAL_TABLET | Freq: Every evening | ORAL | 1 refills | Status: DC

## 2018-04-25 MED ORDER — ALPRAZOLAM 1 MG PO TABS: 1.0000 mg | ORAL_TABLET | Freq: Two times a day (BID) | ORAL | 3 refills | Status: DC | PRN

## 2018-04-25 MED ORDER — VENLAFAXINE HCL ER 75 MG PO CP24: ORAL_CAPSULE | ORAL | 1 refills | Status: DC

## 2018-04-25 MED ORDER — NYSTATIN 100000 UNIT/GM EX POWD: Freq: Four times a day (QID) | CUTANEOUS | 1 refills | Status: DC

## 2018-04-25 MED ORDER — CLOTRIMAZOLE 1 % EX CREA: 1.0000 "application " | TOPICAL_CREAM | Freq: Two times a day (BID) | CUTANEOUS | 3 refills | Status: DC

## 2018-04-25 MED ORDER — QUETIAPINE FUMARATE 25 MG PO TABS: 12.5000 mg | ORAL_TABLET | Freq: Every day | ORAL | 1 refills | Status: DC

## 2018-04-25 MED ORDER — FLUTICASONE PROPIONATE 50 MCG/ACT NA SUSP: 2.0000 | Freq: Every day | NASAL | 3 refills | Status: DC

## 2018-04-25 MED ORDER — METFORMIN HCL 500 MG PO TABS: ORAL_TABLET | ORAL | 1 refills | Status: DC

## 2018-04-25 MED ORDER — VENLAFAXINE HCL ER 150 MG PO CP24: 150.0000 mg | ORAL_CAPSULE | Freq: Every day | ORAL | 1 refills | Status: DC

## 2018-04-25 MED ORDER — OMEPRAZOLE 20 MG PO CPDR: 20.0000 mg | DELAYED_RELEASE_CAPSULE | Freq: Every day | ORAL | 1 refills | Status: DC

## 2018-04-25 NOTE — Assessment & Plan Note (Signed)
Not doing great. Will restart 12.5mg  of seroquel at night. Continue current regimen. Continue to monitor.

## 2018-04-25 NOTE — Assessment & Plan Note (Signed)
Has been removed- but given history, will get her back in to see dermatology. Referral generated today.

## 2018-04-25 NOTE — Patient Instructions (Signed)
Dawn Moore , Thank you for taking time to come for your Medicare Wellness Visit. I appreciate your ongoing commitment to your health goals. Please review the following plan we discussed and let me know if I can assist you in the future.   Screening recommendations/referrals: Colonoscopy: completed 10/18/2011 Mammogram: no longer required Bone Density: completed 03/15/2016 Recommended yearly ophthalmology/optometry visit for glaucoma screening and checkup Recommended yearly dental visit for hygiene and checkup  Vaccinations: Influenza vaccine: up to date  Pneumococcal vaccine: up to date  Tdap vaccine: up to date  Shingles vaccine: shingrix eligible, check with your insurance company for coverage     Advanced directives: Advance directive discussed with you today. I have provided a copy for you to complete at home and have notarized. Once this is complete please bring a copy in to our office so we can scan it into your chart.  Conditions/risks identified: Recommend drinking at least 6-8 glasses of water a day   Next appointment: Follow up in one year for your annual wellness exam.    Preventive Care 65 Years and Older, Female Preventive care refers to lifestyle choices and visits with your health care provider that can promote health and wellness. What does preventive care include?  A yearly physical exam. This is also called an annual well check.  Dental exams once or twice a year.  Routine eye exams. Ask your health care provider how often you should have your eyes checked.  Personal lifestyle choices, including:  Daily care of your teeth and gums.  Regular physical activity.  Eating a healthy diet.  Avoiding tobacco and drug use.  Limiting alcohol use.  Practicing safe sex.  Taking low-dose aspirin every day.  Taking vitamin and mineral supplements as recommended by your health care provider. What happens during an annual well check? The services and screenings  done by your health care provider during your annual well check will depend on your age, overall health, lifestyle risk factors, and family history of disease. Counseling  Your health care provider may ask you questions about your:  Alcohol use.  Tobacco use.  Drug use.  Emotional well-being.  Home and relationship well-being.  Sexual activity.  Eating habits.  History of falls.  Memory and ability to understand (cognition).  Work and work Statistician.  Reproductive health. Screening  You may have the following tests or measurements:  Height, weight, and BMI.  Blood pressure.  Lipid and cholesterol levels. These may be checked every 5 years, or more frequently if you are over 65 years old.  Skin check.  Lung cancer screening. You may have this screening every year starting at age 51 if you have a 30-pack-year history of smoking and currently smoke or have quit within the past 15 years.  Fecal occult blood test (FOBT) of the stool. You may have this test every year starting at age 42.  Flexible sigmoidoscopy or colonoscopy. You may have a sigmoidoscopy every 5 years or a colonoscopy every 10 years starting at age 50.  Hepatitis C blood test.  Hepatitis B blood test.  Sexually transmitted disease (STD) testing.  Diabetes screening. This is done by checking your blood sugar (glucose) after you have not eaten for a while (fasting). You may have this done every 1-3 years.  Bone density scan. This is done to screen for osteoporosis. You may have this done starting at age 53.  Mammogram. This may be done every 1-2 years. Talk to your health care provider about how  often you should have regular mammograms. Talk with your health care provider about your test results, treatment options, and if necessary, the need for more tests. Vaccines  Your health care provider may recommend certain vaccines, such as:  Influenza vaccine. This is recommended every year.  Tetanus,  diphtheria, and acellular pertussis (Tdap, Td) vaccine. You may need a Td booster every 10 years.  Zoster vaccine. You may need this after age 82.  Pneumococcal 13-valent conjugate (PCV13) vaccine. One dose is recommended after age 1.  Pneumococcal polysaccharide (PPSV23) vaccine. One dose is recommended after age 79. Talk to your health care provider about which screenings and vaccines you need and how often you need them. This information is not intended to replace advice given to you by your health care provider. Make sure you discuss any questions you have with your health care provider. Document Released: 01/13/2016 Document Revised: 09/05/2016 Document Reviewed: 10/18/2015 Elsevier Interactive Patient Education  2017 [place] Prevention in the Home Falls can cause injuries. They can happen to people of all ages. There are many things you can do to make your home safe and to help prevent falls. What can I do on the outside of my home?  Regularly fix the edges of walkways and driveways and fix any cracks.  Remove anything that might make you trip as you walk through a door, such as a raised step or threshold.  Trim any bushes or trees on the path to your home.  Use bright outdoor lighting.  Clear any walking paths of anything that might make someone trip, such as rocks or tools.  Regularly check to see if handrails are loose or broken. Make sure that both sides of any steps have handrails.  Any raised decks and porches should have guardrails on the edges.  Have any leaves, snow, or ice cleared regularly.  Use sand or salt on walking paths during winter.  Clean up any spills in your garage right away. This includes oil or grease spills. What can I do in the bathroom?  Use night lights.  Install grab bars by the toilet and in the tub and shower. Do not use towel bars as grab bars.  Use non-skid mats or decals in the tub or shower.  If you need to sit down in  the shower, use a plastic, non-slip stool.  Keep the floor dry. Clean up any water that spills on the floor as soon as it happens.  Remove soap buildup in the tub or shower regularly.  Attach bath mats securely with double-sided non-slip rug tape.  Do not have throw rugs and other things on the floor that can make you trip. What can I do in the bedroom?  Use night lights.  Make sure that you have a light by your bed that is easy to reach.  Do not use any sheets or blankets that are too big for your bed. They should not hang down onto the floor.  Have a firm chair that has side arms. You can use this for support while you get dressed.  Do not have throw rugs and other things on the floor that can make you trip. What can I do in the kitchen?  Clean up any spills right away.  Avoid walking on wet floors.  Keep items that you use a lot in easy-to-reach places.  If you need to reach something above you, use a strong step stool that has a grab bar.  Keep electrical  cords out of the way.  Do not use floor polish or wax that makes floors slippery. If you must use wax, use non-skid floor wax.  Do not have throw rugs and other things on the floor that can make you trip. What can I do with my stairs?  Do not leave any items on the stairs.  Make sure that there are handrails on both sides of the stairs and use them. Fix handrails that are broken or loose. Make sure that handrails are as long as the stairways.  Check any carpeting to make sure that it is firmly attached to the stairs. Fix any carpet that is loose or worn.  Avoid having throw rugs at the top or bottom of the stairs. If you do have throw rugs, attach them to the floor with carpet tape.  Make sure that you have a light switch at the top of the stairs and the bottom of the stairs. If you do not have them, ask someone to add them for you. What else can I do to help prevent falls?  Wear shoes that:  Do not have high  heels.  Have rubber bottoms.  Are comfortable and fit you well.  Are closed at the toe. Do not wear sandals.  If you use a stepladder:  Make sure that it is fully opened. Do not climb a closed stepladder.  Make sure that both sides of the stepladder are locked into place.  Ask someone to hold it for you, if possible.  Clearly mark and make sure that you can see:  Any grab bars or handrails.  First and last steps.  Where the edge of each step is.  Use tools that help you move around (mobility aids) if they are needed. These include:  Canes.  Walkers.  Scooters.  Crutches.  Turn on the lights when you go into a dark area. Replace any light bulbs as soon as they burn out.  Set up your furniture so you have a clear path. Avoid moving your furniture around.  If any of your floors are uneven, fix them.  If there are any pets around you, be aware of where they are.  Review your medicines with your doctor. Some medicines can make you feel dizzy. This can increase your chance of falling. Ask your doctor what other things that you can do to help prevent falls. This information is not intended to replace advice given to you by your health care provider. Make sure you discuss any questions you have with your health care provider. Document Released: 10/13/2009 Document Revised: 05/24/2016 Document Reviewed: 01/21/2015 Elsevier Interactive Patient Education  2017 [employer].

## 2018-04-25 NOTE — Assessment & Plan Note (Signed)
Under good control. Continue to monitor. Continue diet and exercise. Call with any concerns.

## 2018-04-25 NOTE — Assessment & Plan Note (Signed)
Stable off medicines. Continue to monitor. Call with any concerns.

## 2018-04-25 NOTE — Progress Notes (Signed)
Subjective:   Dawn Moore is a 77 y.o. female who presents for Medicare Annual (Subsequent) preventive examination.   Review of Systems:   Cardiac Risk Factors include: hypertension;advanced age (>9men, >35 women);obesity (BMI >30kg/m2);dyslipidemia;diabetes mellitus     Objective:     Vitals: BP (!) 142/90 (BP Location: Left Arm, Patient Position: Sitting)   Pulse 90   Temp 98.1 F (36.7 C) (Temporal)   Resp 16   Ht 5\' 5"  (1.651 m)   Wt 225 lb 6.4 oz (102.2 kg)   SpO2 94%   BMI 37.51 kg/m   Body mass index is 37.51 kg/m.  Advanced Directives 04/25/2018 10/14/2017 07/28/2017 07/11/2017 04/18/2017 03/28/2016 03/07/2016  Does Patient Have a Medical Advance Directive? No No No No No No No  Would patient like information on creating a medical advance directive? Yes (MAU/Ambulatory/Procedural Areas - Information given) No - Patient declined - - Yes (MAU/Ambulatory/Procedural Areas - Information given) No - patient declined information No - patient declined information    Tobacco Social History   Tobacco Use  Smoking Status Never Smoker  Smokeless Tobacco Never Used     Counseling given: Not Answered   Clinical Intake:  Pre-visit preparation completed: Yes  Pain : 0-10 Pain Score: 3  Pain Type: Chronic pain Pain Location: Back(knees and ankles ) Pain Orientation: Lower Pain Descriptors / Indicators: Aching Pain Onset: More than a month ago Pain Frequency: Constant     Nutritional Status: BMI > 30  Obese Nutritional Risks: None Diabetes: Yes CBG done?: No Did pt. bring in CBG monitor from home?: No  How often do you need to have someone help you when you read instructions, pamphlets, or other written materials from your doctor or pharmacy?: 1 - Never What is the last grade level you completed in school?: 12th grade  Interpreter Needed?: No  Information entered by :: Daylyn Azbill,LPN   Past Medical History:  Diagnosis Date  . Anxiety   . Arthritis    OA  knees, hands and feet  . Depression   . Diabetes mellitus without complication ([place])   . GERD (gastroesophageal reflux disease)   . Headache   . Osteopenia   . Shortness of breath dyspnea   . Sleep apnea    doesn't use CPAP  . 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 ABDOMINAL HYSTERECTOMY  1986   still has one ovary  . CATARACT EXTRACTION W/PHACO Left 03/07/2016   Procedure: CATARACT EXTRACTION PHACO AND INTRAOCULAR LENS PLACEMENT (IOC);  Surgeon: Leandrew Koyanagi, MD;  Location: [facility];  Service: Ophthalmology;  Laterality: Left;  DIABETIC -oral meds Sleep apnea  . CATARACT EXTRACTION W/PHACO Right 03/28/2016   Procedure: CATARACT EXTRACTION PHACO AND INTRAOCULAR LENS PLACEMENT (IOC);  Surgeon: Leandrew Koyanagi, MD;  Location: [facility];  Service: Ophthalmology;  Laterality: Right;  DIABETIC - oral meds  . CHOLECYSTECTOMY  1999  . COCHLEAR IMPLANT    . FOOT FRACTURE SURGERY    . HEEL SPUR SURGERY Left 11/04/09   Revision 01/2011  . INTRAOCULAR LENS INSERTION Bilateral   . KYPHOPLASTY N/A 10/17/2017   Procedure: LUMBAR 3 KYPHOPLASTY; REQUEST 1 HOUR;  Surgeon: Phylliss Bob, MD;  Location: [facility];  Service: Orthopedics;  Laterality: N/A;  LUMBAR 3 KYPHOPLASTY; REQUEST 1 HOUR   Family History  Problem Relation Age of Onset  . Stroke Mother   . Dementia Mother   . Cancer Father  prostate  . Skin cancer Daughter   . Hyperlipidemia Son   . Hypertension Son   . Mental illness Son   . Alcohol abuse Son   . Arthritis Sister        rheumatoid  . Cancer Sister        breast  . Thyroid disease Sister   . Cancer Sister        thyroid  . Cancer Brother        prostate  . Arthritis Maternal Grandmother    Social History   Socioeconomic History  . Marital status: Widowed    Spouse name: Not on file  . Number of children: Not on file  . Years of education: Not on file  . Highest  education level: Not on file  Occupational History  . Not on file  Social Needs  . Financial resource strain: Not hard at all  . Food insecurity:    Worry: Never true    Inability: Never true  . Transportation needs:    Medical: No    Non-medical: No  Tobacco Use  . Smoking status: Never Smoker  . Smokeless tobacco: Never Used  Substance and Sexual Activity  . Alcohol use: No    Alcohol/week: 0.0 oz  . Drug use: No  . Sexual activity: Never    Birth control/protection: Surgical  Lifestyle  . Physical activity:    Days per week: 0 days    Minutes per session: 0 min  . Stress: Not at all  Relationships  . Social connections:    Talks on phone: More than three times a week    Gets together: More than three times a week    Attends religious service: Never    Active member of club or organization: No    Attends meetings of clubs or organizations: Never    Relationship status: Widowed  Other Topics Concern  . Not on file  Social History Narrative  . Not on file    Outpatient Encounter Medications as of 04/25/2018  Medication Sig  . ALPRAZolam (XANAX) 1 MG tablet Take 1 tablet (1 mg total) by mouth daily as needed for anxiety.  . calcitonin, salmon, (MIACALCIN/FORTICAL) 200 UNIT/ACT nasal spray Place 1 spray into alternate nostrils daily.  . Cholecalciferol (VITAMIN D-3) 5000 units TABS Take 5,000 Units by mouth daily.  . metFORMIN (GLUCOPHAGE) 500 MG tablet TAKE 1 TABLET EVERY DAY WITH BREAKFAST  . nystatin (MYCOSTATIN/NYSTOP) powder Apply topically 4 (four) times daily.  Marland Kitchen omeprazole (PRILOSEC) 20 MG capsule TAKE 1 CAPSULE EVERY DAY  . simvastatin (ZOCOR) 10 MG tablet TAKE 1 TABLET EVERY EVENING  . venlafaxine XR (EFFEXOR-XR) 150 MG 24 hr capsule TAKE 1 CAPSULE EVERY DAY (FOR TOTAL OF 225MG  PER DAY)  . venlafaxine XR (EFFEXOR-XR) 75 MG 24 hr capsule TAKE 1 CAPSULE DAILY WITH BREAKFAST (TOTAL DAILY DOSE IS 225MG )  . [DISCONTINUED] nystatin ointment (MYCOSTATIN) Apply 1  application topically 2 (two) times daily. (Patient not taking: Reported on 04/25/2018)  . [DISCONTINUED] oxyCODONE-acetaminophen (PERCOCET/ROXICET) 5-325 MG tablet    No facility-administered encounter medications on file as of 04/25/2018.     Activities of Daily Living In your present state of health, do you have any difficulty performing the following activities: 04/25/2018 10/14/2017  Hearing? Y N  Vision? Y N  Difficulty concentrating or making decisions? Y N  Walking or climbing stairs? Y N  Dressing or bathing? N N  Doing errands, shopping? Y -  Conservation officer, nature and eating ?  N -  Using the Toilet? N -  In the past six months, have you accidently leaked urine? N -  Do you have problems with loss of bowel control? N -  Managing your Medications? Y -  Comment daughter assists  -  Managing your Finances? N -  Housekeeping or managing your Housekeeping? N -  Some recent data might be hidden    Patient Care Team: Valerie Roys, DO as PCP - General (Family Medicine)    Assessment:   This is a routine wellness examination for [place].  Exercise Activities and Dietary recommendations Current Exercise Habits: The patient does not participate in regular exercise at present, Exercise limited by: orthopedic condition(s)  Goals    . DIET - INCREASE WATER INTAKE     Recommend drinking at least 6-8 glasses of water a day        Fall Risk Fall Risk  04/25/2018 07/23/2017 04/18/2017 08/06/2016 06/15/2016  Falls in the past year? Yes Yes Yes Yes Yes  Number falls in past yr: 2 or more 2 or more 2 or more 2 or more 2 or more  Injury with Fall? Yes Yes Yes Yes -  Risk Factor Category  High Fall Risk High Fall Risk - - -  Follow up Falls prevention discussed;Falls evaluation completed - - Falls evaluation completed -   Is the patient's home free of loose throw rugs in walkways, pet beds, electrical cords, etc?   yes      Grab bars in the bathroom? no      Handrails on the stairs?   no       Adequate lighting?   yes  Timed Get Up and Go performed: Completed in 8 seconds with no use of assistive devices, steady gait. No intervention needed at this time.   Depression Screen PHQ 2/9 Scores 04/25/2018 01/24/2018 10/24/2017 07/23/2017  PHQ - 2 Score 5 2 6 6   PHQ- 9 Score 15 6 22 25      Cognitive Function MMSE - Mini Mental State Exam 08/06/2016  Orientation to time 4  Orientation to Place 4  Registration 3  Attention/ Calculation 5  Recall 2  Language- name 2 objects 2  Language- repeat 1  Language- follow 3 step command 2  Language- read & follow direction 1  Write a sentence 1  Copy design 1  Total score 26     6CIT Screen 04/25/2018 04/18/2017  What Year? 0 points 0 points  What month? 0 points 0 points  What time? 0 points 0 points  Count back from 20 0 points 0 points  Months in reverse 0 points 0 points  Repeat phrase 0 points 0 points  Total Score 0 0    Immunization History  Administered Date(s) Administered  . Influenza, High Dose Seasonal PF 12/14/2016, 10/24/2017  . Influenza,inj,Quad PF,6+ Mos 09/23/2015  . Influenza-Unspecified 10/08/2013  . Pneumococcal Conjugate-13 09/23/2015  . Pneumococcal Polysaccharide-23 09/24/2008, 04/18/2017  . Td 12/31/2005  . Tdap 07/24/2016  . Zoster 09/24/2008    Qualifies for Shingles Vaccine?yes, discussed shingrix vaccine   Screening Tests Health Maintenance  Topic Date Due  . OPHTHALMOLOGY EXAM  12/04/2017  . HEMOGLOBIN A1C  07/24/2018  . INFLUENZA VACCINE  07/31/2018  . FOOT EXAM  01/24/2019  . URINE MICROALBUMIN  01/24/2019  . COLONOSCOPY  10/17/2021  . TETANUS/TDAP  07/24/2026  . DEXA SCAN  Completed  . PNA vac Low Risk Adult  Completed    Cancer Screenings: Lung: Low  Dose CT Chest recommended if Age 90-80 years, 30 pack-year currently smoking OR have quit w/in 15years. Patient does not qualify. Breast:  Up to date on Mammogram? Yes  No longer required Up to date of Bone Density/Dexa? Yes  03/15/2016 Colorectal: completed 10/18/2011  Additional Screenings:  Hepatitis C Screening: not indicated      Plan:    I have personally reviewed and addressed the Medicare Annual Wellness questionnaire and have noted the following in the patient's chart:  A. Medical and social history B. Use of alcohol, tobacco or illicit drugs  C. Current medications and supplements D. Functional ability and status E.  Nutritional status F.  Physical activity G. Advance directives H. List of other physicians I.  Hospitalizations, surgeries, and ER visits in previous 12 months J.  [place] such as hearing and vision if needed, cognitive and depression L. Referrals and appointments   In addition, I have reviewed and discussed with patient certain preventive protocols, quality metrics, and best practice recommendations. A written personalized care plan for preventive services as well as general preventive health recommendations were provided to patient.   Signed,  Tyler Aas, LPN Nurse Health Advisor   Nurse Notes:none

## 2018-04-25 NOTE — Assessment & Plan Note (Signed)
Under good control. Continue current regimen. Continue to monitor. Call with any concerns. 

## 2018-04-25 NOTE — Progress Notes (Signed)
BP (!) 142/90   Pulse 90   Temp 98.1 F (36.7 C)   Ht 5\' 5"  (1.651 m)   Wt 225 lb 4 oz (102.2 kg)   SpO2 94%   BMI 37.48 kg/m    Subjective:    Patient ID: Dawn Moore, female    DOB: Apr 21, 1941, 77 y.o.   MRN: [id number]  HPI: Dawn Moore is a 77 y.o. female presenting on 04/25/2018 for comprehensive medical examination. Current medical complaints include:  ANXIETY/STRESS- has been worse Duration:exacerbated Anxious mood: yes  Excessive worrying: yes Irritability: yes  Sweating: no Nausea: no Palpitations:no Hyperventilation: no Panic attacks: no Agoraphobia: no  Obscessions/compulsions: no Depressed mood: yes Depression screen [facility] 2/9 04/25/2018 01/24/2018 10/24/2017 07/23/2017 04/18/2017  Decreased Interest 2 1 3 3 3   Down, Depressed, Hopeless 3 1 3 3 3   PHQ - 2 Score 5 2 6 6 6   Altered sleeping 3 1 3 3 3   Tired, decreased energy 2 1 3 3 3   Change in appetite 1 1 2 3 3   Feeling bad or failure about yourself  2 0 2 2 0  Trouble concentrating 2 1 3 3 3   Moving slowly or fidgety/restless 0 0 3 3 0  Suicidal thoughts 0 0 0 2 0  PHQ-9 Score 15 6 22 25 18   Difficult doing work/chores Somewhat difficult Somewhat difficult Very difficult - -  Some recent data might be hidden   Anhedonia: no Weight changes: no Insomnia: yes hard to stay asleep  Hypersomnia: no Fatigue/loss of energy: yes Feelings of worthlessness: no Feelings of guilt: yes Impaired concentration/indecisiveness: no Suicidal ideations: no  Crying spells: no Recent Stressors/Life Changes: yes   Relationship problems: yes   Family stress: yes     Financial stress: no    Job stress: no    Recent death/loss: no  HYPERTENSION / HYPERLIPIDEMIA Satisfied with current treatment? yes  Duration of hypertension: chronic BP monitoring frequency: not checking Duration of hyperlipidemia: chronic Cholesterol medication side effects: no Cholesterol supplements: none Past cholesterol medications:  simvastatin (zocor) Medication compliance: excellent compliance Aspirin: no Recent stressors: yes Recurrent headaches: no Visual changes: no Palpitations: no Dyspnea: no Chest pain: no Lower extremity edema: no Dizzy/lightheaded: no  DIABETES Hypoglycemic episodes:no Polydipsia/polyuria: no Visual disturbance: no Chest pain: no Paresthesias: no Glucose Monitoring: yes  Accucheck frequency: Not Checking Taking Insulin?: no Blood Pressure Monitoring: not checking Retinal Examination: Not up to Date Foot Exam: Up to Date Diabetic Education: Completed Pneumovax: Up to Date Influenza: Up to Date Aspirin: no  She currently lives with: daughter Menopausal Symptoms: no  Depression Screen done today and results listed below:  Depression screen [facility] 2/9 04/25/2018 01/24/2018 10/24/2017 07/23/2017 04/18/2017  Decreased Interest 2 1 3 3 3   Down, Depressed, Hopeless 3 1 3 3 3   PHQ - 2 Score 5 2 6 6 6   Altered sleeping 3 1 3 3 3   Tired, decreased energy 2 1 3 3 3   Change in appetite 1 1 2 3 3   Feeling bad or failure about yourself  2 0 2 2 0  Trouble concentrating 2 1 3 3 3   Moving slowly or fidgety/restless 0 0 3 3 0  Suicidal thoughts 0 0 0 2 0  PHQ-9 Score 15 6 22 25 18   Difficult doing work/chores Somewhat difficult Somewhat difficult Very difficult - -  Some recent data might be hidden   GAD 7 : Generalized Anxiety Score 01/24/2018 07/23/2017 02/27/2016  Nervous, Anxious, on Edge  1 3 3   Control/stop worrying 1 3 3   Worry too much - different things 1 3 3   Trouble relaxing 1 3 1   Restless 0 3 1  Easily annoyed or irritable 1 3 3   Afraid - awful might happen 1 2 2   Total GAD 7 Score 6 20 16   Anxiety Difficulty Somewhat difficult Extremely difficult Somewhat difficult    Past Medical History:  Past Medical History:  Diagnosis Date  . Anxiety   . Arthritis    OA knees, hands and feet  . Depression   . Diabetes mellitus without complication ([place])   . GERD  (gastroesophageal reflux disease)   . Headache   . Osteopenia   . Shortness of breath dyspnea   . Sleep apnea    doesn't use CPAP  . 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 ABDOMINAL HYSTERECTOMY  1986   still has one ovary  . CATARACT EXTRACTION W/PHACO Left 03/07/2016   Procedure: CATARACT EXTRACTION PHACO AND INTRAOCULAR LENS PLACEMENT (IOC);  Surgeon: Leandrew Koyanagi, MD;  Location: [place];  Service: Ophthalmology;  Laterality: Left;  DIABETIC -oral meds Sleep apnea  . CATARACT EXTRACTION W/PHACO Right 03/28/2016   Procedure: CATARACT EXTRACTION PHACO AND INTRAOCULAR LENS PLACEMENT (IOC);  Surgeon: Leandrew Koyanagi, MD;  Location: [place];  Service: Ophthalmology;  Laterality: Right;  DIABETIC - oral meds  . CHOLECYSTECTOMY  1999  . COCHLEAR IMPLANT    . FOOT FRACTURE SURGERY    . HEEL SPUR SURGERY Left 11/04/09   Revision 01/2011  . INTRAOCULAR LENS INSERTION Bilateral   . KYPHOPLASTY N/A 10/17/2017   Procedure: LUMBAR 3 KYPHOPLASTY; REQUEST 1 HOUR;  Surgeon: Phylliss Bob, MD;  Location: [place];  Service: Orthopedics;  Laterality: N/A;  LUMBAR 3 KYPHOPLASTY; REQUEST 1 HOUR    Medications:  Current Outpatient Medications on File Prior to Visit  Medication Sig  . ACCU-CHEK AVIVA PLUS test strip   . ACCU-CHEK SOFTCLIX LANCETS lancets   . B Complex-Biotin-FA (SUPER QUINTS B-50) TABS Take by mouth.  . calcitonin, salmon, (MIACALCIN/FORTICAL) 200 UNIT/ACT nasal spray Place 1 spray into alternate nostrils daily.  . Cholecalciferol (VITAMIN D-3) 5000 units TABS Take 5,000 Units by mouth daily.   No current facility-administered medications on file prior to visit.     Allergies:  Allergies  Allergen Reactions  . Nsaids Other (See Comments)    Per Hematology  . Wellbutrin [Bupropion] Other (See Comments)    Thoughts that she would die    Social History:  Social  History   Socioeconomic History  . Marital status: Widowed    Spouse name: Not on file  . Number of children: Not on file  . Years of education: Not on file  . Highest education level: Not on file  Occupational History  . Not on file  Social Needs  . Financial resource strain: Not hard at all  . Food insecurity:    Worry: Never true    Inability: Never true  . Transportation needs:    Medical: No    Non-medical: No  Tobacco Use  . Smoking status: Never Smoker  . Smokeless tobacco: Never Used  Substance and Sexual Activity  . Alcohol use: No    Alcohol/week: 0.0 oz  . Drug use: No  . Sexual activity: Never    Birth control/protection: Surgical  Lifestyle  . Physical activity:    Days per  week: 0 days    Minutes per session: 0 min  . Stress: Not at all  Relationships  . Social connections:    Talks on phone: More than three times a week    Gets together: More than three times a week    Attends religious service: Never    Active member of club or organization: No    Attends meetings of clubs or organizations: Never    Relationship status: Widowed  . Intimate partner violence:    Fear of current or ex partner: No    Emotionally abused: No    Physically abused: No    Forced sexual activity: No  Other Topics Concern  . Not on file  Social History Narrative  . Not on file   Social History   Tobacco Use  Smoking Status Never Smoker  Smokeless Tobacco Never Used   Social History   Substance and Sexual Activity  Alcohol Use No  . Alcohol/week: 0.0 oz    Family History:  Family History  Problem Relation Age of Onset  . Stroke Mother   . Dementia Mother   . Cancer Father        prostate  . Skin cancer Daughter   . Hyperlipidemia Son   . Hypertension Son   . Mental illness Son   . Alcohol abuse Son   . Arthritis Sister        rheumatoid  . Cancer Sister        breast  . Thyroid disease Sister   . Cancer Sister        thyroid  . Cancer Brother         prostate  . Arthritis Maternal Grandmother     Past medical history, surgical history, medications, allergies, family history and social history reviewed with patient today and changes made to appropriate areas of the chart.   Review of Systems  Constitutional: Negative.   HENT: Negative.   Eyes: Positive for redness. Negative for blurred vision, double vision, photophobia, pain and discharge.  Respiratory: Positive for cough and wheezing. Negative for hemoptysis, sputum production and shortness of breath.   Cardiovascular: Negative.   Gastrointestinal: Negative.   Genitourinary: Negative.   Musculoskeletal: Positive for back pain and joint pain. Negative for falls, myalgias and neck pain.  Skin: Negative.        Nonhealing wound on both arms   Neurological: Negative.   Endo/Heme/Allergies: Negative for environmental allergies and polydipsia. Bruises/bleeds easily.  Psychiatric/Behavioral: Negative for depression, hallucinations, memory loss, substance abuse and suicidal ideas. The patient is nervous/anxious. The patient does not have insomnia.     All other ROS negative except what is listed above and in the HPI.      Objective:    BP (!) 142/90   Pulse 90   Temp 98.1 F (36.7 C)   Ht 5\' 5"  (1.651 m)   Wt 225 lb 4 oz (102.2 kg)   SpO2 94%   BMI 37.48 kg/m   Wt Readings from Last 3 Encounters:  04/25/18 225 lb 4 oz (102.2 kg)  04/25/18 225 lb 6.4 oz (102.2 kg)  01/24/18 218 lb 9.6 oz (99.2 kg)    Physical Exam  Constitutional: She is oriented to person, place, and time. She appears well-developed and well-nourished. No distress.  HENT:  Head: Normocephalic and atraumatic.  Right Ear: Hearing, tympanic membrane, external ear and ear canal normal.  Left Ear: Hearing, tympanic membrane, external ear and ear canal normal.  Nose: Nose  normal.  Mouth/Throat: Uvula is midline, oropharynx is clear and moist and mucous membranes are normal. No oropharyngeal exudate.  Eyes:  Pupils are equal, round, and reactive to light. Conjunctivae, EOM and lids are normal. Right eye exhibits no discharge. Left eye exhibits no discharge. No scleral icterus.  Neck: Normal range of motion. Neck supple. No JVD present. No tracheal deviation present. No thyromegaly present.  Cardiovascular: Normal rate, regular rhythm, normal heart sounds and intact distal pulses. Exam reveals no gallop and no friction rub.  No murmur heard. Pulmonary/Chest: Effort normal and breath sounds normal. No stridor. No respiratory distress. She has no wheezes. She has no rales. She exhibits no tenderness.  Abdominal: Soft. Bowel sounds are normal. She exhibits no distension and no mass. There is no tenderness. There is no rebound and no guarding. No hernia.  Genitourinary:  Genitourinary Comments: Breast and pelvic exams deferred with shared decision making  Musculoskeletal: Normal range of motion. She exhibits no edema, tenderness or deformity.  Lymphadenopathy:    She has no cervical adenopathy.  Neurological: She is alert and oriented to person, place, and time. She displays normal reflexes. No cranial nerve deficit or sensory deficit. She exhibits normal muscle tone. Coordination normal.  Skin: Skin is warm, dry and intact. Capillary refill takes less than 2 seconds. No rash noted. She is not diaphoretic. No erythema. No pallor.  Psychiatric: She has a normal mood and affect. Her speech is normal and behavior is normal. Judgment and thought content normal. Cognition and memory are normal.  Nursing note and vitals reviewed.   Results for orders placed or performed in visit on 04/25/18  Microscopic Examination  Result Value Ref Range   WBC, UA 0-5 0 - 5 /hpf   RBC, UA None seen 0 - 2 /hpf   Epithelial Cells (non renal) 0-10 0 - 10 /hpf   Bacteria, UA Few None seen/Few  Urine Culture, Reflex  Result Value Ref Range   Urine Culture, Routine WILL FOLLOW   Bayer DCA Hb A1c Waived  Result Value Ref  Range   Bayer DCA Hb A1c Waived 6.6 <7.0 %  Microalbumin, Urine Waived  Result Value Ref Range   Microalb, Ur Waived 10 0 - 19 mg/L   Creatinine, Urine Waived 50 10 - 300 mg/dL   Microalb/Creat Ratio <30 <30 mg/g  UA/M w/rflx Culture, Routine  Result Value Ref Range   Specific Gravity, UA 1.010 1.005 - 1.030   pH, UA 6.5 5.0 - 7.5   Color, UA Yellow Yellow   Appearance Ur Hazy (A) Clear   Leukocytes, UA 1+ (A) Negative   Protein, UA Negative Negative/Trace   Glucose, UA Negative Negative   Ketones, UA Negative Negative   RBC, UA Negative Negative   Bilirubin, UA Negative Negative   Urobilinogen, Ur 0.2 0.2 - 1.0 mg/dL   Nitrite, UA Negative Negative   Microscopic Examination See below:    Urinalysis Reflex Comment       Assessment & Plan:   Problem List Items Addressed This Visit      Cardiovascular and Mediastinum   HTN (hypertension)    Under good control. Continue to monitor. Continue diet and exercise. Call with any concerns.       Relevant Medications   simvastatin (ZOCOR) 10 MG tablet   Other Relevant Orders   CBC with Differential/Platelet   Comprehensive metabolic panel   Microalbumin, Urine Waived (Completed)   TSH   UA/M w/rflx Culture, Routine (Completed)  Respiratory   Asthma    Under good control. Continue to monitor. Continue diet and exercise. Call with any concerns.       Relevant Orders   CBC with Differential/Platelet   Comprehensive metabolic panel   TSH   UA/M w/rflx Culture, Routine (Completed)     Digestive   GERD (gastroesophageal reflux disease)    Under good control. Continue to monitor. Continue current regimen. Call with any concerns. Refills given.       Relevant Medications   omeprazole (PRILOSEC) 20 MG capsule   Other Relevant Orders   CBC with Differential/Platelet   Comprehensive metabolic panel   TSH   UA/M w/rflx Culture, Routine (Completed)     Endocrine   Diabetes mellitus with renal manifestations, controlled  ([place])    Under good control with A1c of 6.6. Continue to monitor. Continue current regimen. Call with any concerns. Refills given.       Relevant Medications   simvastatin (ZOCOR) 10 MG tablet   metFORMIN (GLUCOPHAGE) 500 MG tablet   Other Relevant Orders   CBC with Differential/Platelet   Bayer DCA Hb A1c Waived (Completed)   Comprehensive metabolic panel   Microalbumin, Urine Waived (Completed)   TSH   UA/M w/rflx Culture, Routine (Completed)   Diabetic neuropathy (HCC)    Stable off medicines. Continue to monitor. Call with any concerns.       Relevant Medications   simvastatin (ZOCOR) 10 MG tablet   metFORMIN (GLUCOPHAGE) 500 MG tablet   Other Relevant Orders   CBC with Differential/Platelet   Bayer DCA Hb A1c Waived (Completed)   Comprehensive metabolic panel   Microalbumin, Urine Waived (Completed)   TSH   UA/M w/rflx Culture, Routine (Completed)     Musculoskeletal and Integument   Osteopenia    Continue diet and exercise. Rechecking vitamin D today. Await results.       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 Referral generated today.      Relevant Medications   ALPRAZolam (XANAX) 1 MG tablet   Other Relevant Orders   Ambulatory referral to Dermatology     Other   Anxiety disorder    Not doing great. Will restart 12.5mg  of seroquel at night. Continue current regimen. Continue to monitor.       Relevant Medications   venlafaxine XR (EFFEXOR-XR) 75 MG 24 hr capsule   venlafaxine XR (EFFEXOR-XR) 150 MG 24 hr capsule   ALPRAZolam (XANAX) 1 MG tablet   Other Relevant Orders   CBC with Differential/Platelet   Comprehensive metabolic panel   TSH   UA/M w/rflx  Culture, Routine (Completed)   Thrombocytopenia (HCC)    Rechecking levels today. Continue current regimen. Continue to monitor. Call with any concerns.       Relevant Orders   CBC with Differential/Platelet   Comprehensive metabolic panel   TSH   UA/M w/rflx Culture, Routine (Completed)   Depression    Not doing great. Will restart 12.5mg  of seroquel at night. Continue current regimen. Continue to monitor.       Relevant Medications   venlafaxine XR (EFFEXOR-XR) 75 MG 24 hr capsule   venlafaxine XR (EFFEXOR-XR) 150 MG 24 hr capsule  ALPRAZolam (XANAX) 1 MG tablet   Other Relevant Orders   CBC with Differential/Platelet   Comprehensive metabolic panel   TSH   UA/M w/rflx Culture, Routine (Completed)   Hyperlipidemia    Under good control. Continue current regimen. Continue to monitor. Call with any concerns.       Relevant Medications   simvastatin (ZOCOR) 10 MG tablet   Other Relevant Orders   CBC with Differential/Platelet   Comprehensive metabolic panel   Lipid Panel w/o Chol/HDL Ratio   TSH   UA/M w/rflx Culture, Routine (Completed)    Other Visit Diagnoses    Routine general medical examination at a health care facility    -  Primary   Vaccines up to date. Screening labs checked today. Continue diet and exercise. Call with any concerns. Preventative care discussed   Open wound of left forearm, initial encounter       Up to date on tetanus, but given history of squamous cells, will get her into dermatology. Referral generated today.   Relevant Orders   Ambulatory referral to Dermatology       Follow up plan: Return in about 6 months (around 10/25/2018) for follow up.   LABORATORY TESTING:  - Pap smear: not applicable  IMMUNIZATIONS:   - Tdap: Tetanus vaccination status reviewed: last tetanus booster within 10 years. - Influenza: Up to date - Pneumovax: Up to date - Prevnar: Up to date - HPV: Not applicable - Zostavax vaccine: Up to  date  SCREENING: -Mammogram: Up to date  - Colonoscopy: Up to date  - Bone Density: Up to date   PATIENT COUNSELING:   Advised to take 1 mg of folate supplement per day if capable of pregnancy.   Sexuality: Discussed sexually transmitted diseases, partner selection, use of condoms, avoidance of unintended pregnancy  and contraceptive alternatives.   Advised to avoid cigarette smoking.  I discussed with the patient that most people either abstain from alcohol or drink within safe limits (<=14/week and <=4 drinks/occasion for males, <=7/weeks and <= 3 drinks/occasion for females) and that the risk for alcohol disorders and other health effects rises proportionally with the number of drinks per week and how often a drinker exceeds daily limits.  Discussed cessation/primary prevention of drug use and availability of treatment for abuse.   Diet: Encouraged to adjust caloric intake to maintain  or achieve ideal body weight, to reduce intake of dietary saturated fat and total fat, to limit sodium intake by avoiding high sodium foods and not adding table salt, and to maintain adequate dietary potassium and calcium preferably from fresh fruits, vegetables, and low-fat dairy products.    stressed the importance of regular exercise  Injury prevention: Discussed safety belts, safety helmets, smoke detector, smoking near bedding or upholstery.   Dental health: Discussed importance of regular tooth brushing, flossing, and dental visits.    NEXT PREVENTATIVE PHYSICAL DUE IN 1 YEAR. Return in about 6 months (around 10/25/2018) for follow up.

## 2018-04-25 NOTE — Assessment & Plan Note (Signed)
Under good control. Continue to monitor. Continue current regimen. Call with any concerns. Refills given.

## 2018-04-25 NOTE — Assessment & Plan Note (Signed)
Under good control with A1c of 6.6. Continue to monitor. Continue current regimen. Call with any concerns. Refills given.

## 2018-04-25 NOTE — Assessment & Plan Note (Signed)
Continue diet and exercise. Rechecking vitamin D today. Await results.

## 2018-04-25 NOTE — Assessment & Plan Note (Signed)
Rechecking levels today. Continue current regimen. Continue to monitor. Call with any concerns.  

## 2018-04-26 LAB — VITAMIN D 25 HYDROXY (VIT D DEFICIENCY, FRACTURES): Vit D, 25-Hydroxy: 52.4 ng/mL (ref 30.0–100.0)

## 2018-04-26 LAB — COMPREHENSIVE METABOLIC PANEL
ALT: 20 IU/L (ref 0–32)
AST: 39 IU/L (ref 0–40)
Albumin/Globulin Ratio: 1.4 (ref 1.2–2.2)
Albumin: 3.8 g/dL (ref 3.5–4.8)
Alkaline Phosphatase: 103 IU/L (ref 39–117)
BUN/Creatinine Ratio: 11 — ABNORMAL LOW (ref 12–28)
BUN: 10 mg/dL (ref 8–27)
Bilirubin Total: 0.6 mg/dL (ref 0.0–1.2)
CO2: 24 mmol/L (ref 20–29)
Calcium: 8.9 mg/dL (ref 8.7–10.3)
Chloride: 101 mmol/L (ref 96–106)
Creatinine, Ser: 0.94 mg/dL (ref 0.57–1.00)
GFR calc Af Amer: 69 mL/min/{1.73_m2} (ref >59)
GFR calc non Af Amer: 60 mL/min/{1.73_m2} (ref >59)
Globulin, Total: 2.7 g/dL (ref 1.5–4.5)
Glucose: 117 mg/dL — ABNORMAL HIGH (ref 65–99)
Potassium: 4.3 mmol/L (ref 3.5–5.2)
Sodium: 140 mmol/L (ref 134–144)
Total Protein: 6.5 g/dL (ref 6.0–8.5)

## 2018-04-26 LAB — TSH: TSH: 4.35 u[IU]/mL (ref 0.450–4.500)

## 2018-04-26 LAB — CBC WITH DIFFERENTIAL/PLATELET
BASOS ABS: 0 10*3/uL (ref 0.0–0.2)
Basos: 0 %
EOS (ABSOLUTE): 0.1 10*3/uL (ref 0.0–0.4)
EOS: 1 %
HEMOGLOBIN: 11.8 g/dL (ref 11.1–15.9)
Hematocrit: 37 % (ref 34.0–46.6)
IMMATURE GRANS (ABS): 0 10*3/uL (ref 0.0–0.1)
IMMATURE GRANULOCYTES: 0 %
LYMPHS: 42 %
Lymphocytes Absolute: 1.9 10*3/uL (ref 0.7–3.1)
MCH: 25.9 pg — ABNORMAL LOW (ref 26.6–33.0)
MCHC: 31.9 g/dL (ref 31.5–35.7)
MCV: 81 fL (ref 79–97)
MONOCYTES: 7 %
Monocytes Absolute: 0.3 10*3/uL (ref 0.1–0.9)
Neutrophils Absolute: 2.3 10*3/uL (ref 1.4–7.0)
Neutrophils: 50 %
Platelets: 139 10*3/uL — ABNORMAL LOW (ref 150–379)
RBC: 4.55 x10E6/uL (ref 3.77–5.28)
RDW: 17.7 % — ABNORMAL HIGH (ref 12.3–15.4)
WBC: 4.6 10*3/uL (ref 3.4–10.8)

## 2018-04-26 LAB — LIPID PANEL W/O CHOL/HDL RATIO
Cholesterol, Total: 135 mg/dL (ref 100–199)
HDL: 48 mg/dL (ref >39)
LDL CALC: 66 mg/dL (ref 0–99)
Triglycerides: 103 mg/dL (ref 0–149)
VLDL CHOLESTEROL CAL: 21 mg/dL (ref 5–40)

## 2018-04-27 LAB — UA/M W/RFLX CULTURE, ROUTINE
BILIRUBIN UA: NEGATIVE
GLUCOSE, UA: NEGATIVE
KETONES UA: NEGATIVE
NITRITE UA: NEGATIVE
PROTEIN UA: NEGATIVE
RBC, UA: NEGATIVE
Specific Gravity, UA: 1.01 (ref 1.005–1.030)
UUROB: 0.2 mg/dL (ref 0.2–1.0)
pH, UA: 6.5 (ref 5.0–7.5)

## 2018-04-27 LAB — MICROSCOPIC EXAMINATION: RBC, UA: NONE SEEN /hpf (ref 0–2)

## 2018-04-27 LAB — MICROALBUMIN, URINE WAIVED
Creatinine, Urine Waived: 50 mg/dL (ref 10–300)
Microalb, Ur Waived: 10 mg/L (ref 0–19)
Microalb/Creat Ratio: <30 mg/g (ref <30)

## 2018-04-27 LAB — URINE CULTURE, REFLEX

## 2018-04-27 LAB — BAYER DCA HB A1C WAIVED: HB A1C: 6.6 % (ref <7.0)

## 2018-04-28 ENCOUNTER — Encounter: Payer: Self-pay | Admitting: Family Medicine

## 2018-05-14 ENCOUNTER — Telehealth: Payer: Self-pay | Admitting: Family Medicine

## 2018-05-14 NOTE — Telephone Encounter (Signed)
Per last visit A&P:  Depression     Not doing great. Will restart 12.5mg  of seroquel at night. Continue current regimen. Continue to monitor.       Relevant Medications   venlafaxine XR (EFFEXOR-XR) 75 MG 24 hr capsule   venlafaxine XR (EFFEXOR-XR) 150 MG 24 hr capsule   ALPRAZolam (XANAX) 1 MG tablet     Please advise.

## 2018-05-14 NOTE — Telephone Encounter (Signed)
Copied from [place] [id number]. Topic: Quick Communication - See Telephone Encounter >> May 14, 2018  1:28 PM Percell Belt A wrote: CRM for notification. See Telephone encounter for: 05/14/18  Pt daughter called in and would to try something different from the QUEtiapine (SEROQUEL) 25 MG tablet [[id number]] because she is falling all the time on this med  Best number [facility]

## 2018-05-20 MED ORDER — ARIPIPRAZOLE 2 MG PO TABS: 2.0000 mg | ORAL_TABLET | Freq: Every day | ORAL | 3 refills | Status: DC

## 2018-05-20 NOTE — Telephone Encounter (Signed)
She can try the abilify- it should help with sleep. If they don't want to take that she will need to be seen.

## 2018-05-20 NOTE — Telephone Encounter (Signed)
Patient's daughter notified.

## 2018-05-20 NOTE — Telephone Encounter (Signed)
Can you please let daughter know that I sent through some low dose abilify and we'll see how she does

## 2018-05-20 NOTE — Telephone Encounter (Signed)
Patient's daughter states that she needs something for sleep, she has stopped the seroquel, because it was making her dizzy and stuff the next day, she doesn't need anything for her mood that is doing good, she needs something to help with sleep, she has been having to double her xanax  To sleep at night.

## 2018-07-09 DIAGNOSIS — Z85828 Personal history of other malignant neoplasm of skin: Secondary | ICD-10-CM | POA: Diagnosis not present

## 2018-07-09 DIAGNOSIS — D229 Melanocytic nevi, unspecified: Secondary | ICD-10-CM | POA: Diagnosis not present

## 2018-07-09 DIAGNOSIS — L814 Other melanin hyperpigmentation: Secondary | ICD-10-CM | POA: Diagnosis not present

## 2018-07-09 DIAGNOSIS — Z85820 Personal history of malignant melanoma of skin: Secondary | ICD-10-CM | POA: Diagnosis not present

## 2018-07-22 ENCOUNTER — Ambulatory Visit (INDEPENDENT_AMBULATORY_CARE_PROVIDER_SITE_OTHER): Payer: Medicare HMO | Admitting: Family Medicine

## 2018-07-22 ENCOUNTER — Encounter: Payer: Self-pay | Admitting: Family Medicine

## 2018-07-22 VITALS — BP 144/82 | HR 88 | Temp 98.5°F | Wt 222.5 lb

## 2018-07-22 DIAGNOSIS — H9222 Otorrhagia, left ear: Secondary | ICD-10-CM

## 2018-07-22 DIAGNOSIS — E1122 Type 2 diabetes mellitus with diabetic chronic kidney disease: Secondary | ICD-10-CM | POA: Diagnosis not present

## 2018-07-22 DIAGNOSIS — N182 Chronic kidney disease, stage 2 (mild): Secondary | ICD-10-CM

## 2018-07-22 DIAGNOSIS — F332 Major depressive disorder, recurrent severe without psychotic features: Secondary | ICD-10-CM | POA: Diagnosis not present

## 2018-07-22 LAB — BAYER DCA HB A1C WAIVED: HB A1C (BAYER DCA - WAIVED): 6.2 % (ref <7.0)

## 2018-07-22 MED ORDER — ALPRAZOLAM 1 MG PO TABS
1.0000 mg | ORAL_TABLET | Freq: Two times a day (BID) | ORAL | 0 refills | Status: DC | PRN
Start: 2018-07-22 — End: 2018-07-22

## 2018-07-22 MED ORDER — ALPRAZOLAM 1 MG PO TABS: 1.0000 mg | ORAL_TABLET | Freq: Two times a day (BID) | ORAL | 0 refills | Status: DC | PRN

## 2018-07-22 MED ORDER — ALPRAZOLAM 1 MG PO TABS: 1.0000 mg | ORAL_TABLET | Freq: Two times a day (BID) | ORAL | 0 refills | Status: AC | PRN

## 2018-07-22 NOTE — Assessment & Plan Note (Addendum)
Under good control with A1c of 6.2. Continue current regimen. Continue to monitor. Call with any concerns.

## 2018-07-22 NOTE — Progress Notes (Signed)
BP (!) 144/82 (BP Location: Right Arm, Patient Position: Sitting, Cuff Size: Large)   Pulse 88   Temp 98.5 F (36.9 C)   Wt 222 lb 8 oz (100.9 kg)   SpO2 95%   BMI 37.03 kg/m    Subjective:    Patient ID: Dawn Moore, female    DOB: 1941/05/25, 77 y.o.   MRN: [id number]  HPI: Dawn Moore is a 77 y.o. female  Chief Complaint  Patient presents with  . Diabetes  . Ear Pain    Patient woke up with blood in her ear the other morning  . Anxiety   DIABETES Hypoglycemic episodes:no Polydipsia/polyuria: no Visual disturbance: no Chest pain: no Paresthesias: no Glucose Monitoring: no  Accucheck frequency: Not Checking Taking Insulin?: no Blood Pressure Monitoring: not checking Retinal Examination: Not up to Date Foot Exam: Up to Date Diabetic Education: Completed Pneumovax: Up to Date Influenza: Up to Date Aspirin: yes   ANXIETY/STRESS- has not been doing well. Getting close to the trial for her son's murder. Confusion is a lot better. Hasn't had the xanax in 3 months.  Duration:stable Anxious mood: yes  Excessive worrying: yes Irritability: yes  Sweating: no Nausea: no Palpitations:no Hyperventilation: no Panic attacks: yes Agoraphobia: no  Obscessions/compulsions: no Depressed mood: yes Depression screen [facility] 2/9 07/22/2018 04/25/2018 01/24/2018 10/24/2017 07/23/2017  Decreased Interest 2 2 1 3 3   Down, Depressed, Hopeless 2 3 1 3 3   PHQ - 2 Score 4 5 2 6 6   Altered sleeping 3 3 1 3 3   Tired, decreased energy 3 2 1 3 3   Change in appetite 2 1 1 2 3   Feeling bad or failure about yourself  2 2 0 2 2  Trouble concentrating 2 2 1 3 3   Moving slowly or fidgety/restless 0 0 0 3 3  Suicidal thoughts 0 0 0 0 2  PHQ-9 Score 16 15 6 22 25   Difficult doing work/chores Very difficult Somewhat difficult Somewhat difficult Very difficult -  Some recent data might be hidden   GAD 7 : Generalized Anxiety Score 07/22/2018 01/24/2018 07/23/2017 02/27/2016  Nervous, Anxious, on  Edge 3 1 3 3   Control/stop worrying 3 1 3 3   Worry too much - different things 3 1 3 3   Trouble relaxing 3 1 3 1   Restless 3 0 3 1  Easily annoyed or irritable 3 1 3 3   Afraid - awful might happen 3 1 2 2   Total GAD 7 Score 21 6 20 16   Anxiety Difficulty Somewhat difficult Somewhat difficult Extremely difficult Somewhat difficult   Anhedonia: yes Weight changes: no Insomnia: yes hard to fall asleep  Hypersomnia: yes Fatigue/loss of energy: yes Feelings of worthlessness: yes Feelings of guilt: yes Impaired concentration/indecisiveness: yes Suicidal ideations: no  Crying spells: yes Recent Stressors/Life Changes: yes   Relationship problems: no   Family stress: yes     Financial stress: no    Job stress: no    Recent death/loss: yes  EAR PAIN Duration: 3 days ago Involved ear(s): right Severity:  moderate  Quality:  "pain" Fever: no Otorrhea: yes- blood Upper respiratory infection symptoms: no Pruritus: no Hearing loss: yes Water immersion no Using Q-tips: yes Recurrent otitis media: no Status: better Treatments attempted: none    Relevant past medical, surgical, family and social history reviewed and updated as indicated. Interim medical history since our last visit reviewed. Allergies and medications reviewed and updated.  Review of Systems  Constitutional: Negative.  Respiratory: Negative.   Cardiovascular: Negative.   Skin: Negative.   Neurological: Negative.   Psychiatric/Behavioral: Positive for confusion and sleep disturbance. Negative for agitation, behavioral problems, decreased concentration, dysphoric mood, hallucinations, self-injury and suicidal ideas. The patient is nervous/anxious. The patient is not hyperactive.     Per HPI unless specifically indicated above     Objective:    BP (!) 144/82 (BP Location: Right Arm, Patient Position: Sitting, Cuff Size: Large)   Pulse 88   Temp 98.5 F (36.9 C)   Wt 222 lb 8 oz (100.9 kg)   SpO2 95%    BMI 37.03 kg/m   Wt Readings from Last 3 Encounters:  07/22/18 222 lb 8 oz (100.9 kg)  04/25/18 225 lb 4 oz (102.2 kg)  04/25/18 225 lb 6.4 oz (102.2 kg)    Physical Exam  Constitutional: She is oriented to person, place, and time. She appears well-developed and well-nourished. No distress.  HENT:  Head: Normocephalic and atraumatic.  Right Ear: Hearing and external ear normal.  Left Ear: Hearing normal.  Nose: Nose normal.  Mouth/Throat: Oropharynx is clear and moist. No oropharyngeal exudate.  Small amount of dried blood in L ear  Eyes: Pupils are equal, round, and reactive to light. Conjunctivae, EOM and lids are normal. Right eye exhibits no discharge. Left eye exhibits no discharge. No scleral icterus.  Neck: Normal range of motion. Neck supple. No JVD present. No tracheal deviation present. No thyromegaly present.  Cardiovascular: Normal rate, regular rhythm, normal heart sounds and intact distal pulses. Exam reveals no gallop and no friction rub.  No murmur heard. Pulmonary/Chest: Effort normal and breath sounds normal. No stridor. No respiratory distress. She has no wheezes. She has no rales. She exhibits no tenderness.  Musculoskeletal: Normal range of motion.  Lymphadenopathy:    She has no cervical adenopathy.  Neurological: She is alert and oriented to person, place, and time.  Skin: Skin is warm, dry and intact. Capillary refill takes less than 2 seconds. No rash noted. She is not diaphoretic. No erythema. No pallor.  Psychiatric: Her speech is normal and behavior is normal. Judgment and thought content normal. Her mood appears anxious. Cognition and memory are normal. She exhibits a depressed mood.  Nursing note and vitals reviewed.   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 Continue current regimen. Continue to monitor. Call with any concerns.       Relevant Orders   Bayer DCA Hb A1c Waived (Completed)     Other   Depression    Not under good control. Has tried abilify, buspar, hydroxyzine, xanax, effexor, Seroquel, gabapentin, wellbutrin, and several other SSRIs without lasting or significant benefit and significant confusion even on small doses. Refuses counseling. Very resistant to seeing psychiatry. Has upcoming very stressful event with the trial of the accused murderer of her son. Will get her into see psychiatry. Referral generated today. Refill of her xanax given today. Recheck 3 months.       Relevant Medications   ALPRAZolam (XANAX) 1 MG tablet   ALPRAZolam (XANAX) 1 MG tablet (Start on 08/21/2018)  ALPRAZolam (XANAX) 1 MG tablet (Start on 09/20/2018)   Other Relevant Orders   Ambulatory referral to Psychiatry    Other Visit Diagnoses    Blood in left ear canal       Stop using q-tips. Moisturize ear canal. Call if not getting better or getting worse.        Follow up plan: Return in about 3 months (around 10/22/2018) for Follow up.

## 2018-07-22 NOTE — Assessment & Plan Note (Signed)
Not under good control. Has tried abilify, buspar, hydroxyzine, xanax, effexor, Seroquel, gabapentin, wellbutrin, and several other SSRIs without lasting or significant benefit and significant confusion even on small doses. Refuses counseling. Very resistant to seeing psychiatry. Has upcoming very stressful event with the trial of the accused murderer of her son. Will get her into see psychiatry. Referral generated today. Refill of her xanax given today. Recheck 3 months.

## 2018-07-28 ENCOUNTER — Ambulatory Visit: Payer: Medicare HMO | Admitting: Family Medicine

## 2018-08-18 IMAGING — DX DG WRIST COMPLETE 3+V*L*
4 series · 4 of 4 positions shown · non-contrast
Comparison: None in PACs

CLINICAL DATA: Status post fall earlier today with pain in the
wrist radiating into the thumb.

EXAM:
LEFT WRIST - COMPLETE 3+ VIEW

[wrist ap (1 of 2)]
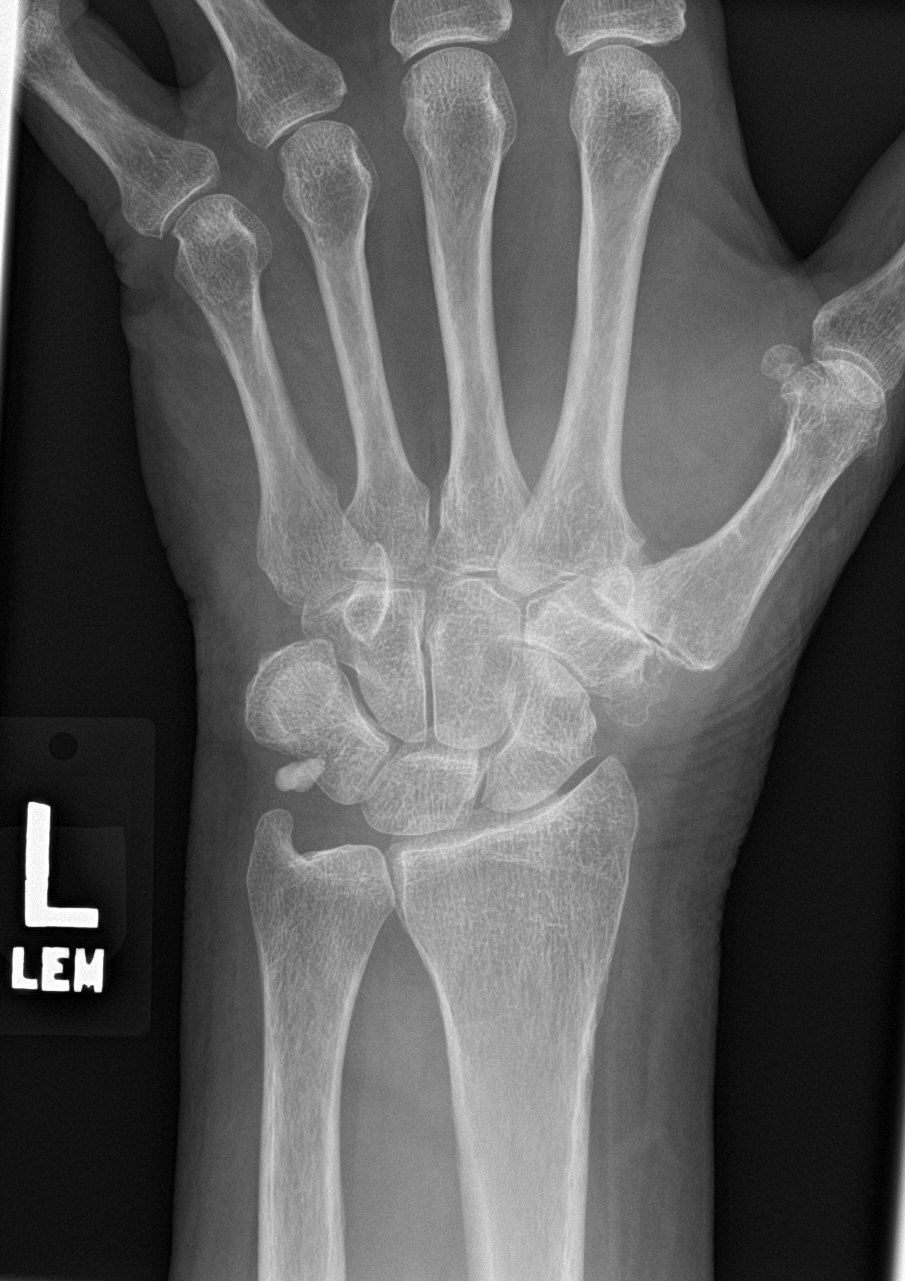

[wrist obl]
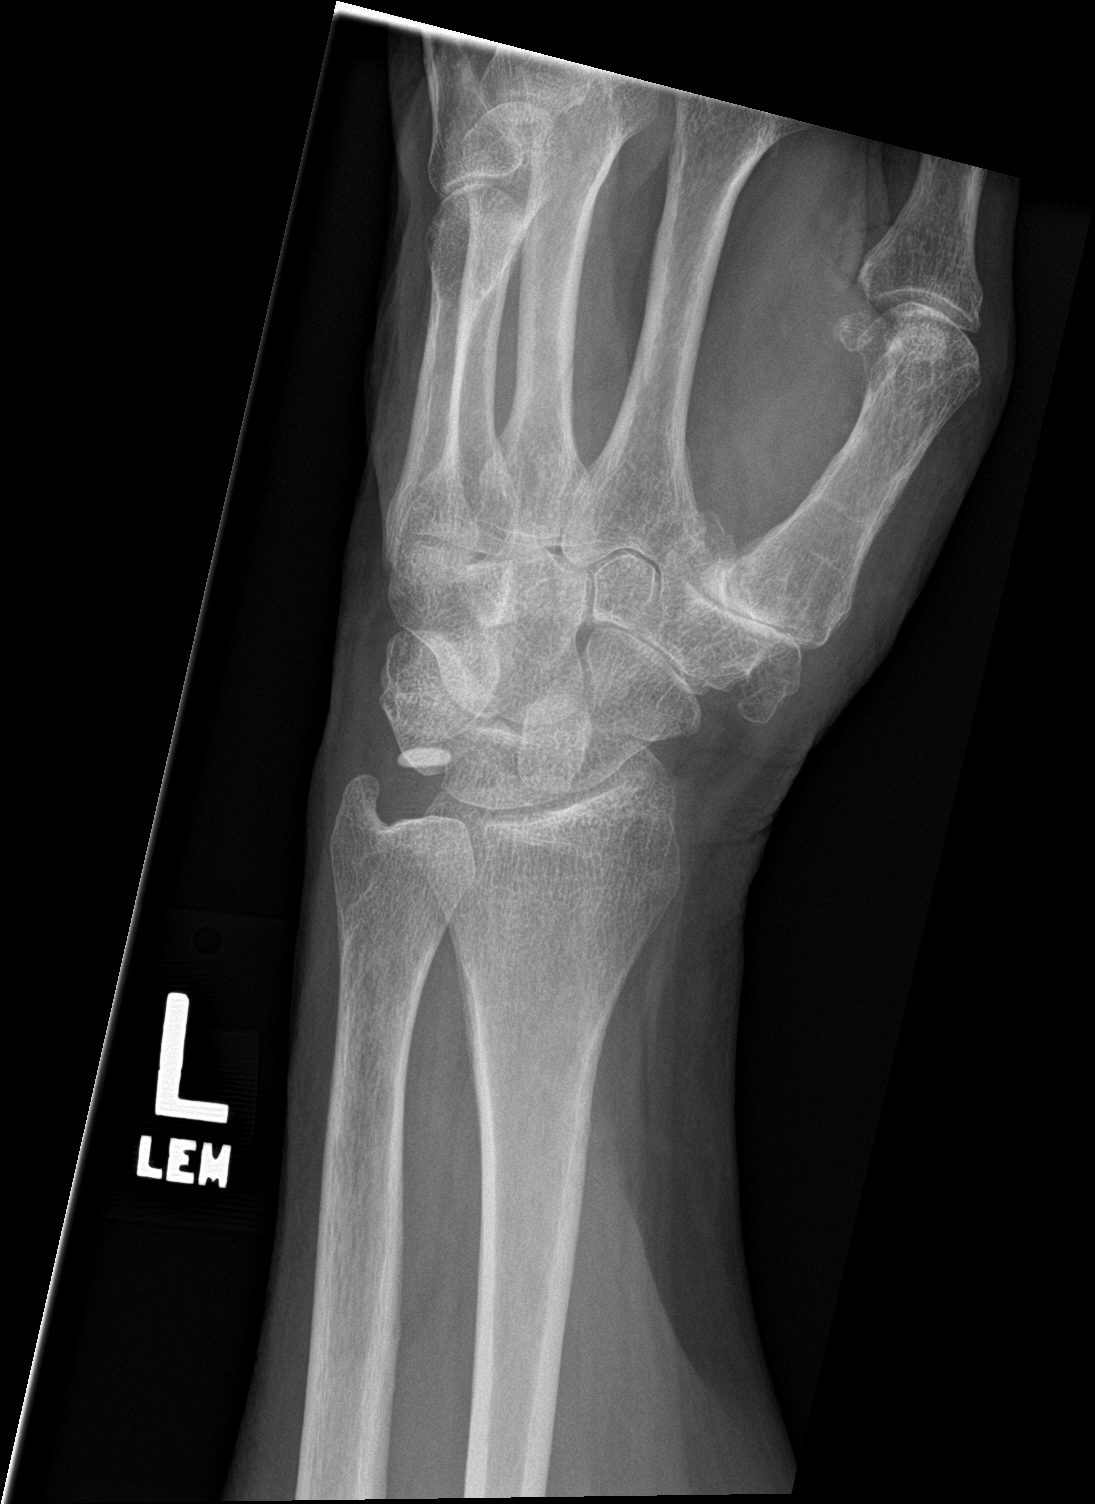

[wrist lat]
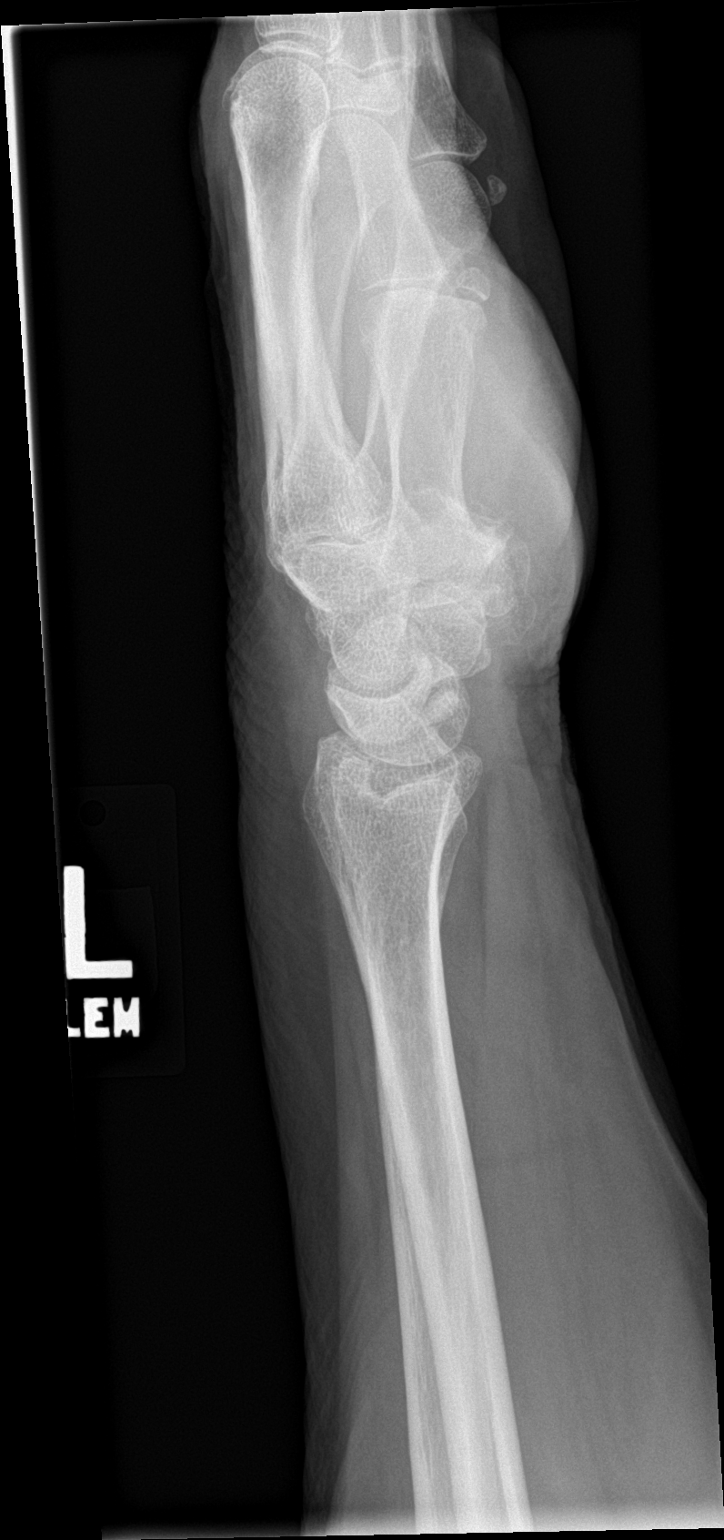

[wrist ap (2 of 2)]
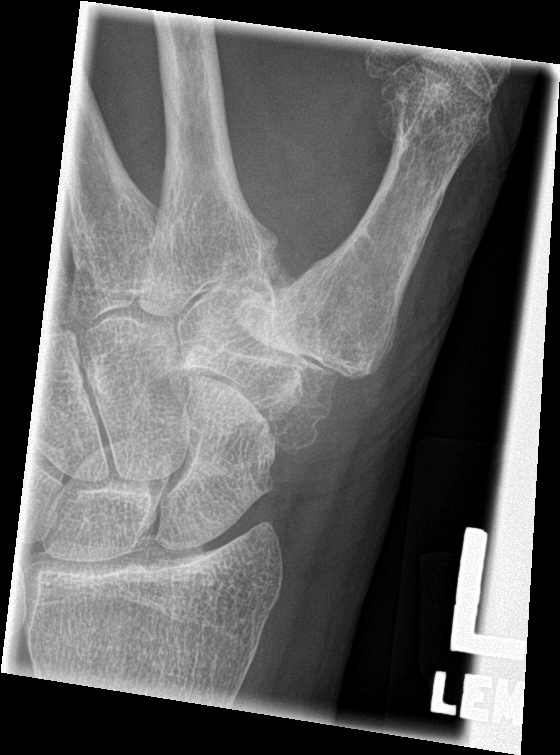

[4 of 4 positions shown; findings below may reference images not displayed]

FINDINGS: The bones are subjectively adequately mineralized. There is mild
narrowing of the radiocarpal joint. There is calcification within
the soft tissues of the ulnocarpal joint. There is mild narrowing of
the articulation of the scaphoid with the trapezium and trapezoid.
There is moderate joint space loss of the first CMC joint. There is
mild diffuse soft tissue swelling. The metacarpals are intact where
visualized.
IMPRESSION: There are degenerative changes as described. There is no acute
fracture nor dislocation.

## 2018-09-02 ENCOUNTER — Ambulatory Visit: Payer: Medicare HMO | Admitting: Psychiatry

## 2018-09-30 IMAGING — CR DG LUMBAR SPINE COMPLETE 4+V
5 series · 5 of 5 positions shown · non-contrast
Comparison: Lumbar spine radiographs March 10, 2014

CLINICAL DATA: Fell backwards after bending down.  Low back pain.

EXAM:
LUMBAR SPINE - COMPLETE 4+ VIEW

[l-spine ap]
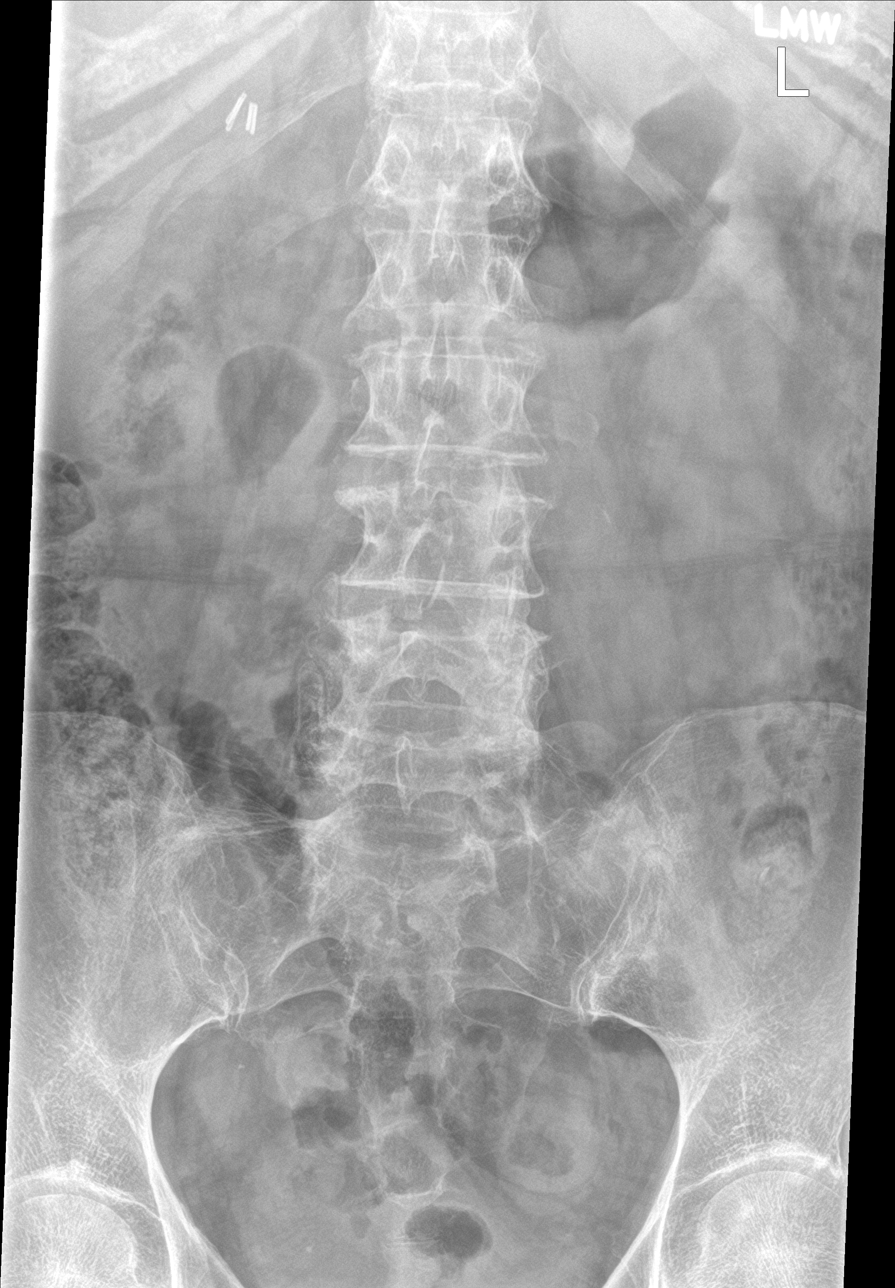

[l-spine obl (1 of 2)]
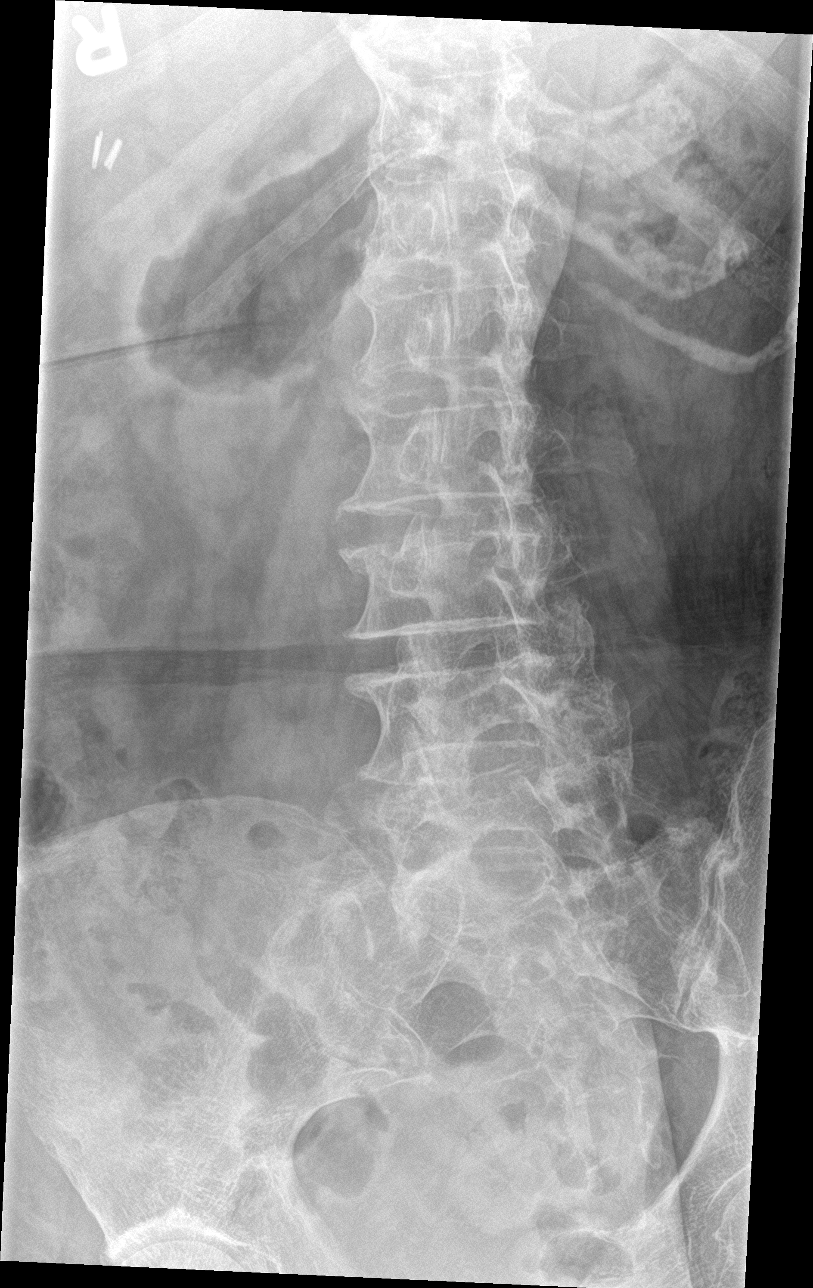

[l-spine obl (2 of 2)]
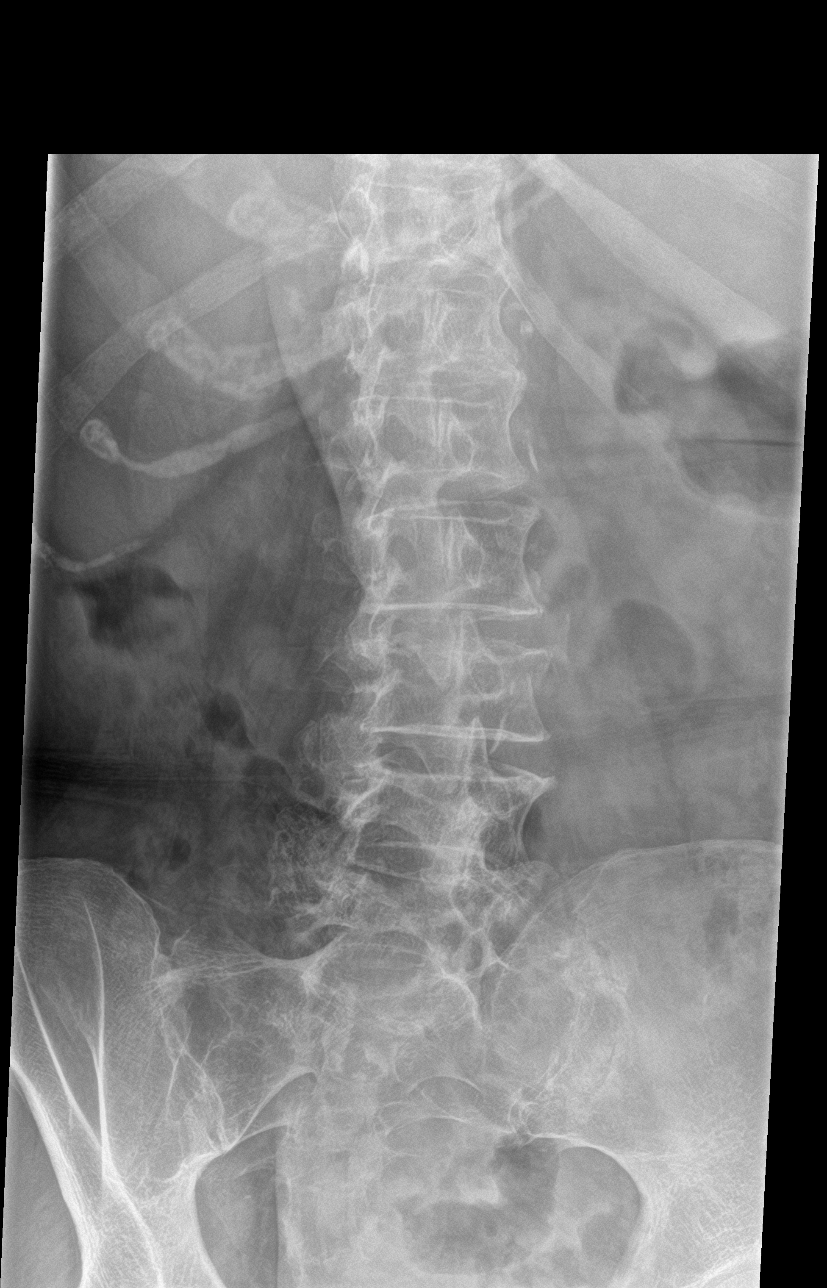

[l-spine lat]
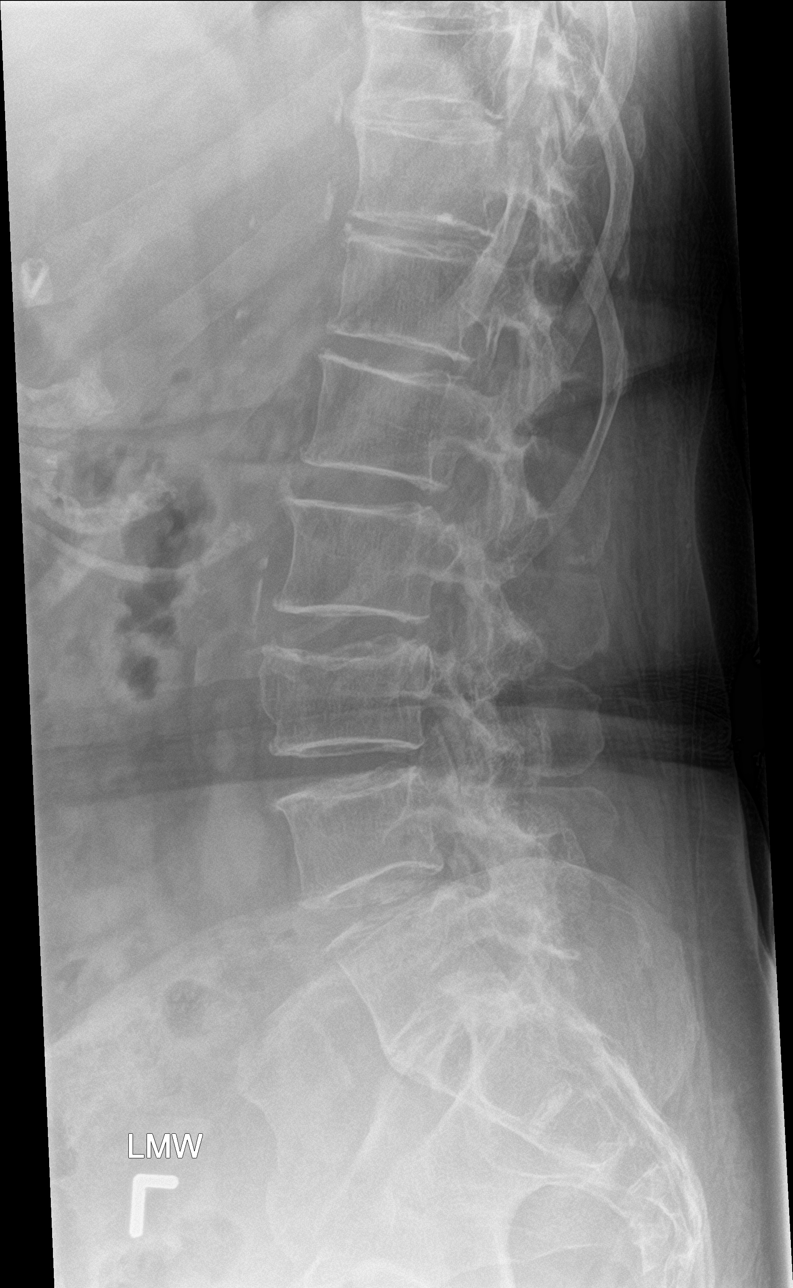

[l-spine spot]
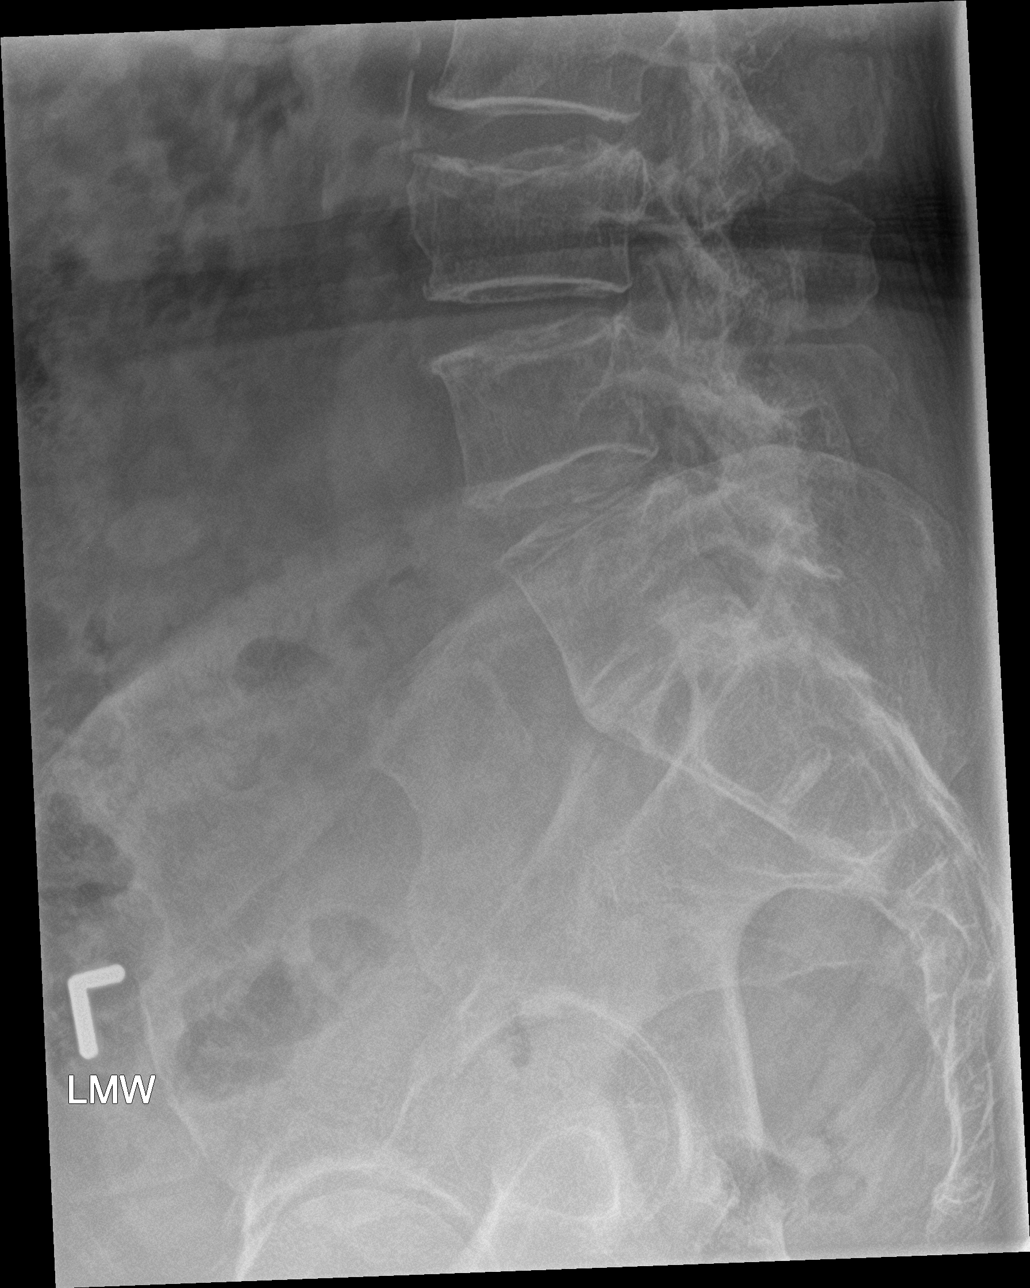

[5 of 5 positions shown; findings below may reference images not displayed]

FINDINGS: Mild L4 compression fracture with less than 25% height loss and
endplate sclerosis. Mild L5 superior endplate compression fracture.
Maintained alignment and lumbar lordosis. Intervertebral disc
heights are normal. Osteopenia. No destructive bony lesions.
Moderate lower lumbar facet arthropathy.

Sacroiliac joints are symmetric. Included prevertebral and
paraspinal soft tissue planes are non-suspicious.
IMPRESSION: New mild L4 and L5 compression fractures, possibly acute L5. No
malalignment.

## 2018-10-23 ENCOUNTER — Ambulatory Visit: Payer: Medicare HMO | Admitting: Family Medicine

## 2018-11-13 ENCOUNTER — Encounter: Payer: Self-pay | Admitting: Family Medicine

## 2018-11-13 ENCOUNTER — Ambulatory Visit (INDEPENDENT_AMBULATORY_CARE_PROVIDER_SITE_OTHER): Payer: Medicare HMO | Admitting: Family Medicine

## 2018-11-13 VITALS — BP 139/76 | HR 100 | Temp 99.0°F | Wt 217.0 lb

## 2018-11-13 DIAGNOSIS — F332 Major depressive disorder, recurrent severe without psychotic features: Secondary | ICD-10-CM | POA: Diagnosis not present

## 2018-11-13 DIAGNOSIS — R21 Rash and other nonspecific skin eruption: Secondary | ICD-10-CM

## 2018-11-13 DIAGNOSIS — I10 Essential (primary) hypertension: Secondary | ICD-10-CM | POA: Diagnosis not present

## 2018-11-13 DIAGNOSIS — E1122 Type 2 diabetes mellitus with diabetic chronic kidney disease: Secondary | ICD-10-CM | POA: Diagnosis not present

## 2018-11-13 DIAGNOSIS — N182 Chronic kidney disease, stage 2 (mild): Secondary | ICD-10-CM

## 2018-11-13 DIAGNOSIS — F411 Generalized anxiety disorder: Secondary | ICD-10-CM

## 2018-11-13 DIAGNOSIS — E782 Mixed hyperlipidemia: Secondary | ICD-10-CM | POA: Diagnosis not present

## 2018-11-13 DIAGNOSIS — B372 Candidiasis of skin and nail: Secondary | ICD-10-CM | POA: Diagnosis not present

## 2018-11-13 DIAGNOSIS — D696 Thrombocytopenia, unspecified: Secondary | ICD-10-CM

## 2018-11-13 DIAGNOSIS — E1342 Other specified diabetes mellitus with diabetic polyneuropathy: Secondary | ICD-10-CM

## 2018-11-13 DIAGNOSIS — R8281 Pyuria: Secondary | ICD-10-CM

## 2018-11-13 DIAGNOSIS — Z23 Encounter for immunization: Secondary | ICD-10-CM

## 2018-11-13 LAB — UA/M W/RFLX CULTURE, ROUTINE
Bilirubin, UA: NEGATIVE
Glucose, UA: NEGATIVE
NITRITE UA: NEGATIVE
PH UA: 6.5 (ref 5.0–7.5)
RBC, UA: NEGATIVE
Specific Gravity, UA: 1.02 (ref 1.005–1.030)
Urobilinogen, Ur: 1 mg/dL (ref 0.2–1.0)

## 2018-11-13 LAB — MICROALBUMIN, URINE WAIVED
Creatinine, Urine Waived: 100 mg/dL (ref 10–300)
MICROALB, UR WAIVED: 80 mg/L — AB (ref 0–19)

## 2018-11-13 LAB — MICROSCOPIC EXAMINATION

## 2018-11-13 LAB — BAYER DCA HB A1C WAIVED: HB A1C: 6.5 % (ref <7.0)

## 2018-11-13 MED ORDER — FLUTICASONE PROPIONATE 50 MCG/ACT NA SUSP: 2.0000 | Freq: Every day | NASAL | 3 refills | Status: DC

## 2018-11-13 MED ORDER — SIMVASTATIN 10 MG PO TABS: 10.0000 mg | ORAL_TABLET | Freq: Every evening | ORAL | 1 refills | Status: DC

## 2018-11-13 MED ORDER — ALPRAZOLAM 1 MG PO TABS: 1.0000 mg | ORAL_TABLET | Freq: Two times a day (BID) | ORAL | 0 refills | Status: AC | PRN

## 2018-11-13 MED ORDER — VENLAFAXINE HCL ER 150 MG PO CP24: 150.0000 mg | ORAL_CAPSULE | Freq: Every day | ORAL | 1 refills | Status: DC

## 2018-11-13 MED ORDER — METFORMIN HCL 500 MG PO TABS: ORAL_TABLET | ORAL | 1 refills | Status: DC

## 2018-11-13 MED ORDER — ALPRAZOLAM 1 MG PO TABS: 1.0000 mg | ORAL_TABLET | Freq: Two times a day (BID) | ORAL | 0 refills | Status: DC | PRN

## 2018-11-13 MED ORDER — VENLAFAXINE HCL ER 75 MG PO CP24: ORAL_CAPSULE | ORAL | 1 refills | Status: DC

## 2018-11-13 NOTE — Patient Instructions (Signed)

## 2018-11-14 ENCOUNTER — Encounter: Payer: Self-pay | Admitting: Family Medicine

## 2018-11-14 LAB — COMPREHENSIVE METABOLIC PANEL
ALBUMIN: 3.9 g/dL (ref 3.5–4.8)
ALT: 30 IU/L (ref 0–32)
AST: 49 IU/L — ABNORMAL HIGH (ref 0–40)
Albumin/Globulin Ratio: 1.3 (ref 1.2–2.2)
Alkaline Phosphatase: 115 IU/L (ref 39–117)
BILIRUBIN TOTAL: 0.5 mg/dL (ref 0.0–1.2)
BUN / CREAT RATIO: 11 — AB (ref 12–28)
BUN: 11 mg/dL (ref 8–27)
CO2: 20 mmol/L (ref 20–29)
CREATININE: 0.99 mg/dL (ref 0.57–1.00)
Calcium: 9.1 mg/dL (ref 8.7–10.3)
Chloride: 100 mmol/L (ref 96–106)
GFR calc Af Amer: 64 mL/min/{1.73_m2} (ref >59)
GFR, EST NON AFRICAN AMERICAN: 56 mL/min/{1.73_m2} — AB (ref >59)
GLUCOSE: 283 mg/dL — AB (ref 65–99)
Globulin, Total: 2.9 g/dL (ref 1.5–4.5)
Potassium: 4.3 mmol/L (ref 3.5–5.2)
Sodium: 136 mmol/L (ref 134–144)
Total Protein: 6.8 g/dL (ref 6.0–8.5)

## 2018-11-14 LAB — CBC WITH DIFFERENTIAL/PLATELET
BASOS ABS: 0 10*3/uL (ref 0.0–0.2)
Basos: 0 %
EOS (ABSOLUTE): 0.1 10*3/uL (ref 0.0–0.4)
Eos: 1 %
HEMOGLOBIN: 12 g/dL (ref 11.1–15.9)
Hematocrit: 37.3 % (ref 34.0–46.6)
IMMATURE GRANS (ABS): 0 10*3/uL (ref 0.0–0.1)
Immature Granulocytes: 0 %
Lymphocytes Absolute: 1.6 10*3/uL (ref 0.7–3.1)
Lymphs: 29 %
MCH: 27 pg (ref 26.6–33.0)
MCHC: 32.2 g/dL (ref 31.5–35.7)
MCV: 84 fL (ref 79–97)
MONOCYTES: 6 %
Monocytes Absolute: 0.3 10*3/uL (ref 0.1–0.9)
NEUTROS ABS: 3.5 10*3/uL (ref 1.4–7.0)
Neutrophils: 64 %
Platelets: 151 10*3/uL (ref 150–450)
RBC: 4.44 x10E6/uL (ref 3.77–5.28)
RDW: 15.5 % — ABNORMAL HIGH (ref 12.3–15.4)
WBC: 5.5 10*3/uL (ref 3.4–10.8)

## 2018-11-14 LAB — LIPID PANEL W/O CHOL/HDL RATIO
Cholesterol, Total: 122 mg/dL (ref 100–199)
HDL: 40 mg/dL (ref >39)
LDL Calculated: 52 mg/dL (ref 0–99)
Triglycerides: 150 mg/dL — ABNORMAL HIGH (ref 0–149)
VLDL Cholesterol Cal: 30 mg/dL (ref 5–40)

## 2018-11-15 ENCOUNTER — Telehealth: Payer: Self-pay | Admitting: Family Medicine

## 2018-11-15 ENCOUNTER — Encounter: Payer: Self-pay | Admitting: Family Medicine

## 2018-11-15 LAB — URINE CULTURE

## 2018-11-15 NOTE — Assessment & Plan Note (Signed)
Under good control on current regimen. Continue current regimen. Continue to monitor. Call with any concerns. Refills given. Labs checked today.  

## 2018-11-15 NOTE — Assessment & Plan Note (Signed)
Stable. Son's murder trial has been pushed off again, causing increased stress. Continue current regimen. Less sedation on current regimen. Continue to monitor. Recheck 3 months.

## 2018-11-15 NOTE — Progress Notes (Signed)
BP 139/76   Pulse 100   Temp 99 F (37.2 C) (Oral)   Wt 217 lb (98.4 kg)   SpO2 92%   BMI 36.11 kg/m    Subjective:    Patient ID: Dawn Moore, female    DOB: 1941-01-20, 77 y.o.   MRN: [id number]  HPI: Dawn Moore is a 77 y.o. female  Chief Complaint  Patient presents with  . Depression  . Diabetes  . Hyperlipidemia  . Hypertension   HYPERTENSION / [place] Satisfied with current treatment? yes Duration of hypertension: chronic BP monitoring frequency: not checking BP medication side effects: no Duration of hyperlipidemia: chronic Cholesterol medication side effects: no Cholesterol supplements: none Past cholesterol medications: simvastatin Medication compliance: excellent compliance Aspirin: no Recent stressors: yes Recurrent headaches: no Visual changes: no Palpitations: no Dyspnea: no Chest pain: no Lower extremity edema: no Dizzy/lightheaded: no  DIABETES Hypoglycemic episodes:no Polydipsia/polyuria: no Visual disturbance: no Chest pain: no Paresthesias: no Glucose Monitoring: no             Accucheck frequency: Not Checking Taking Insulin?: no Blood Pressure Monitoring: not checking Retinal Examination: Not up to Date Foot Exam: Up to Date Diabetic Education: Completed Pneumovax: Up to Date Influenza: Up to Date Aspirin: yes  ANXIETY/STRESS- they put the trial off for her son's murder again. Has been under a lot stress.- taking her xanax every day, occasionally a couple of times a day Duration:exacerbated Anxious mood: yes  Excessive worrying: yes Irritability: yes  Sweating: yes Nausea: yes Palpitations:yes Hyperventilation: yes Panic attacks: yes Agoraphobia: no  Obscessions/compulsions: no Depressed mood: yes Depression screen [facility] 2/9 07/22/2018 04/25/2018 01/24/2018 10/24/2017 07/23/2017  Decreased Interest 2 2 1 3 3   Down, Depressed, Hopeless 2 3 1 3 3   PHQ - 2 Score 4 5 2 6 6   Altered sleeping 3 3 1 3 3   Tired,  decreased energy 3 2 1 3 3   Change in appetite 2 1 1 2 3   Feeling bad or failure about yourself  2 2 0 2 2  Trouble concentrating 2 2 1 3 3   Moving slowly or fidgety/restless 0 0 0 3 3  Suicidal thoughts 0 0 0 0 2  PHQ-9 Score 16 15 6 22 25   Difficult doing work/chores Very difficult Somewhat difficult Somewhat difficult Very difficult -  Some recent data might be hidden   GAD 7 : Generalized Anxiety Score 07/22/2018 01/24/2018 07/23/2017 02/27/2016  Nervous, Anxious, on Edge 3 1 3 3   Control/stop worrying 3 1 3 3   Worry too much - different things 3 1 3 3   Trouble relaxing 3 1 3 1   Restless 3 0 3 1  Easily annoyed or irritable 3 1 3 3   Afraid - awful might happen 3 1 2 2   Total GAD 7 Score 21 6 20 16   Anxiety Difficulty Somewhat difficult Somewhat difficult Extremely difficult Somewhat difficult   Anhedonia: yes Weight changes: no Insomnia: yes hard to fall asleep  Hypersomnia: yes Fatigue/loss of energy: yes Feelings of worthlessness: yes Feelings of guilt: yes Impaired concentration/indecisiveness: yes Suicidal ideations: no  Crying spells: yes Recent Stressors/Life Changes: yes   Relationship problems: no   Family stress: yes     Financial stress: yes    Job stress: no    Recent death/loss: yes  Relevant past medical, surgical, family and social history reviewed and updated as indicated. Interim medical history since our last visit reviewed. Allergies and medications reviewed and updated.  Review of Systems  Constitutional: Negative.   Respiratory: Negative.   Cardiovascular: Negative.   Gastrointestinal: Negative.   Musculoskeletal: Negative.   Neurological: Negative.   Psychiatric/Behavioral: Positive for dysphoric mood and sleep disturbance. Negative for agitation, behavioral problems, confusion, decreased concentration, hallucinations, self-injury and suicidal ideas. The patient is nervous/anxious. The patient is not hyperactive.     Per HPI unless specifically  indicated above     Objective:    BP 139/76   Pulse 100   Temp 99 F (37.2 C) (Oral)   Wt 217 lb (98.4 kg)   SpO2 92%   BMI 36.11 kg/m   Wt Readings from Last 3 Encounters:  11/13/18 217 lb (98.4 kg)  07/22/18 222 lb 8 oz (100.9 kg)  04/25/18 225 lb 4 oz (102.2 kg)    Physical Exam  Constitutional: She is oriented to person, place, and time. She appears well-developed and well-nourished. No distress.  HENT:  Head: Normocephalic and atraumatic.  Right Ear: Hearing normal.  Left Ear: Hearing normal.  Nose: Nose normal.  Eyes: Conjunctivae and lids are normal. Right eye exhibits no discharge. Left eye exhibits no discharge. No scleral icterus.  Cardiovascular: Normal rate, regular rhythm, normal heart sounds and intact distal pulses. Exam reveals no gallop and no friction rub.  No murmur heard. Pulmonary/Chest: Effort normal and breath sounds normal. No stridor. No respiratory distress. She has no wheezes. She has no rales. She exhibits no tenderness.  Musculoskeletal: Normal range of motion.  Neurological: She is alert and oriented to person, place, and time.  Skin: Skin is warm, dry and intact. Capillary refill takes less than 2 seconds. Rash (red, erythematous rash in groin and between legs) noted. She is not diaphoretic. No erythema. No pallor.  Psychiatric: She has a normal mood and affect. Her speech is normal and behavior is normal. Judgment and thought content normal. Cognition and memory are normal.  Nursing note and vitals reviewed.   Results for orders placed or performed in visit on 11/13/18  Urine Culture  Result Value Ref Range   Urine Culture, Routine Final report    Organism ID, Bacteria Comment   Microscopic Examination  Result Value Ref Range   WBC, UA 6-10 (A) 0 - 5 /hpf   RBC, UA 0-2 0 - 2 /hpf   Epithelial Cells (non renal) 0-10 0 - 10 /hpf   Renal Epithel, UA 0-10 (A) None seen /hpf   Bacteria, UA Few None seen/Few  CBC with Differential/Platelet    Result Value Ref Range   WBC 5.5 3.4 - 10.8 x10E3/uL   RBC 4.44 3.77 - 5.28 x10E6/uL   Hemoglobin 12.0 11.1 - 15.9 g/dL   Hematocrit 37.3 34.0 - 46.6 %   MCV 84 79 - 97 fL   MCH 27.0 26.6 - 33.0 pg   MCHC 32.2 31.5 - 35.7 g/dL   RDW 15.5 (H) 12.3 - 15.4 %   Platelets 151 150 - 450 x10E3/uL   Neutrophils 64 Not Estab. %   Lymphs 29 Not Estab. %   Monocytes 6 Not Estab. %   Eos 1 Not Estab. %   Basos 0 Not Estab. %   Neutrophils Absolute 3.5 1.4 - 7.0 x10E3/uL   Lymphocytes Absolute 1.6 0.7 - 3.1 x10E3/uL   Monocytes Absolute 0.3 0.1 - 0.9 x10E3/uL   EOS (ABSOLUTE) 0.1 0.0 - 0.4 x10E3/uL   Basophils Absolute 0.0 0.0 - 0.2 x10E3/uL   Immature Granulocytes 0 Not Estab. %   Immature Grans (Abs) 0.0 0.0 - 0.1  x10E3/uL  Bayer DCA Hb A1c Waived  Result Value Ref Range   HB A1C (BAYER DCA - WAIVED) 6.5 <7.0 %  Comprehensive metabolic panel  Result Value Ref Range   Glucose 283 (H) 65 - 99 mg/dL   BUN 11 8 - 27 mg/dL   Creatinine, Ser 0.99 0.57 - 1.00 mg/dL   GFR calc non Af Amer 56 (L) >59 mL/min/1.73   GFR calc Af Amer 64 >59 mL/min/1.73   BUN/Creatinine Ratio 11 (L) 12 - 28   Sodium 136 134 - 144 mmol/L   Potassium 4.3 3.5 - 5.2 mmol/L   Chloride 100 96 - 106 mmol/L   CO2 20 20 - 29 mmol/L   Calcium 9.1 8.7 - 10.3 mg/dL   Total Protein 6.8 6.0 - 8.5 g/dL   Albumin 3.9 3.5 - 4.8 g/dL   Globulin, Total 2.9 1.5 - 4.5 g/dL   Albumin/Globulin Ratio 1.3 1.2 - 2.2   Bilirubin Total 0.5 0.0 - 1.2 mg/dL   Alkaline Phosphatase 115 39 - 117 IU/L   AST 49 (H) 0 - 40 IU/L   ALT 30 0 - 32 IU/L  Lipid Panel w/o Chol/HDL Ratio  Result Value Ref Range   Cholesterol, Total 122 100 - 199 mg/dL   Triglycerides 150 (H) 0 - 149 mg/dL   HDL 40 >39 mg/dL   VLDL Cholesterol Cal 30 5 - 40 mg/dL   LDL Calculated 52 0 - 99 mg/dL  Microalbumin, Urine Waived  Result Value Ref Range   Microalb, Ur Waived 80 (H) 0 - 19 mg/L   Creatinine, Urine Waived 100 10 - 300 mg/dL   Microalb/Creat Ratio  30-300 (H) <30 mg/g  UA/M w/rflx Culture, Routine  Result Value Ref Range   Specific Gravity, UA 1.020 1.005 - 1.030   pH, UA 6.5 5.0 - 7.5   Color, UA Yellow Yellow   Appearance Ur Cloudy (A) Clear   Leukocytes, UA 1+ (A) Negative   Protein, UA Trace (A) Negative/Trace   Glucose, UA Negative Negative   Ketones, UA Trace (A) Negative   RBC, UA Negative Negative   Bilirubin, UA Negative Negative   Urobilinogen, Ur 1.0 0.2 - 1.0 mg/dL   Nitrite, UA Negative Negative   Microscopic Examination See below:       Assessment & Plan:   Problem List Items Addressed This Visit      Cardiovascular and Mediastinum   HTN (hypertension)    Under good control on current regimen. Continue current regimen. Continue to monitor. Call with any concerns. Refills given. Labs checked today.        Relevant Medications   simvastatin (ZOCOR) 10 MG tablet   Other Relevant Orders   CBC with Differential/Platelet (Completed)   Comprehensive metabolic panel (Completed)   Microalbumin, Urine Waived (Completed)   UA/M w/rflx Culture, Routine (Completed)     Endocrine   Diabetes mellitus with renal manifestations, controlled ([place]) - Primary    Under good control with A1c of 6.5- will continue metformin, if keeps dropping down, will consider stopping it. Continue to monitor. Recheck 3-6 months.       Relevant Medications   metFORMIN (GLUCOPHAGE) 500 MG tablet   simvastatin (ZOCOR) 10 MG tablet   Other Relevant Orders   CBC with Differential/Platelet (Completed)   Bayer DCA Hb A1c Waived (Completed)   Comprehensive metabolic panel (Completed)   UA/M w/rflx Culture, Routine (Completed)   Diabetic neuropathy (HCC)    Under good control with A1c of 6.5-  will continue metformin, if keeps dropping down, will consider stopping it. Continue to monitor. Recheck 3-6 months.       Relevant Medications   metFORMIN (GLUCOPHAGE) 500 MG tablet   simvastatin (ZOCOR) 10 MG tablet   Other Relevant Orders   CBC  with Differential/Platelet (Completed)   Comprehensive metabolic panel (Completed)   Microalbumin, Urine Waived (Completed)   UA/M w/rflx Culture, Routine (Completed)     Musculoskeletal and Integument   Candidal intertrigo    Not going away with nystatin, diflucan, or other topicals- will refer to dermatology for further evaluation.         Other   Anxiety disorder    Stable. Son's murder trial has been pushed off again, causing increased stress. Continue current regimen. Less sedation on current regimen. Continue to monitor. Recheck 3 months.       Relevant Medications   venlafaxine XR (EFFEXOR-XR) 150 MG 24 hr capsule   venlafaxine XR (EFFEXOR-XR) 75 MG 24 hr capsule   ALPRAZolam (XANAX) 1 MG tablet   ALPRAZolam (XANAX) 1 MG tablet (Start on 12/13/2018)   Other Relevant Orders   CBC with Differential/Platelet (Completed)   Comprehensive metabolic panel (Completed)   UA/M w/rflx Culture, Routine (Completed)   Thrombocytopenia (HCC)    Rechecking levels today. Await resutls.       Relevant Orders   CBC with Differential/Platelet (Completed)   Comprehensive metabolic panel (Completed)   UA/M w/rflx Culture, Routine (Completed)   Depression    Stable. Son's murder trial has been pushed off again, causing increased stress. Continue current regimen. Less sedation on current regimen. Continue to monitor. Recheck 3 months.       Relevant Medications   venlafaxine XR (EFFEXOR-XR) 150 MG 24 hr capsule   venlafaxine XR (EFFEXOR-XR) 75 MG 24 hr capsule   ALPRAZolam (XANAX) 1 MG tablet   ALPRAZolam (XANAX) 1 MG tablet (Start on 12/13/2018)   Other Relevant Orders   CBC with Differential/Platelet (Completed)   Comprehensive metabolic panel (Completed)   UA/M w/rflx Culture, Routine (Completed)   Hyperlipidemia    Under good control on current regimen. Continue current regimen. Continue to monitor. Call with any concerns. Refills given. Labs checked today.        Relevant  Medications   simvastatin (ZOCOR) 10 MG tablet   Other Relevant Orders   CBC with Differential/Platelet (Completed)   Comprehensive metabolic panel (Completed)   Lipid Panel w/o Chol/HDL Ratio (Completed)   UA/M w/rflx Culture, Routine (Completed)    Other Visit Diagnoses    Need for influenza vaccination       Flu shot given today.   Relevant Orders   Flu vaccine HIGH DOSE PF (Completed)   Rash       Thought to be candida interigo. Not going away with nystatin, diflucan, or other topicals- will refer to dermatology for further evaluation.    Relevant Orders   Ambulatory referral to Dermatology   Pyuria       Await urine culture.    Relevant Orders   Urine Culture (Completed)       Follow up plan: Return in about 3 months (around 02/13/2019) for Follow up anxiety.

## 2018-11-15 NOTE — Assessment & Plan Note (Signed)
Rechecking levels today. Await resutls.

## 2018-11-15 NOTE — Assessment & Plan Note (Signed)
Under good control with A1c of 6.5- will continue metformin, if keeps dropping down, will consider stopping it. Continue to monitor. Recheck 3-6 months.

## 2018-11-15 NOTE — Telephone Encounter (Signed)
Please let her know no sign of UTI. Thanks!

## 2018-11-15 NOTE — Assessment & Plan Note (Signed)
Not going away with nystatin, diflucan, or other topicals- will refer to dermatology for further evaluation.

## 2018-12-08 ENCOUNTER — Telehealth: Payer: Self-pay | Admitting: Family Medicine

## 2018-12-08 NOTE — Telephone Encounter (Signed)
Dawn Moore presented in office to see about getting a prescription for her mother [name]. States that [place] is unable to use the bathroom, the prescription that she wants goes in her drink. Please advise.

## 2018-12-08 NOTE — Telephone Encounter (Signed)
I don't know what she's talking about- can you reach out to her and try to figure out what she's talking about?

## 2018-12-09 MED ORDER — POLYETHYLENE GLYCOL 3350 17 GM/SCOOP PO POWD: 17.0000 g | Freq: Two times a day (BID) | ORAL | 12 refills | Status: DC | PRN

## 2018-12-09 NOTE — Telephone Encounter (Signed)
Rx sent to her [facility]) Let me know if there's any issues.

## 2018-12-09 NOTE — Telephone Encounter (Signed)
Patient/family is wanting the MiraLAX mix-in pax that they can just add to liquid.

## 2018-12-16 ENCOUNTER — Other Ambulatory Visit: Payer: Self-pay | Admitting: Family Medicine

## 2019-01-05 ENCOUNTER — Other Ambulatory Visit: Payer: Self-pay | Admitting: Family Medicine

## 2019-01-05 NOTE — Telephone Encounter (Signed)
Copied from [place] [id number]. Topic: Quick Communication - Rx Refill/Question >> Jan 05, 2019  3:23 PM Reyne Dumas L wrote: Medication:  ALPRAZolam Duanne Moron) 1 MG tablet polyethylene glycol powder (GLYCOLAX/MIRALAX) powder  Has the patient contacted their pharmacy? Pharmacy calling for pt she is trying to switch to a different pharmacy but they can not get records from the prior pharmacy that closed (Agent: If no, request that the patient contact the pharmacy for the refill.) (Agent: If yes, when and what did the pharmacy advise?)  Preferred Pharmacy (with phone number or street name):[place], [place] - [place] [phone] (Phone) [phone] (Fax)  Agent: Please be advised that RX refills may take up to 3 business days. We ask that you follow-up with your pharmacy.

## 2019-01-06 MED ORDER — POLYETHYLENE GLYCOL 3350 17 GM/SCOOP PO POWD: 17.0000 g | Freq: Two times a day (BID) | ORAL | 11 refills | Status: DC | PRN

## 2019-01-06 NOTE — Telephone Encounter (Signed)
Requested medication (s) are due for refill today: yes  Requested medication (s) are on the active medication list: yes  Last refill:  12/13/18 # 45  Future visit scheduled: yes  Notes to clinic:  Unable to refill per protocol.     Requested Prescriptions  Pending Prescriptions Disp Refills   ALPRAZolam (XANAX) 1 MG tablet 45 tablet 0    Sig: Take 1 tablet (1 mg total) by mouth 2 (two) times daily as needed for anxiety.     Not Delegated - Psychiatry:  Anxiolytics/Hypnotics Failed - 01/06/2019 10:44 AM      Failed - This refill cannot be delegated      Failed - Urine Drug Screen completed in last 360 days.      Passed - Valid encounter within last 6 months    Recent Outpatient Visits          1 month ago Controlled type 2 diabetes mellitus with stage 2 chronic kidney disease, without long-term current use of insulin ([place])   [place], Megan P, DO   5 months ago Controlled type 2 diabetes mellitus with stage 2 chronic kidney disease, without long-term current use of insulin ([place])   [place], Megan P, DO   8 months ago Routine general medical examination at a health care facility   [facility], [place] P, DO   11 months ago Controlled type 2 diabetes mellitus with stage 2 chronic kidney disease, without long-term current use of insulin ([place])   [place], [place], DO   1 year ago Memory loss   [facility], [place], DO      Future Appointments            In 3 days [place], Megan P, DO [facility]         Signed Prescriptions Disp Refills   polyethylene glycol powder (GLYCOLAX/MIRALAX) powder 3350 g 11    Sig: Take 17 g by mouth 2 (two) times daily as needed.     Gastroenterology:  Laxatives Passed - 01/06/2019 10:44 AM      Passed - Valid encounter within last 12 months    Recent Outpatient Visits          1 month ago Controlled type 2 diabetes  mellitus with stage 2 chronic kidney disease, without long-term current use of insulin ([place])   [facility], Megan P, DO   5 months ago Controlled type 2 diabetes mellitus with stage 2 chronic kidney disease, without long-term current use of insulin ([place])   [place], Megan P, DO   8 months ago Routine general medical examination at a health care facility   [facility], [place] P, DO   11 months ago Controlled type 2 diabetes mellitus with stage 2 chronic kidney disease, without long-term current use of insulin ([place])   [place], [place], DO   1 year ago Memory loss   [place], [place], DO      Future Appointments            In 3 days Wynetta Emery, Barb Merino, DO [facility]

## 2019-01-09 ENCOUNTER — Encounter: Payer: Medicare HMO | Admitting: Family Medicine

## 2019-01-09 ENCOUNTER — Encounter: Payer: Self-pay | Admitting: Family Medicine

## 2019-01-09 ENCOUNTER — Other Ambulatory Visit: Payer: Self-pay

## 2019-01-09 MED ORDER — ALPRAZOLAM 1 MG PO TABS: 1.0000 mg | ORAL_TABLET | Freq: Two times a day (BID) | ORAL | 0 refills | Status: AC | PRN

## 2019-01-09 MED ORDER — ALPRAZOLAM 1 MG PO TABS: 1.0000 mg | ORAL_TABLET | Freq: Two times a day (BID) | ORAL | 0 refills | Status: DC | PRN

## 2019-01-09 NOTE — Progress Notes (Signed)
Appointment cancelled today as she is not due to be seen until next month. To be seen next month.  This encounter was created in error - please disregard.

## 2019-01-12 ENCOUNTER — Telehealth: Payer: Self-pay | Admitting: Family Medicine

## 2019-01-12 NOTE — Telephone Encounter (Signed)
Copied from [place] [id number]. Topic: Quick Communication - Rx Refill/Question >> Jan 12, 2019  1:14 PM Reyne Dumas L wrote: Medication: ALPRAZolam Duanne Moron) 1 MG tablet  Has the patient contacted their pharmacy? Yes - states this was sent to the wrong pharmacy.  Pt needs it sent to [place]. (Agent: If no, request that the patient contact the pharmacy for the refill.) (Agent: If yes, when and what did the pharmacy advise?)  Preferred Pharmacy (with phone number or street name): [place], [place] - [place] [phone] (Phone) [phone] (Fax)  Agent: Please be advised that RX refills may take up to 3 business days. We ask that you follow-up with your pharmacy.

## 2019-01-12 NOTE — Telephone Encounter (Signed)
Rx sent over on 01/09/19- please confirm receipt by pharmacy. Should not be due.

## 2019-01-12 NOTE — Telephone Encounter (Signed)
Rx's went to [facility]. I cancelled RX at [facility]

## 2019-01-12 NOTE — Telephone Encounter (Signed)
Medication called in to pharmacy.

## 2019-01-21 DIAGNOSIS — L719 Rosacea, unspecified: Secondary | ICD-10-CM | POA: Diagnosis not present

## 2019-02-13 ENCOUNTER — Other Ambulatory Visit: Payer: Self-pay

## 2019-02-13 ENCOUNTER — Ambulatory Visit (INDEPENDENT_AMBULATORY_CARE_PROVIDER_SITE_OTHER): Payer: Medicare HMO | Admitting: Family Medicine

## 2019-02-13 ENCOUNTER — Encounter: Payer: Self-pay | Admitting: Family Medicine

## 2019-02-13 VITALS — BP 143/78 | HR 85 | Temp 98.1°F | Ht 65.0 in | Wt 210.0 lb

## 2019-02-13 DIAGNOSIS — F411 Generalized anxiety disorder: Secondary | ICD-10-CM | POA: Diagnosis not present

## 2019-02-13 DIAGNOSIS — N182 Chronic kidney disease, stage 2 (mild): Secondary | ICD-10-CM

## 2019-02-13 DIAGNOSIS — E1122 Type 2 diabetes mellitus with diabetic chronic kidney disease: Secondary | ICD-10-CM | POA: Diagnosis not present

## 2019-02-13 LAB — BAYER DCA HB A1C WAIVED: HB A1C: 7.1 % — AB (ref <7.0)

## 2019-02-13 MED ORDER — ALPRAZOLAM 1 MG PO TABS: 1.0000 mg | ORAL_TABLET | Freq: Two times a day (BID) | ORAL | 0 refills | Status: DC | PRN

## 2019-02-13 MED ORDER — ALPRAZOLAM 1 MG PO TABS: 1.0000 mg | ORAL_TABLET | Freq: Two times a day (BID) | ORAL | 0 refills | Status: AC | PRN

## 2019-02-13 MED ORDER — METFORMIN HCL 500 MG PO TABS: 1000.0000 mg | ORAL_TABLET | Freq: Every day | ORAL | 1 refills | Status: DC

## 2019-02-13 NOTE — Progress Notes (Signed)
BP (!) 143/78   Pulse 85   Temp 98.1 F (36.7 C) (Oral)   Ht 5\' 5"  (1.651 m)   Wt 210 lb (95.3 kg)   SpO2 94%   BMI 34.95 kg/m    Subjective:    Patient ID: Dawn Moore, female    DOB: 1941/01/20, 78 y.o.   MRN: [id number]  HPI: Dawn Moore is a 78 y.o. female  Chief Complaint  Patient presents with  . Anxiety    20mf/u  . Depression   ANXIETY/STRESS- son's trial is scheduled for the next couple of months. Has not been feeling great, but pretty stable.  Duration:stable Anxious mood: yes  Excessive worrying: yes Irritability: yes  Sweating: no Nausea: no Palpitations:no Hyperventilation: no Panic attacks: no Agoraphobia: no  Obscessions/compulsions: no Depressed mood: yes Depression screen [facility] 2/9 02/13/2019 07/22/2018 04/25/2018 01/24/2018 10/24/2017  Decreased Interest 2 2 2 1 3   Down, Depressed, Hopeless 2 2 3 1 3   PHQ - 2 Score 4 4 5 2 6   Altered sleeping 2 3 3 1 3   Tired, decreased energy 2 3 2 1 3   Change in appetite 1 2 1 1 2   Feeling bad or failure about yourself  1 2 2  0 2  Trouble concentrating 3 2 2 1 3   Moving slowly or fidgety/restless 0 0 0 0 3  Suicidal thoughts 0 0 0 0 0  PHQ-9 Score 13 16 15 6 22   Difficult doing work/chores - Very difficult Somewhat difficult Somewhat difficult Very difficult  Some recent data might be hidden   GAD 7 : Generalized Anxiety Score 02/13/2019 01/09/2019 07/22/2018 01/24/2018  Nervous, Anxious, on Edge 2 2 3 1   Control/stop worrying 3 3 3 1   Worry too much - different things 3 3 3 1   Trouble relaxing 1 2 3 1   Restless 1 1 3  0  Easily annoyed or irritable 3 2 3 1   Afraid - awful might happen 0 2 3 1   Total GAD 7 Score 13 15 21 6   Anxiety Difficulty Somewhat difficult - Somewhat difficult Somewhat difficult   Anhedonia: no Weight changes: no Insomnia: no   Hypersomnia: no Fatigue/loss of energy: yes Feelings of worthlessness: no Feelings of guilt: yes Impaired concentration/indecisiveness: no Suicidal  ideations: no  Crying spells: no Recent Stressors/Life Changes: yes   Relationship problems: no   Family stress: yes     Financial stress: no    Job stress: no    Recent death/loss: no  DIABETES Hypoglycemic episodes:no Polydipsia/polyuria: no Visual disturbance: no Chest pain: no Paresthesias: no Glucose Monitoring: no  Accucheck frequency: Not Checking Taking Insulin?: no Blood Pressure Monitoring: not checking Retinal Examination: Not up to Date Foot Exam: Up to Date Diabetic Education: Completed Pneumovax: Up to Date Influenza: Up to Date Aspirin: yes  Relevant past medical, surgical, family and social history reviewed and updated as indicated. Interim medical history since our last visit reviewed. Allergies and medications reviewed and updated.  Review of Systems  Constitutional: Negative.   Respiratory: Negative.   Cardiovascular: Negative.   Musculoskeletal: Negative.   Skin: Negative.   Neurological: Negative.   Psychiatric/Behavioral: Positive for dysphoric mood. Negative for agitation, behavioral problems, confusion, decreased concentration, hallucinations, self-injury, sleep disturbance and suicidal ideas. The patient is nervous/anxious. The patient is not hyperactive.     Per HPI unless specifically indicated above     Objective:    BP (!) 143/78   Pulse 85   Temp 98.1 F (36.7  C) (Oral)   Ht 5\' 5"  (1.651 m)   Wt 210 lb (95.3 kg)   SpO2 94%   BMI 34.95 kg/m   Wt Readings from Last 3 Encounters:  02/13/19 210 lb (95.3 kg)  01/09/19 214 lb (97.1 kg)  11/13/18 217 lb (98.4 kg)    Physical Exam Vitals signs and nursing note reviewed.  Constitutional:      General: She is not in acute distress.    Appearance: Normal appearance. She is not ill-appearing, toxic-appearing or diaphoretic.  HENT:     Head: Normocephalic and atraumatic.     Right Ear: External ear normal.     Left Ear: External ear normal.     Nose: Nose normal.     Mouth/Throat:       Mouth: Mucous membranes are moist.     Pharynx: Oropharynx is clear.  Eyes:     General: No scleral icterus.       Right eye: No discharge.        Left eye: No discharge.     Extraocular Movements: Extraocular movements intact.     Conjunctiva/sclera: Conjunctivae normal.     Pupils: Pupils are equal, round, and reactive to light.  Neck:     Musculoskeletal: Normal range of motion and neck supple.  Cardiovascular:     Rate and Rhythm: Normal rate and regular rhythm.     Pulses: Normal pulses.     Heart sounds: Normal heart sounds. No murmur. No friction rub. No gallop.   Pulmonary:     Effort: Pulmonary effort is normal. No respiratory distress.     Breath sounds: Normal breath sounds. No stridor. No wheezing, rhonchi or rales.  Chest:     Chest wall: No tenderness.  Musculoskeletal: Normal range of motion.  Skin:    General: Skin is warm and dry.     Capillary Refill: Capillary refill takes less than 2 seconds.     Coloration: Skin is not jaundiced or pale.     Findings: No bruising, erythema, lesion or rash.  Neurological:     General: No focal deficit present.     Mental Status: She is alert and oriented to person, place, and time. Mental status is at baseline.  Psychiatric:        Mood and Affect: Mood normal.        Behavior: Behavior normal.        Thought Content: Thought content normal.        Judgment: Judgment normal.     Results for orders placed or performed in visit on 02/13/19  Bayer DCA Hb A1c Waived  Result Value Ref Range   HB A1C (BAYER DCA - WAIVED) 7.1 (H) <7.0 %      Assessment & Plan:   Problem List Items Addressed This Visit      Endocrine   Diabetes mellitus with renal manifestations, controlled ([place]) - Primary    Going in the wrong direction with A1c of 7.1, up from 6.5- will increase her metformin to 1000mg  QAM and recheck in 3 months. Call with any concerns.       Relevant Medications   metFORMIN (GLUCOPHAGE) 500 MG tablet   Other  Relevant Orders   Bayer DCA Hb A1c Waived (Completed)     Other   Anxiety disorder    Stable. Son's murder trial is scheduled for the next couple of months- adding extra stress, but staying stable. Will continue current regimen with 3 month supply given today.  Call with any concerns. Recheck 3 months unless needs to be seen earlier due to trial.       Relevant Medications   ALPRAZolam (XANAX) 1 MG tablet   ALPRAZolam (XANAX) 1 MG tablet (Start on 03/15/2019)   ALPRAZolam (XANAX) 1 MG tablet (Start on 04/14/2019)       Follow up plan: Return in about 3 months (around 05/14/2019) for DM and mood follow up.

## 2019-02-15 ENCOUNTER — Encounter: Payer: Self-pay | Admitting: Family Medicine

## 2019-02-15 NOTE — Assessment & Plan Note (Signed)
Stable. Son's murder trial is scheduled for the next couple of months- adding extra stress, but staying stable. Will continue current regimen with 3 month supply given today. Call with any concerns. Recheck 3 months unless needs to be seen earlier due to trial.

## 2019-02-15 NOTE — Assessment & Plan Note (Signed)
Going in the wrong direction with A1c of 7.1, up from 6.5- will increase her metformin to 1000mg  QAM and recheck in 3 months. Call with any concerns.

## 2019-02-20 LAB — HM DIABETES EYE EXAM

## 2019-02-23 DIAGNOSIS — Z0100 Encounter for examination of eyes and vision without abnormal findings: Secondary | ICD-10-CM | POA: Diagnosis not present

## 2019-02-23 DIAGNOSIS — H524 Presbyopia: Secondary | ICD-10-CM | POA: Diagnosis not present

## 2019-05-14 ENCOUNTER — Ambulatory Visit: Payer: Self-pay | Admitting: Family Medicine

## 2019-05-18 ENCOUNTER — Encounter: Payer: Self-pay | Admitting: Family Medicine

## 2019-05-18 ENCOUNTER — Ambulatory Visit (INDEPENDENT_AMBULATORY_CARE_PROVIDER_SITE_OTHER): Payer: Medicare HMO | Admitting: Family Medicine

## 2019-05-18 ENCOUNTER — Other Ambulatory Visit: Payer: Self-pay

## 2019-05-18 VITALS — BP 134/83 | HR 84 | Temp 98.1°F | Ht 66.0 in | Wt 218.5 lb

## 2019-05-18 DIAGNOSIS — N182 Chronic kidney disease, stage 2 (mild): Secondary | ICD-10-CM | POA: Diagnosis not present

## 2019-05-18 DIAGNOSIS — N3000 Acute cystitis without hematuria: Secondary | ICD-10-CM

## 2019-05-18 DIAGNOSIS — E782 Mixed hyperlipidemia: Secondary | ICD-10-CM | POA: Diagnosis not present

## 2019-05-18 DIAGNOSIS — I10 Essential (primary) hypertension: Secondary | ICD-10-CM

## 2019-05-18 DIAGNOSIS — F411 Generalized anxiety disorder: Secondary | ICD-10-CM

## 2019-05-18 DIAGNOSIS — D696 Thrombocytopenia, unspecified: Secondary | ICD-10-CM

## 2019-05-18 DIAGNOSIS — R300 Dysuria: Secondary | ICD-10-CM

## 2019-05-18 DIAGNOSIS — E1122 Type 2 diabetes mellitus with diabetic chronic kidney disease: Secondary | ICD-10-CM | POA: Diagnosis not present

## 2019-05-18 MED ORDER — ALPRAZOLAM 1 MG PO TABS: 1.0000 mg | ORAL_TABLET | Freq: Two times a day (BID) | ORAL | 0 refills | Status: AC | PRN

## 2019-05-18 MED ORDER — NITROFURANTOIN MONOHYD MACRO 100 MG PO CAPS: 100.0000 mg | ORAL_CAPSULE | Freq: Two times a day (BID) | ORAL | 0 refills | Status: DC

## 2019-05-18 MED ORDER — SIMVASTATIN 10 MG PO TABS: 10.0000 mg | ORAL_TABLET | Freq: Every evening | ORAL | 1 refills | Status: DC

## 2019-05-18 MED ORDER — METFORMIN HCL 500 MG PO TABS: 1000.0000 mg | ORAL_TABLET | Freq: Every day | ORAL | 1 refills | Status: DC

## 2019-05-18 MED ORDER — VENLAFAXINE HCL ER 150 MG PO CP24: 150.0000 mg | ORAL_CAPSULE | Freq: Every day | ORAL | 1 refills | Status: DC

## 2019-05-18 MED ORDER — VENLAFAXINE HCL ER 75 MG PO CP24: ORAL_CAPSULE | ORAL | 1 refills | Status: DC

## 2019-05-18 NOTE — Assessment & Plan Note (Signed)
Better on recheck. Continue current regimen. Continue to monitor. Call with any concerns.  

## 2019-05-18 NOTE — Assessment & Plan Note (Signed)
Rechecking levels today. Await results. Call with any concerns.  

## 2019-05-18 NOTE — Assessment & Plan Note (Addendum)
Under good control on current regimen. Continue current regimen. Continue to monitor. Call with any concerns. Refills given for the next 3 months. Call with any concerns.

## 2019-05-18 NOTE — Progress Notes (Signed)
BP 134/83   Pulse 84   Temp 98.1 F (36.7 C) (Oral)   Ht 5\' 6"  (1.676 m)   Wt 218 lb 8 oz (99.1 kg)   SpO2 94%   BMI 35.27 kg/m    Subjective:    Patient ID: Dawn Moore, female    DOB: 1941/02/23, 78 y.o.   MRN: [id number]  HPI: Dawn Moore is a 78 y.o. female  Chief Complaint  Patient presents with  . Diabetes    42m f/u  . Anxiety  . Depression  . Fall    pt states that she had fallen at least 5-7 times within the last week   DIABETES Hypoglycemic episodes:no Polydipsia/polyuria: no Visual disturbance: no Chest pain: no Paresthesias: no Glucose Monitoring: no  Accucheck frequency: Not Checking Taking Insulin?: no Blood Pressure Monitoring: not checking Retinal Examination: Not Up to Date Foot Exam: Up to Date Diabetic Education: Completed Pneumovax: Up to Date Influenza: Up to Date Aspirin: yes  HYPERTENSION / HYPERLIPIDEMIA Satisfied with current treatment? yes Duration of hypertension: chronic BP monitoring frequency: not checking BP medication side effects: no Duration of hyperlipidemia: chronic Cholesterol medication side effects: no Cholesterol supplements: none Past cholesterol medications: simvastatin Medication compliance: excellent compliance Aspirin: no Recent stressors: yes Recurrent headaches: no Visual changes: no Palpitations: no Dyspnea: no Chest pain: no Lower extremity edema: no Dizzy/lightheaded: no  ANXIETY/DEPRESSION Duration:exacerbated Anxious mood: yes  Excessive worrying: yes Irritability: yes  Sweating: no Nausea: no Palpitations:no Hyperventilation: no Panic attacks: no Agoraphobia: no  Obscessions/compulsions: no Depressed mood: yes Depression screen [facility] 2/9 05/18/2019 02/13/2019 07/22/2018 04/25/2018 01/24/2018  Decreased Interest 3 2 2 2 1   Down, Depressed, Hopeless 3 2 2 3 1   PHQ - 2 Score 6 4 4 5 2   Altered sleeping 3 2 3 3 1   Tired, decreased energy 3 2 3 2 1   Change in appetite 0 1 2 1 1   Feeling bad  or failure about yourself  1 1 2 2  0  Trouble concentrating 2 3 2 2 1   Moving slowly or fidgety/restless 0 0 0 0 0  Suicidal thoughts 0 0 0 0 0  PHQ-9 Score 15 13 16 15 6   Difficult doing work/chores Very difficult - Very difficult Somewhat difficult Somewhat difficult  Some recent data might be hidden   GAD 7 : Generalized Anxiety Score 05/18/2019 02/13/2019 01/09/2019 07/22/2018  Nervous, Anxious, on Edge 3 2 2 3   Control/stop worrying 3 3 3 3   Worry too much - different things 3 3 3 3   Trouble relaxing 1 1 2 3   Restless 0 1 1 3   Easily annoyed or irritable 3 3 2 3   Afraid - awful might happen 0 0 2 3  Total GAD 7 Score 13 13 15 21   Anxiety Difficulty Somewhat difficult Somewhat difficult - Somewhat difficult   Anhedonia: yes Weight changes: no Insomnia: no   Hypersomnia: yes Fatigue/loss of energy: yes Feelings of worthlessness: yes Feelings of guilt: yes Impaired concentration/indecisiveness: yes Suicidal ideations: no  Crying spells: no Recent Stressors/Life Changes: yes   Relationship problems: no   Family stress: yes     Financial stress: yes    Job stress: no    Recent death/loss: yes  URINARY SYMPTOMS Duration: 1-2 weeks, falling and confused Dysuria: no Urinary frequency: no Urgency: no Small volume voids: no Symptom severity: mild Urinary incontinence: no Foul odor: yes Hematuria: no Abdominal pain: no Back pain: no Suprapubic pain/pressure: no Flank pain: no Fever:  no Vomiting: no Relief with cranberry juice: no Relief with pyridium: no Status: stable Previous urinary tract infection: yes Recurrent urinary tract infection: yes Sexual activity: monogomous History of sexually transmitted disease: no Vaginal discharge: no Treatments attempted: increasing fluids   Relevant past medical, surgical, family and social history reviewed and updated as indicated. Interim medical history since our last visit reviewed. Allergies and medications reviewed and  updated.  Review of Systems  Constitutional: Negative.   HENT: Negative.   Respiratory: Negative.   Cardiovascular: Negative.   Gastrointestinal: Negative.   Musculoskeletal: Positive for gait problem and myalgias. Negative for arthralgias, back pain, joint swelling, neck pain and neck stiffness.  Skin: Negative.   Allergic/Immunologic: Negative.   Hematological: Negative.   Psychiatric/Behavioral: Positive for agitation and dysphoric mood. Negative for behavioral problems, confusion, decreased concentration, hallucinations, self-injury, sleep disturbance and suicidal ideas. The patient is nervous/anxious. The patient is not hyperactive.     Per HPI unless specifically indicated above     Objective:    BP 134/83   Pulse 84   Temp 98.1 F (36.7 C) (Oral)   Ht 5\' 6"  (1.676 m)   Wt 218 lb 8 oz (99.1 kg)   SpO2 94%   BMI 35.27 kg/m   Wt Readings from Last 3 Encounters:  05/18/19 218 lb 8 oz (99.1 kg)  02/13/19 210 lb (95.3 kg)  01/09/19 214 lb (97.1 kg)    Physical Exam Vitals signs and nursing note reviewed.  Constitutional:      General: She is not in acute distress.    Appearance: Normal appearance. She is not ill-appearing, toxic-appearing or diaphoretic.  HENT:     Head: Normocephalic and atraumatic.     Right Ear: External ear normal.     Left Ear: External ear normal.     Nose: Nose normal.     Mouth/Throat:     Mouth: Mucous membranes are moist.     Pharynx: Oropharynx is clear.  Eyes:     General: No scleral icterus.       Right eye: No discharge.        Left eye: No discharge.     Extraocular Movements: Extraocular movements intact.     Conjunctiva/sclera: Conjunctivae normal.     Pupils: Pupils are equal, round, and reactive to light.  Neck:     Musculoskeletal: Normal range of motion and neck supple.  Cardiovascular:     Rate and Rhythm: Normal rate and regular rhythm.     Pulses: Normal pulses.     Heart sounds: Normal heart sounds. No murmur. No  friction rub. No gallop.   Pulmonary:     Effort: Pulmonary effort is normal. No respiratory distress.     Breath sounds: Normal breath sounds. No stridor. No wheezing, rhonchi or rales.  Chest:     Chest wall: No tenderness.  Musculoskeletal: Normal range of motion.  Skin:    General: Skin is warm and dry.     Capillary Refill: Capillary refill takes less than 2 seconds.     Coloration: Skin is not jaundiced or pale.     Findings: No bruising, erythema, lesion or rash.  Neurological:     General: No focal deficit present.     Mental Status: She is alert and oriented to person, place, and time. Mental status is at baseline.  Psychiatric:        Mood and Affect: Mood normal.        Behavior: Behavior normal.  Thought Content: Thought content normal.        Judgment: Judgment normal.     Results for orders placed or performed in visit on 02/13/19  Bayer DCA Hb A1c Waived  Result Value Ref Range   HB A1C (BAYER DCA - WAIVED) 7.1 (H) <7.0 %      Assessment & Plan:   Problem List Items Addressed This Visit      Cardiovascular and Mediastinum   HTN (hypertension)    Better on recheck. Continue current regimen. Continue to monitor. Call with any concerns.       Relevant Medications   simvastatin (ZOCOR) 10 MG tablet   Other Relevant Orders   Comprehensive metabolic panel     Endocrine   Diabetes mellitus with renal manifestations, controlled ([place]) - Primary    Stable with A1c of 7.1. Has only been taking 1 of her metformin daily. Will increase metformin to 1000mg  daily and recheck in 3 months. Call with any concerns. Continue to monitor.       Relevant Medications   simvastatin (ZOCOR) 10 MG tablet   metFORMIN (GLUCOPHAGE) 500 MG tablet   Other Relevant Orders   Bayer DCA Hb A1c Waived   Comprehensive metabolic panel     Other   Anxiety disorder    Under good control on current regimen. Continue current regimen. Continue to monitor. Call with any concerns.  Refills given for the next 3 months. Call with any concerns.        Relevant Medications   ALPRAZolam (XANAX) 1 MG tablet   venlafaxine XR (EFFEXOR-XR) 75 MG 24 hr capsule   venlafaxine XR (EFFEXOR-XR) 150 MG 24 hr capsule   ALPRAZolam (XANAX) 1 MG tablet   ALPRAZolam (XANAX) 1 MG tablet (Start on 06/17/2019)   ALPRAZolam (XANAX) 1 MG tablet (Start on 07/17/2019)   Other Relevant Orders   Comprehensive metabolic panel   Thrombocytopenia (HCC)    Rechecking levels today. Await results. Call with any concerns.       Relevant Orders   Comprehensive metabolic panel   CBC with Differential/Platelet   Hyperlipidemia    Rechecking levels today. Await results. Call with any concerns.       Relevant Medications   simvastatin (ZOCOR) 10 MG tablet   Other Relevant Orders   Comprehensive metabolic panel   Lipid Panel w/o Chol/HDL Ratio    Other Visit Diagnoses    Dysuria       3+ Leuks- will treat and await culture.    Relevant Orders   UA/M w/rflx Culture, Routine   Acute cystitis without hematuria       Will treat with nitrifurantoin. Await culture. Call with any concerns.        Follow up plan: Return in about 3 months (around 08/18/2019) for Wellness/physical.

## 2019-05-18 NOTE — Assessment & Plan Note (Addendum)
Stable with A1c of 7.1. Has only been taking 1 of her metformin daily. Will increase metformin to 1000mg  daily and recheck in 3 months. Call with any concerns. Continue to monitor.

## 2019-05-19 ENCOUNTER — Telehealth: Payer: Self-pay | Admitting: Family Medicine

## 2019-05-19 ENCOUNTER — Encounter: Payer: Self-pay | Admitting: Family Medicine

## 2019-05-19 LAB — CBC WITH DIFFERENTIAL/PLATELET
Basophils Absolute: 0 10*3/uL (ref 0.0–0.2)
Basos: 1 %
EOS (ABSOLUTE): 0.1 10*3/uL (ref 0.0–0.4)
Eos: 2 %
Hematocrit: 42.1 % (ref 34.0–46.6)
Hemoglobin: 13.8 g/dL (ref 11.1–15.9)
Immature Grans (Abs): 0 10*3/uL (ref 0.0–0.1)
Immature Granulocytes: 0 %
Lymphocytes Absolute: 1.7 10*3/uL (ref 0.7–3.1)
Lymphs: 37 %
MCH: 28.6 pg (ref 26.6–33.0)
MCHC: 32.8 g/dL (ref 31.5–35.7)
MCV: 87 fL (ref 79–97)
Monocytes Absolute: 0.4 10*3/uL (ref 0.1–0.9)
Monocytes: 8 %
Neutrophils Absolute: 2.5 10*3/uL (ref 1.4–7.0)
Neutrophils: 52 %
Platelets: 146 10*3/uL — ABNORMAL LOW (ref 150–450)
RBC: 4.82 x10E6/uL (ref 3.77–5.28)
RDW: 15.3 % (ref 11.7–15.4)
WBC: 4.7 10*3/uL (ref 3.4–10.8)

## 2019-05-19 LAB — COMPREHENSIVE METABOLIC PANEL
ALT: 19 IU/L (ref 0–32)
AST: 33 IU/L (ref 0–40)
Albumin/Globulin Ratio: 1.6 (ref 1.2–2.2)
Albumin: 4.2 g/dL (ref 3.7–4.7)
Alkaline Phosphatase: 92 IU/L (ref 39–117)
BUN/Creatinine Ratio: 11 — ABNORMAL LOW (ref 12–28)
BUN: 10 mg/dL (ref 8–27)
Bilirubin Total: 0.6 mg/dL (ref 0.0–1.2)
CO2: 22 mmol/L (ref 20–29)
Calcium: 9.5 mg/dL (ref 8.7–10.3)
Chloride: 104 mmol/L (ref 96–106)
Creatinine, Ser: 0.89 mg/dL (ref 0.57–1.00)
GFR calc Af Amer: 73 mL/min/{1.73_m2} (ref >59)
GFR calc non Af Amer: 63 mL/min/{1.73_m2} (ref >59)
Globulin, Total: 2.6 g/dL (ref 1.5–4.5)
Glucose: 121 mg/dL — ABNORMAL HIGH (ref 65–99)
Potassium: 4.6 mmol/L (ref 3.5–5.2)
Sodium: 139 mmol/L (ref 134–144)
Total Protein: 6.8 g/dL (ref 6.0–8.5)

## 2019-05-19 LAB — LIPID PANEL W/O CHOL/HDL RATIO
Cholesterol, Total: 143 mg/dL (ref 100–199)
HDL: 55 mg/dL (ref >39)
LDL Calculated: 66 mg/dL (ref 0–99)
Triglycerides: 111 mg/dL (ref 0–149)
VLDL Cholesterol Cal: 22 mg/dL (ref 5–40)

## 2019-05-19 MED ORDER — METFORMIN HCL 500 MG PO TABS: 1000.0000 mg | ORAL_TABLET | Freq: Every day | ORAL | 1 refills | Status: DC

## 2019-05-19 MED ORDER — VENLAFAXINE HCL ER 150 MG PO CP24: 150.0000 mg | ORAL_CAPSULE | Freq: Every day | ORAL | 1 refills | Status: DC

## 2019-05-19 MED ORDER — SIMVASTATIN 10 MG PO TABS: 10.0000 mg | ORAL_TABLET | Freq: Every evening | ORAL | 1 refills | Status: DC

## 2019-05-19 MED ORDER — VENLAFAXINE HCL ER 75 MG PO CP24: ORAL_CAPSULE | ORAL | 1 refills | Status: DC

## 2019-05-19 NOTE — Telephone Encounter (Signed)
Copied from [place] [id number]. Topic: Quick Communication - Rx Refill/Question >> May 18, 2019  4:45 PM Nils Flack wrote: Medication: diabetic testing supplies, metFORMIN (GLUCOPHAGE) 500 MG tablet, venlafaxine XR (EFFEXOR-XR) 75 MG 24 hr capsule, simvastatin (ZOCOR) 10 MG tablet,   Has the patient contacted their pharmacy? Yes.   (Agent: If no, request that the patient contact the pharmacy for the refill.) (Agent: If yes, when and what did the pharmacy advise?)  Preferred Pharmacy (with phone number or street name): [facility] These were sent to the wrong pharmacy, they need to be sent to [facility] mail order   Agent: Please be advised that RX refills may take up to 3 business days. We ask that you follow-up with your pharmacy.

## 2019-05-20 LAB — UA/M W/RFLX CULTURE, ROUTINE
Bilirubin, UA: NEGATIVE
Glucose, UA: NEGATIVE
Ketones, UA: NEGATIVE
Nitrite, UA: NEGATIVE
RBC, UA: NEGATIVE
Specific Gravity, UA: 1.02 (ref 1.005–1.030)
Urobilinogen, Ur: 0.2 mg/dL (ref 0.2–1.0)
pH, UA: 5.5 (ref 5.0–7.5)

## 2019-05-20 LAB — URINE CULTURE, REFLEX

## 2019-05-20 LAB — MICROSCOPIC EXAMINATION
Epithelial Cells (non renal): >10 /hpf — AB (ref 0–10)
RBC, Urine: NONE SEEN /hpf (ref 0–2)

## 2019-05-20 LAB — BAYER DCA HB A1C WAIVED: HB A1C (BAYER DCA - WAIVED): 7.1 % — ABNORMAL HIGH (ref <7.0)

## 2019-05-29 ENCOUNTER — Telehealth: Payer: Self-pay | Admitting: Family Medicine

## 2019-05-29 NOTE — Telephone Encounter (Signed)
Daughter notified, if she gets any worse she will make her go to the ER. Appt scheduled for Monday.

## 2019-05-29 NOTE — Telephone Encounter (Signed)
Unfortunately, I don't have anything today- If she does not know who she is, I would not wait over the weekend and I would advise her to go to the ER

## 2019-05-29 NOTE — Telephone Encounter (Signed)
Dawn Moore pt's daughter called in stating that her mom woke up this morning not knowing who she was. She states that she thinks she has another uti and the antibiotic wasn't strong enough. Spoke with Dr Wynetta Emery advised to take pt to the emergency room. Called Dawn Moore back and advised her to take her mom to emergency room. She wanted to schedule appt for Monday incase she can't get mom to go to er.

## 2019-06-01 ENCOUNTER — Ambulatory Visit (INDEPENDENT_AMBULATORY_CARE_PROVIDER_SITE_OTHER): Payer: Medicare HMO | Admitting: Family Medicine

## 2019-06-01 ENCOUNTER — Encounter: Payer: Self-pay | Admitting: Family Medicine

## 2019-06-01 ENCOUNTER — Other Ambulatory Visit: Payer: Self-pay

## 2019-06-01 VITALS — BP 135/82 | HR 87 | Temp 98.5°F

## 2019-06-01 DIAGNOSIS — Z87440 Personal history of urinary (tract) infections: Secondary | ICD-10-CM | POA: Diagnosis not present

## 2019-06-01 DIAGNOSIS — R410 Disorientation, unspecified: Secondary | ICD-10-CM | POA: Diagnosis not present

## 2019-06-01 DIAGNOSIS — M159 Polyosteoarthritis, unspecified: Secondary | ICD-10-CM

## 2019-06-01 MED ORDER — DICLOFENAC SODIUM 1 % TD GEL: 4.0000 g | Freq: Four times a day (QID) | TRANSDERMAL | 12 refills | Status: DC

## 2019-06-01 NOTE — Assessment & Plan Note (Signed)
Awaiting culture today, gets very confused with UTIs. Will get her into see urology for recurrent UTIs.

## 2019-06-01 NOTE — Progress Notes (Signed)
BP 135/82   Pulse 87   Temp 98.5 F (36.9 C) (Oral)   SpO2 96%    Subjective:    Patient ID: [name], female    DOB: 06-09-1941, 78 y.o.   MRN: [id number]  HPI: NEVELYN MELLOTT is a 78 y.o. female  Chief Complaint  Patient presents with  . Urinary Tract Infection  . Joint Pain    fingers and toes, requesting RX for diclofenac gel if possible   URINARY SYMPTOMS- hasn't been feeling right. Has been talking out of her mind a little bit, didn't recognize her daughter.  Duration: Few days Dysuria: burning Urinary frequency: yes Urgency: yes Small volume voids: no Symptom severity: moderate Urinary incontinence: yes Foul odor: no Hematuria: no Abdominal pain: no Back pain: no Suprapubic pain/pressure: no Flank pain: no Fever:  no Vomiting: no Relief with cranberry juice: no Relief with pyridium: no Status: stable Previous urinary tract infection: yes Recurrent urinary tract infection: yes Sexual activity: No sexually active History of sexually transmitted disease: no Vaginal discharge: no Treatments attempted: antibiotics    Relevant past medical, surgical, family and social history reviewed and updated as indicated. Interim medical history since our last visit reviewed. Allergies and medications reviewed and updated.  Review of Systems  Constitutional: Negative.   Respiratory: Negative.   Cardiovascular: Negative.   Gastrointestinal: Negative.   Genitourinary: Positive for dysuria, frequency and urgency. Negative for decreased urine volume, difficulty urinating, dyspareunia, enuresis, flank pain, genital sores, hematuria, menstrual problem, pelvic pain, vaginal bleeding, vaginal discharge and vaginal pain.  Musculoskeletal: Positive for arthralgias. Negative for back pain, gait problem, joint swelling, myalgias, neck pain and neck stiffness.  Skin: Negative.   Neurological: Negative.   Psychiatric/Behavioral: Negative.     Per HPI unless specifically  indicated above     Objective:    BP 135/82   Pulse 87   Temp 98.5 F (36.9 C) (Oral)   SpO2 96%   Wt Readings from Last 3 Encounters:  05/18/19 218 lb 8 oz (99.1 kg)  02/13/19 210 lb (95.3 kg)  01/09/19 214 lb (97.1 kg)    Physical Exam Vitals signs and nursing note reviewed.  Constitutional:      General: She is not in acute distress.    Appearance: Normal appearance. She is not ill-appearing, toxic-appearing or diaphoretic.  HENT:     Head: Normocephalic and atraumatic.     Right Ear: External ear normal.     Left Ear: External ear normal.     Nose: Nose normal.     Mouth/Throat:     Mouth: Mucous membranes are moist.     Pharynx: Oropharynx is clear.  Eyes:     General: No scleral icterus.       Right eye: No discharge.        Left eye: No discharge.     Extraocular Movements: Extraocular movements intact.     Conjunctiva/sclera: Conjunctivae normal.     Pupils: Pupils are equal, round, and reactive to light.  Neck:     Musculoskeletal: Normal range of motion and neck supple.  Cardiovascular:     Rate and Rhythm: Normal rate and regular rhythm.     Pulses: Normal pulses.     Heart sounds: Normal heart sounds. No murmur. No friction rub. No gallop.   Pulmonary:     Effort: Pulmonary effort is normal. No respiratory distress.     Breath sounds: Normal breath sounds. No stridor. No wheezing, rhonchi or rales.  Chest:     Chest wall: No tenderness.  Musculoskeletal: Normal range of motion.  Skin:    General: Skin is warm and dry.     Capillary Refill: Capillary refill takes less than 2 seconds.     Coloration: Skin is not jaundiced or pale.     Findings: No bruising, erythema, lesion or rash.  Neurological:     General: No focal deficit present.     Mental Status: She is alert and oriented to person, place, and time. Mental status is at baseline.  Psychiatric:        Mood and Affect: Mood normal.        Behavior: Behavior normal.        Thought Content:  Thought content normal.        Judgment: Judgment normal.     Results for orders placed or performed in visit on 05/18/19  Microscopic Examination  Result Value Ref Range   WBC, UA 11-30 (A) 0 - 5 /hpf   RBC None seen 0 - 2 /hpf   Epithelial Cells (non renal) >10 (A) 0 - 10 /hpf   Bacteria, UA Many (A) None seen/Few  Urine Culture, Reflex  Result Value Ref Range   Urine Culture, Routine Final report    Organism ID, Bacteria Comment   Bayer DCA Hb A1c Waived  Result Value Ref Range   HB A1C (BAYER DCA - WAIVED) 7.1 (H) <7.0 %  Comprehensive metabolic panel  Result Value Ref Range   Glucose 121 (H) 65 - 99 mg/dL   BUN 10 8 - 27 mg/dL   Creatinine, Ser 0.89 0.57 - 1.00 mg/dL   GFR calc non Af Amer 63 >59 mL/min/1.73   GFR calc Af Amer 73 >59 mL/min/1.73   BUN/Creatinine Ratio 11 (L) 12 - 28   Sodium 139 134 - 144 mmol/L   Potassium 4.6 3.5 - 5.2 mmol/L   Chloride 104 96 - 106 mmol/L   CO2 22 20 - 29 mmol/L   Calcium 9.5 8.7 - 10.3 mg/dL   Total Protein 6.8 6.0 - 8.5 g/dL   Albumin 4.2 3.7 - 4.7 g/dL   Globulin, Total 2.6 1.5 - 4.5 g/dL   Albumin/Globulin Ratio 1.6 1.2 - 2.2   Bilirubin Total 0.6 0.0 - 1.2 mg/dL   Alkaline Phosphatase 92 39 - 117 IU/L   AST 33 0 - 40 IU/L   ALT 19 0 - 32 IU/L  Lipid Panel w/o Chol/HDL Ratio  Result Value Ref Range   Cholesterol, Total 143 100 - 199 mg/dL   Triglycerides 111 0 - 149 mg/dL   HDL 55 >39 mg/dL   VLDL Cholesterol Cal 22 5 - 40 mg/dL   LDL Calculated 66 0 - 99 mg/dL  UA/M w/rflx Culture, Routine  Result Value Ref Range   Specific Gravity, UA 1.020 1.005 - 1.030   pH, UA 5.5 5.0 - 7.5   Color, UA Yellow Yellow   Appearance Ur Hazy (A) Clear   Leukocytes,UA 3+ (A) Negative   Protein,UA Trace (A) Negative/Trace   Glucose, UA Negative Negative   Ketones, UA Negative Negative   RBC, UA Negative Negative   Bilirubin, UA Negative Negative   Urobilinogen, Ur 0.2 0.2 - 1.0 mg/dL   Nitrite, UA Negative Negative   Microscopic  Examination See below:    Urinalysis Reflex Comment   CBC with Differential/Platelet  Result Value Ref Range   WBC 4.7 3.4 - 10.8 x10E3/uL   RBC 4.82 3.77 -  5.28 x10E6/uL   Hemoglobin 13.8 11.1 - 15.9 g/dL   Hematocrit 42.1 34.0 - 46.6 %   MCV 87 79 - 97 fL   MCH 28.6 26.6 - 33.0 pg   MCHC 32.8 31.5 - 35.7 g/dL   RDW 15.3 11.7 - 15.4 %   Platelets 146 (L) 150 - 450 x10E3/uL   Neutrophils 52 Not Estab. %   Lymphs 37 Not Estab. %   Monocytes 8 Not Estab. %   Eos 2 Not Estab. %   Basos 1 Not Estab. %   Neutrophils Absolute 2.5 1.4 - 7.0 x10E3/uL   Lymphocytes Absolute 1.7 0.7 - 3.1 x10E3/uL   Monocytes Absolute 0.4 0.1 - 0.9 x10E3/uL   EOS (ABSOLUTE) 0.1 0.0 - 0.4 x10E3/uL   Basophils Absolute 0.0 0.0 - 0.2 x10E3/uL   Immature Granulocytes 0 Not Estab. %   Immature Grans (Abs) 0.0 0.0 - 0.1 x10E3/uL      Assessment & Plan:   Problem List Items Addressed This Visit      Musculoskeletal and Integument   Generalized osteoarthritis    Refill of her voltaren gel given today. Call with any concerns.         Other   History of recurrent UTIs    Awaiting culture today, gets very confused with UTIs. Will get her into see urology for recurrent UTIs.       Relevant Orders   Ambulatory referral to Urology    Other Visit Diagnoses    Confusion    -  Primary   Concern for another UTI. 1+ leuks, trace bacteria. Will await culture. Call with any concerns. Continue to monitor.    Relevant Orders   UA/M w/rflx Culture, Routine       Follow up plan: Return if symptoms worsen or fail to improve.

## 2019-06-01 NOTE — Assessment & Plan Note (Signed)
Refill of her voltaren gel given today. Call with any concerns.

## 2019-06-03 ENCOUNTER — Telehealth: Payer: Self-pay

## 2019-06-03 LAB — MICROSCOPIC EXAMINATION: RBC, Urine: NONE SEEN /hpf (ref 0–2)

## 2019-06-03 LAB — URINE CULTURE, REFLEX

## 2019-06-03 LAB — UA/M W/RFLX CULTURE, ROUTINE
Bilirubin, UA: NEGATIVE
Glucose, UA: NEGATIVE
Ketones, UA: NEGATIVE
Nitrite, UA: NEGATIVE
Protein,UA: NEGATIVE
RBC, UA: NEGATIVE
Specific Gravity, UA: 1.01 (ref 1.005–1.030)
Urobilinogen, Ur: 0.2 mg/dL (ref 0.2–1.0)
pH, UA: 6 (ref 5.0–7.5)

## 2019-06-03 NOTE — Telephone Encounter (Signed)
We were waiting on a culture before we sent in an antibiotic as her urine didn't look bad.

## 2019-06-03 NOTE — Telephone Encounter (Signed)
Message relayed to patient. Verbalized understanding and denied questions.   

## 2019-06-03 NOTE — Telephone Encounter (Signed)
Copied from [place] [phone]. Topic: General - Call Back - No Documentation >> Jun 03, 2019 10:21 AM Erick Blinks wrote: Reason for CRM: Pt requesting call back. Pt is needing clarification, she states that an antibiotic was discussed to treat UTI. However, the AVS only reflects gel. Please advise   Routing to provider. I do not see a recent antibiotic called in for the patient.

## 2019-06-05 ENCOUNTER — Telehealth: Payer: Self-pay | Admitting: Family Medicine

## 2019-06-05 DIAGNOSIS — F332 Major depressive disorder, recurrent severe without psychotic features: Secondary | ICD-10-CM

## 2019-06-05 DIAGNOSIS — F411 Generalized anxiety disorder: Secondary | ICD-10-CM

## 2019-06-05 DIAGNOSIS — F4321 Adjustment disorder with depressed mood: Secondary | ICD-10-CM

## 2019-06-05 DIAGNOSIS — F4329 Adjustment disorder with other symptoms: Secondary | ICD-10-CM

## 2019-06-05 NOTE — Telephone Encounter (Signed)
Called and spoke to patient's daughter Lattie Haw. She states that she spoke with her mom and her mom states that she is just depressed. Lattie Haw states that she has been out more trying to make more money so her mom has been at home alone more recently. Lattie Haw said she wants to see how things go over the weekend and will let us know on Monday. She states that if her mom gets bad over the weekend, she will take her to the urgent care.

## 2019-06-05 NOTE — Telephone Encounter (Signed)
Can she come drop off another urine in case the last one missed it? She didn't have a UTI- and if she's that confused, I don't want to be missing something else thinking its a UTI

## 2019-06-05 NOTE — Telephone Encounter (Signed)
Please let her know that her urine did not grow out any infection. Can we check to see how she's feeling?

## 2019-06-05 NOTE — Telephone Encounter (Signed)
I've put in a referral for CCM social worker to see if we can get her some grief counseling and see if it helps

## 2019-06-05 NOTE — Addendum Note (Signed)
Addended by: Valerie Roys on: 06/05/2019 05:02 PM   Modules accepted: Orders

## 2019-06-05 NOTE — Telephone Encounter (Signed)
Pt daughter called back stating pt has no energy, she doesn't want to get up and moving, she is short and snappy, she has been confused. Pt asked about her sister-in-law was coming (this person passed 2016). Lattie Haw said this is pts behaviors when she has UTI. Please call Lattie Haw to advise.

## 2019-06-05 NOTE — Telephone Encounter (Signed)
Called and left VM regarding results (verified DPR). Asked for patient to call us back if she is not feeling better.

## 2019-06-09 ENCOUNTER — Ambulatory Visit: Payer: Medicare HMO | Admitting: Family Medicine

## 2019-06-19 ENCOUNTER — Ambulatory Visit (INDEPENDENT_AMBULATORY_CARE_PROVIDER_SITE_OTHER): Payer: Medicare HMO | Admitting: Licensed Clinical Social Worker

## 2019-06-19 ENCOUNTER — Other Ambulatory Visit: Payer: Self-pay

## 2019-06-19 DIAGNOSIS — F411 Generalized anxiety disorder: Secondary | ICD-10-CM

## 2019-06-19 DIAGNOSIS — E1122 Type 2 diabetes mellitus with diabetic chronic kidney disease: Secondary | ICD-10-CM

## 2019-06-19 DIAGNOSIS — F332 Major depressive disorder, recurrent severe without psychotic features: Secondary | ICD-10-CM | POA: Diagnosis not present

## 2019-06-19 DIAGNOSIS — N182 Chronic kidney disease, stage 2 (mild): Secondary | ICD-10-CM | POA: Diagnosis not present

## 2019-06-19 NOTE — Chronic Care Management (AMB) (Signed)
Chronic Care Management    Clinical Social Work General Note  06/19/2019 Name: Dawn Moore MRN: [id number] DOB: 07-04-1941  Dawn Moore is a 78 y.o. year old female who is a primary care patient of Valerie Roys, DO. The CCM was consulted to assist the patient with Mental Health Counseling and Resources and Grief Counseling.   [name] was given information about Chronic Care Management services today including:  1. CCM service includes personalized support from designated clinical staff supervised by her physician, including individualized plan of care and coordination with other care providers 2. 24/7 contact phone numbers for assistance for urgent and routine care needs. 3. Service will only be billed when office clinical staff spend 20 minutes or more in a month to coordinate care. 4. Only one practitioner may furnish and bill the service in a calendar month. 5. The patient may stop CCM services at any time (effective at the end of the month) by phone call to the office staff. 6. The patient will be responsible for cost sharing (co-pay) of up to 20% of the service fee (after annual deductible is met).  Patient agreed to services and verbal consent obtained.   Review of patient status, including review of consultants reports, relevant laboratory and other test results, and collaboration with appropriate care team members and the patient's provider was performed as part of comprehensive patient evaluation and provision of chronic care management services.    SDOH (Social Determinants of Health) screening performed today. See Care Plan Entry related to challenges with: Depression and Stress  Goals Addressed    . "I still struggle with my grief daily" (pt-stated)       Current Barriers:  . Financial constraints . Limited social support . ADL IADL limitations . Mental Health Concerns  . Social Isolation . Limited education about coping skills that she can implement into her daily  routine to combat grief, stress, depression* . Lacks knowledge of community resource: available mental health and grief support resources within the area that she is eligible for  Clinical Social Work Clinical Goal(s):  Marland Kitchen Over the next 90 days, client will work with SW to address concerns related to grief . Over the next 90 days, patient will work with LCSW to address needs related to implementing appropriate support/resources to help manage grief/depression  Interventions: . Patient interviewed and appropriate assessments performed . Provided mental health counseling with regard to patient's recent losses and ongoing grief. . Provided mental health counseling with regard to patient was emotional throughout phone call due to her daily difficulty with coping with the loss of her family and isolation.  Marland Kitchen LCSW provided reflective listening and implemented appropriate interventions to help suppport patient and her emotional needs  . Provided patient with information about healthy self-care methods that she can implement into her daily routine to combat isolation/depression/stress. Patient reports that she has had a difficult time adjusting to her daughter making new friends and not being at home with her as much.  . Discussed plans with patient for ongoing care management follow up and provided patient with direct contact information for care management team . Advised patient to consider grief therapy referral over the next month. Patient refused referral at this time. Patient is hesitant to pursue mental health support resources as patient has a very negative experience with a psychiatrist that she saw in the past.  . CSW used motivational interviewing techniques to engage pt in meaningful conversation and to assess pt's wilingess  to gain community support in order to address her grief. CSW explored pt's coping skills and access to community services. . Assisted patient/caregiver with obtaining  information about health plan benefits . Provided education to patient/caregiver about Hospice and/or Palliative Care services- in regards to their grief support resources- individual and group counseling services  Patient Self Care Activities:  . Attends all scheduled provider appointments . Calls provider office for new concerns or questions  Initial goal documentation    Depression screen [facility] 2/9 06/19/2019 05/18/2019 02/13/2019 07/22/2018 04/25/2018  Decreased Interest '2 3 2 2 2  ' Down, Depressed, Hopeless '2 3 2 2 3  ' PHQ - 2 Score '4 6 4 4 5  ' Altered sleeping '1 3 2 3 3  ' Tired, decreased energy '2 3 2 3 2  ' Change in appetite 0 0 '1 2 1  ' Feeling bad or failure about yourself  '2 1 1 2 2  ' Trouble concentrating '1 2 3 2 2  ' Moving slowly or fidgety/restless 0 0 0 0 0  Suicidal thoughts 0 0 0 0 0  PHQ-9 Score '10 15 13 16 15  ' Difficult doing work/chores Very difficult Very difficult - Very difficult Somewhat difficult  Some recent data might be hidden   GAD 7 : Generalized Anxiety Score 06/19/2019 05/18/2019 02/13/2019 01/09/2019  Nervous, Anxious, on Edge '1 3 2 2  ' Control/stop worrying '1 3 3 3  ' Worry too much - different things '1 3 3 3  ' Trouble relaxing '1 1 1 2  ' Restless 0 0 1 1  Easily annoyed or irritable '2 3 3 2  ' Afraid - awful might happen 1 0 0 2  Total GAD 7 Score '7 13 13 15  ' Anxiety Difficulty Somewhat difficult Somewhat difficult Somewhat difficult -    Follow Up Plan: SW will follow up with patient by phone over the next month      Eula Fried, [place], MSW, [email or web address] Phone: [phone]

## 2019-06-24 ENCOUNTER — Ambulatory Visit: Payer: Medicare HMO | Admitting: Urology

## 2019-07-17 ENCOUNTER — Telehealth: Payer: Self-pay

## 2019-08-10 ENCOUNTER — Telehealth: Payer: Self-pay

## 2019-08-10 ENCOUNTER — Ambulatory Visit: Payer: Self-pay | Admitting: Licensed Clinical Social Worker

## 2019-08-10 NOTE — Chronic Care Management (AMB) (Signed)
  Chronic Care Management    Clinical Social Work Follow Up Note  08/10/2019 Name: Dawn Moore MRN: [id number] DOB: 14-Jun-1941  Dawn Moore is a 78 y.o. year old female who is a primary care patient of Valerie Roys, DO. The CCM team was consulted for assistance with Mental Health Counseling and Resources and Grief Counseling.   Review of patient status, including review of consultants reports, other relevant assessments, and collaboration with appropriate care team members and the patient's provider was performed as part of comprehensive patient evaluation and provision of chronic care management services.    LCSW completed CCM outreach attempt today but was unable to reach patient successfully. A HIPPA compliant voice message was left encouraging patient to return call once available. LCSW rescheduled CCM SW appointment as well.   Follow Up Plan: SW will follow up with patient by phone next month.  Eula Fried, BSW, MSW, [facility] Care Management [email or web address] Phone: [phone]

## 2019-08-20 ENCOUNTER — Ambulatory Visit: Payer: Medicare Other

## 2019-08-20 NOTE — Progress Notes (Deleted)
Subjective:   Dawn Moore is a 78 y.o. female who presents for Medicare Annual (Subsequent) preventive examination.  This visit is being conducted via phone call  - after an attmept to do on video chat - due to the COVID-19 pandemic. This patient has given me verbal consent via phone to conduct this visit, patient states they are participating from their home address. Some vital signs may be absent or patient reported.   Patient identification: identified by name, DOB, and current address.    Review of Systems:         Objective:     Vitals: There were no vitals taken for this visit.  There is no height or weight on file to calculate BMI.  Advanced Directives 04/25/2018 10/14/2017 07/28/2017 07/11/2017 04/18/2017 03/28/2016 03/07/2016  Does Patient Have a Medical Advance Directive? No No No No No No No  Would patient like information on creating a medical advance directive? Yes (MAU/Ambulatory/Procedural Areas - Information given) No - Patient declined - - Yes (MAU/Ambulatory/Procedural Areas - Information given) No - patient declined information No - patient declined information    Tobacco Social History   Tobacco Use  Smoking Status Never Smoker  Smokeless Tobacco Never Used     Counseling given: Not Answered   Clinical Intake:                 Nutrition Risk Assessment:  Has the patient had any N/V/D within the last 2 months?  {YES/NO:21197} Does the patient have any non-healing wounds?  {YES/NO:21197} Has the patient had any unintentional weight loss or weight gain?  {YES/NO:21197}  Diabetes:  Is the patient diabetic?  {YES/NO:21197} If diabetic, was a CBG obtained today?  {YES/NO:21197} Did the patient bring in their glucometer from home?  {YES/NO:21197} How often do you monitor your CBG's? ***.   Financial Strains and Diabetes Management:  Are you having any financial strains with the device, your supplies or your medication? {YES/NO:21197}.  Does the  patient want to be seen by Chronic Care Management for management of their diabetes?  {YES/NO:21197} Would the patient like to be referred to a Nutritionist or for Diabetic Management?  {YES/NO:21197}  Diabetic Exams:  Diabetic Eye Exam:  Overdue for diabetic eye exam. Pt has been advised about the importance in completing this exam.   Diabetic Foot Exam: Completed 02/13/2019.         Past Medical History:  Diagnosis Date  . Anxiety   . Arthritis    OA knees, hands and feet  . Depression   . Diabetes mellitus without complication ([place])   . GERD (gastroesophageal reflux disease)   . Headache   . Osteopenia   . Shortness of breath dyspnea   . Sleep apnea    doesn't use CPAP  . 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 ABDOMINAL HYSTERECTOMY  1986   still has one ovary  . CATARACT EXTRACTION W/PHACO Left 03/07/2016   Procedure: CATARACT EXTRACTION PHACO AND INTRAOCULAR LENS PLACEMENT (IOC);  Surgeon: Leandrew Koyanagi, MD;  Location: [facility];  Service: Ophthalmology;  Laterality: Left;  DIABETIC -oral meds Sleep apnea  . CATARACT EXTRACTION W/PHACO Right 03/28/2016   Procedure: CATARACT EXTRACTION PHACO AND INTRAOCULAR LENS PLACEMENT (IOC);  Surgeon: Leandrew Koyanagi, MD;  Location: [facility];  Service: Ophthalmology;  Laterality: Right;  DIABETIC - oral meds  . CHOLECYSTECTOMY  1999  . COCHLEAR IMPLANT    .  FOOT FRACTURE SURGERY    . HEEL SPUR SURGERY Left 11/04/09   Revision 01/2011  . INTRAOCULAR LENS INSERTION Bilateral   . KYPHOPLASTY N/A 10/17/2017   Procedure: LUMBAR 3 KYPHOPLASTY; REQUEST 1 HOUR;  Surgeon: Phylliss Bob, MD;  Location: [facility];  Service: Orthopedics;  Laterality: N/A;  LUMBAR 3 KYPHOPLASTY; REQUEST 1 HOUR   Family History  Problem Relation Age of Onset  . Stroke Mother   . Dementia Mother   . Cancer Father        prostate  . Skin cancer Daughter   .  Hyperlipidemia Son   . Hypertension Son   . Mental illness Son   . Alcohol abuse Son   . Arthritis Sister        rheumatoid  . Cancer Sister        breast  . Thyroid disease Sister   . Cancer Sister        thyroid  . Cancer Brother        prostate  . Arthritis Maternal Grandmother    Social History   Socioeconomic History  . Marital status: Widowed    Spouse name: Not on file  . Number of children: Not on file  . Years of education: Not on file  . Highest education level: Not on file  Occupational History  . Not on file  Social Needs  . Financial resource strain: Not hard at all  . Food insecurity    Worry: Never true    Inability: Never true  . Transportation needs    Medical: No    Non-medical: No  Tobacco Use  . Smoking status: Never Smoker  . Smokeless tobacco: Never Used  Substance and Sexual Activity  . Alcohol use: No    Alcohol/week: 0.0 standard drinks  . Drug use: No  . Sexual activity: Never    Birth control/protection: Surgical  Lifestyle  . Physical activity    Days per week: 0 days    Minutes per session: 0 min  . Stress: To some extent  Relationships  . Social connections    Talks on phone: More than three times a week    Gets together: More than three times a week    Attends religious service: Never    Active member of club or organization: No    Attends meetings of clubs or organizations: Never    Relationship status: Widowed  Other Topics Concern  . Not on file  Social History Narrative  . Not on file    Outpatient Encounter Medications as of 08/20/2019  Medication Sig  . ACCU-CHEK AVIVA PLUS test strip   . ACCU-CHEK SOFTCLIX LANCETS lancets   . ALPRAZolam (XANAX) 1 MG tablet Take 1 mg by mouth at bedtime as needed for anxiety. Takes during the day as needed.  . Cholecalciferol (VITAMIN D-3) 5000 units TABS Take 5,000 Units by mouth daily.  . clotrimazole (LOTRIMIN) 1 % cream Apply 1 application topically 2 (two) times daily.  .  Cyanocobalamin (VITAMIN B 12 PO) Take by mouth daily.  . diclofenac sodium (VOLTAREN) 1 % GEL Apply 4 g topically 4 (four) times daily.  . fluticasone (FLONASE) 50 MCG/ACT nasal spray Place 2 sprays into both nostrils daily.  . metFORMIN (GLUCOPHAGE) 500 MG tablet Take 2 tablets (1,000 mg total) by mouth daily with breakfast.  . nitrofurantoin, macrocrystal-monohydrate, (MACROBID) 100 MG capsule Take 1 capsule (100 mg total) by mouth 2 (two) times daily.  . simvastatin (ZOCOR) 10 MG tablet Take  1 tablet (10 mg total) by mouth every evening.  . venlafaxine XR (EFFEXOR-XR) 150 MG 24 hr capsule Take 1 capsule (150 mg total) by mouth daily with breakfast.  . venlafaxine XR (EFFEXOR-XR) 75 MG 24 hr capsule TAKE 1 CAPSULE DAILY WITH BREAKFAST (TOTAL DAILY DOSE IS 225MG )  . vitamin C (ASCORBIC ACID) 500 MG tablet Take 500 mg by mouth daily.   No facility-administered encounter medications on file as of 08/20/2019.     Activities of Daily Living No flowsheet data found.  Patient Care Team: Valerie Roys, DO as PCP - General (Family Medicine) Greg Cutter, LCSW as Social Worker (Licensed Clinical Social Worker)    Assessment:   This is a routine wellness examination for Alicianna.  Exercise Activities and Dietary recommendations    Goals    . "I still struggle with my grief daily" (pt-stated)     Current Barriers:  . Financial constraints . Limited social support . ADL IADL limitations . Mental Health Concerns  . Social Isolation . Limited education about coping skills that she can implement into her daily routine to combat grief, stress, depression* . Lacks knowledge of community resource: available mental health and grief support resources within the area that she is eligible for  Clinical Social Work Clinical Goal(s):  Marland Kitchen Over the next 90 days, client will work with SW to address concerns related to grief . Over the next 90 days, patient will work with LCSW to address needs related to  implementing appropriate support/resources to help manage grief/depression  Interventions: . Patient interviewed and appropriate assessments performed . Provided mental health counseling with regard to patient's recent losses and ongoing grief. . Provided mental health counseling with regard to patient was emotional throughout phone call due to her daily difficulty with coping with the loss of her family and isolation.  Marland Kitchen LCSW provided reflective listening and implemented appropriate interventions to help suppport patient and her emotional needs  . Provided patient with information about healthy self-care methods that she can implement into her daily routine to combat isolation/depression/stress. Patient reports that she has had a difficult time adjusting to her daughter making new friends and not being at home with her as much.  . Discussed plans with patient for ongoing care management follow up and provided patient with direct contact information for care management team . Advised patient to consider grief therapy referral over the next month. Patient refused referral at this time. Patient is hesitant to pursue mental health support resources as patient has a very negative experience with a psychiatrist that she saw in the past.  . CSW used motivational interviewing techniques to engage pt in meaningful conversation and to assess pt's wilingess to gain community support in order to address her grief. CSW explored pt's coping skills and access to community services. . Assisted patient/caregiver with obtaining information about health plan benefits . Provided education to patient/caregiver about Hospice and/or Palliative Care services- in regards to their grief support resources- individual and group counseling services  Patient Self Care Activities:  . Attends all scheduled provider appointments . Calls provider office for new concerns or questions  Initial goal documentation     . DIET -  INCREASE WATER INTAKE     Recommend drinking at least 6-8 glasses of water a day        Fall Risk: Fall Risk  07/22/2018 04/25/2018 07/23/2017 04/18/2017 08/06/2016  Falls in the past year? Yes Yes Yes Yes Yes  Number falls in past  yr: 2 or more 2 or more 2 or more 2 or more 2 or more  Injury with Fall? Yes Yes Yes Yes Yes  Risk Factor Category  - High Fall Risk High Fall Risk - -  Follow up - Falls prevention discussed;Falls evaluation completed - - Falls evaluation completed    FALL RISK PREVENTION PERTAINING TO THE HOME:  Any stairs in or around the home? {YES/NO:21197} If so, are there any without handrails? {YES/NO:21197}  Home free of loose throw rugs in walkways, pet beds, electrical cords, etc? {YES/NO:21197} Adequate lighting in your home to reduce risk of falls? {YES/NO:21197}  ASSISTIVE DEVICES UTILIZED TO PREVENT FALLS:  Life alert? {YES/NO:21197} Use of a cane, walker or w/c? {YES/NO:21197} Grab bars in the bathroom? {YES/NO:21197} Shower chair or bench in shower? {YES/NO:21197} Elevated toilet seat or a handicapped toilet? {YES/NO:21197}  TIMED UP AND GO:  Unable to perform  Depression Screen PHQ 2/9 Scores 06/19/2019 05/18/2019 02/13/2019 07/22/2018  PHQ - 2 Score 4 6 4 4   PHQ- 9 Score 10 15 13 16      Cognitive Function MMSE - Mini Mental State Exam 08/06/2016  Orientation to time 4  Orientation to Place 4  Registration 3  Attention/ Calculation 5  Recall 2  Language- name 2 objects 2  Language- repeat 1  Language- follow 3 step command 2  Language- read & follow direction 1  Write a sentence 1  Copy design 1  Total score 26     6CIT Screen 04/25/2018 04/18/2017  What Year? 0 points 0 points  What month? 0 points 0 points  What time? 0 points 0 points  Count back from 20 0 points 0 points  Months in reverse 0 points 0 points  Repeat phrase 0 points 0 points  Total Score 0 0    Immunization History  Administered Date(s) Administered  . Influenza,  High Dose Seasonal PF 12/14/2016, 10/24/2017, 11/13/2018  . Influenza,inj,Quad PF,6+ Mos 09/23/2015  . Influenza-Unspecified 10/08/2013  . Pneumococcal Conjugate-13 09/23/2015  . Pneumococcal Polysaccharide-23 09/24/2008, 04/18/2017  . Td 12/31/2005  . Tdap 07/24/2016  . Zoster 09/24/2008    Qualifies for Shingles Vaccine? yes Zostavax completed. Due for Shingrix. Education has been provided regarding the importance of this vaccine. Pt has been advised to call insurance company to determine out of pocket expense. Advised may also receive vaccine at local pharmacy or Health Dept. Verbalized acceptance and understanding.  Tdap: up to date  Flu Vaccine: Due 09/2019  Pneumococcal Vaccine: up to date   Screening Tests Health Maintenance  Topic Date Due  . OPHTHALMOLOGY EXAM  12/04/2017  . INFLUENZA VACCINE  08/01/2019  . URINE MICROALBUMIN  11/14/2019  . HEMOGLOBIN A1C  11/18/2019  . FOOT EXAM  02/14/2020  . TETANUS/TDAP  07/24/2026  . DEXA SCAN  Completed  . PNA vac Low Risk Adult  Completed    Cancer Screenings:  Colorectal Screening: no longer required  Mammogram:no longer required   Bone Density: Completed 03/15/2016  Lung Cancer Screening: (Low Dose CT Chest recommended if Age 50-80 years, 30 pack-year currently smoking OR have quit w/in 15years.) does not qualify.   Additional Screening:  Hepatitis C Screening: does not qualify  Dental Screening: Recommended annual dental exams for proper oral hygiene   Community Resource Referral:  CRR required this visit?  {YES/NO:21197}      Plan:  I have personally reviewed and addressed the Medicare Annual Wellness questionn/aire and have noted the /following in the patient's chart:  A.  Medical and social history B. Use of alcohol, tobacco or illicit drugs  C. Current medications and supplements D. Functional ability and status E.  Nutritional status F.  Physical activity G. Advance directives H. List of other  physicians I.  Hospitalizations, surgeries, and ER visits in previous 12 months J.  [place] such as hearing and vision if needed, cognitive and depression L. Referrals and appointments   In addition, I have reviewed and discussed with patient certain preventive protocols, quality metrics, and best practice recommendations. A written personalized care plan for preventive services as well as general preventive health recommendations were provided to patient.   Signed,    Bevelyn Ngo, LPN  3/82/5053 Nurse Health Advisor   Nurse Notes:    Ms. Gift , Thank you for taking time to come for your Medicare Wellness Visit. I appreciate your ongoing commitment to your health goals. Please review the following plan we discussed and let me know if I can assist you in the future.   Screening recommendations/referrals: Colonoscopy: no longer required  Mammogram: no longer required  Bone Density:  Recommended yearly ophthalmology/optometry visit for glaucoma screening and checkup Recommended yearly dental visit for hygiene and checkup  Vaccinations: Influenza vaccine: *** Pneumococcal vaccine: *** Tdap vaccine: *** Shingles vaccine: ***    Advanced directives: ***  Conditions/risks identified: ***  Next appointment: ***   Preventive Care 65 Years and Older, Female Preventive care refers to lifestyle choices and visits with your health care provider that can promote health and wellness. What does preventive care include?  A yearly physical exam. This is also called an annual well check.  Dental exams once or twice a year.  Routine eye exams. Ask your health care provider how often you should have your eyes checked.  Personal lifestyle choices, including:  Daily care of your teeth and gums.  Regular physical activity.  Eating a healthy diet.  Avoiding tobacco and drug use.  Limiting alcohol use.  Practicing safe sex.  Taking low-dose aspirin every  day.  Taking vitamin and mineral supplements as recommended by your health care provider. What happens during an annual well check? The services and screenings done by your health care provider during your annual well check will depend on your age, overall health, lifestyle risk factors, and family history of disease. Counseling  Your health care provider may ask you questions about your:  Alcohol use.  Tobacco use.  Drug use.  Emotional well-being.  Home and relationship well-being.  Sexual activity.  Eating habits.  History of falls.  Memory and ability to understand (cognition).  Work and work Statistician.  Reproductive health. Screening  You may have the following tests or measurements:  Height, weight, and BMI.  Blood pressure.  Lipid and cholesterol levels. These may be checked every 5 years, or more frequently if you are over 53 years old.  Skin check.  Lung cancer screening. You may have this screening every year starting at age 25 if you have a 30-pack-year history of smoking and currently smoke or have quit within the past 15 years.  Fecal occult blood test (FOBT) of the stool. You may have this test every year starting at age 68.  Flexible sigmoidoscopy or colonoscopy. You may have a sigmoidoscopy every 5 years or a colonoscopy every 10 years starting at age 23.  Hepatitis C blood test.  Hepatitis B blood test.  Sexually transmitted disease (STD) testing.  Diabetes screening. This is done by checking your blood  sugar (glucose) after you have not eaten for a while (fasting). You may have this done every 1-3 years.  Bone density scan. This is done to screen for osteoporosis. You may have this done starting at age 28.  Mammogram. This may be done every 1-2 years. Talk to your health care provider about how often you should have regular mammograms. Talk with your health care provider about your test results, treatment options, and if necessary, the need  for more tests. Vaccines  Your health care provider may recommend certain vaccines, such as:  Influenza vaccine. This is recommended every year.  Tetanus, diphtheria, and acellular pertussis (Tdap, Td) vaccine. You may need a Td booster every 10 years.  Zoster vaccine. You may need this after age 56.  Pneumococcal 13-valent conjugate (PCV13) vaccine. One dose is recommended after age 27.  Pneumococcal polysaccharide (PPSV23) vaccine. One dose is recommended after age 52. Talk to your health care provider about which screenings and vaccines you need and how often you need them. This information is not intended to replace advice given to you by your health care provider. Make sure you discuss any questions you have with your health care provider. Document Released: 01/13/2016 Document Revised: 09/05/2016 Document Reviewed: 10/18/2015 Elsevier Interactive Patient Education  2017 [place] Prevention in the Home Falls can cause injuries. They can happen to people of all ages. There are many things you can do to make your home safe and to help prevent falls. What can I do on the outside of my home?  Regularly fix the edges of walkways and driveways and fix any cracks.  Remove anything that might make you trip as you walk through a door, such as a raised step or threshold.  Trim any bushes or trees on the path to your home.  Use bright outdoor lighting.  Clear any walking paths of anything that might make someone trip, such as rocks or tools.  Regularly check to see if handrails are loose or broken. Make sure that both sides of any steps have handrails.  Any raised decks and porches should have guardrails on the edges.  Have any leaves, snow, or ice cleared regularly.  Use sand or salt on walking paths during winter.  Clean up any spills in your garage right away. This includes oil or grease spills. What can I do in the bathroom?  Use night lights.  Install grab bars  by the toilet and in the tub and shower. Do not use towel bars as grab bars.  Use non-skid mats or decals in the tub or shower.  If you need to sit down in the shower, use a plastic, non-slip stool.  Keep the floor dry. Clean up any water that spills on the floor as soon as it happens.  Remove soap buildup in the tub or shower regularly.  Attach bath mats securely with double-sided non-slip rug tape.  Do not have throw rugs and other things on the floor that can make you trip. What can I do in the bedroom?  Use night lights.  Make sure that you have a light by your bed that is easy to reach.  Do not use any sheets or blankets that are too big for your bed. They should not hang down onto the floor.  Have a firm chair that has side arms. You can use this for support while you get dressed.  Do not have throw rugs and other things on the floor that can  make you trip. What can I do in the kitchen?  Clean up any spills right away.  Avoid walking on wet floors.  Keep items that you use a lot in easy-to-reach places.  If you need to reach something above you, use a strong step stool that has a grab bar.  Keep electrical cords out of the way.  Do not use floor polish or wax that makes floors slippery. If you must use wax, use non-skid floor wax.  Do not have throw rugs and other things on the floor that can make you trip. What can I do with my stairs?  Do not leave any items on the stairs.  Make sure that there are handrails on both sides of the stairs and use them. Fix handrails that are broken or loose. Make sure that handrails are as long as the stairways.  Check any carpeting to make sure that it is firmly attached to the stairs. Fix any carpet that is loose or worn.  Avoid having throw rugs at the top or bottom of the stairs. If you do have throw rugs, attach them to the floor with carpet tape.  Make sure that you have a light switch at the top of the stairs and the  bottom of the stairs. If you do not have them, ask someone to add them for you. What else can I do to help prevent falls?  Wear shoes that:  Do not have high heels.  Have rubber bottoms.  Are comfortable and fit you well.  Are closed at the toe. Do not wear sandals.  If you use a stepladder:  Make sure that it is fully opened. Do not climb a closed stepladder.  Make sure that both sides of the stepladder are locked into place.  Ask someone to hold it for you, if possible.  Clearly mark and make sure that you can see:  Any grab bars or handrails.  First and last steps.  Where the edge of each step is.  Use tools that help you move around (mobility aids) if they are needed. These include:  Canes.  Walkers.  Scooters.  Crutches.  Turn on the lights when you go into a dark area. Replace any light bulbs as soon as they burn out.  Set up your furniture so you have a clear path. Avoid moving your furniture around.  If any of your floors are uneven, fix them.  If there are any pets around you, be aware of where they are.  Review your medicines with your doctor. Some medicines can make you feel dizzy. This can increase your chance of falling. Ask your doctor what other things that you can do to help prevent falls. This information is not intended to replace advice given to you by your health care provider. Make sure you discuss any questions you have with your health care provider. Document Released: 10/13/2009 Document Revised: 05/24/2016 Document Reviewed: 01/21/2015 Elsevier Interactive Patient Education  2017 [employer]

## 2019-08-24 ENCOUNTER — Ambulatory Visit: Payer: Medicare Other

## 2019-08-25 ENCOUNTER — Other Ambulatory Visit: Payer: Self-pay

## 2019-08-25 ENCOUNTER — Encounter: Payer: Self-pay | Admitting: Family Medicine

## 2019-08-25 ENCOUNTER — Ambulatory Visit (INDEPENDENT_AMBULATORY_CARE_PROVIDER_SITE_OTHER): Payer: Medicare Other | Admitting: Family Medicine

## 2019-08-25 VITALS — BP 120/75 | HR 115 | Temp 97.7°F | Ht 65.0 in | Wt 224.0 lb

## 2019-08-25 DIAGNOSIS — Z23 Encounter for immunization: Secondary | ICD-10-CM

## 2019-08-25 DIAGNOSIS — N182 Chronic kidney disease, stage 2 (mild): Secondary | ICD-10-CM | POA: Diagnosis not present

## 2019-08-25 DIAGNOSIS — I10 Essential (primary) hypertension: Secondary | ICD-10-CM | POA: Diagnosis not present

## 2019-08-25 DIAGNOSIS — E782 Mixed hyperlipidemia: Secondary | ICD-10-CM

## 2019-08-25 DIAGNOSIS — D696 Thrombocytopenia, unspecified: Secondary | ICD-10-CM

## 2019-08-25 DIAGNOSIS — F411 Generalized anxiety disorder: Secondary | ICD-10-CM | POA: Diagnosis not present

## 2019-08-25 DIAGNOSIS — E1122 Type 2 diabetes mellitus with diabetic chronic kidney disease: Secondary | ICD-10-CM | POA: Diagnosis not present

## 2019-08-25 DIAGNOSIS — E1342 Other specified diabetes mellitus with diabetic polyneuropathy: Secondary | ICD-10-CM

## 2019-08-25 DIAGNOSIS — Z0000 Encounter for general adult medical examination without abnormal findings: Secondary | ICD-10-CM | POA: Diagnosis not present

## 2019-08-25 DIAGNOSIS — F332 Major depressive disorder, recurrent severe without psychotic features: Secondary | ICD-10-CM

## 2019-08-25 LAB — BAYER DCA HB A1C WAIVED: HB A1C (BAYER DCA - WAIVED): 6.8 % (ref <7.0)

## 2019-08-25 MED ORDER — VENLAFAXINE HCL ER 75 MG PO CP24: ORAL_CAPSULE | ORAL | 1 refills | Status: DC

## 2019-08-25 MED ORDER — SIMVASTATIN 10 MG PO TABS: 10.0000 mg | ORAL_TABLET | Freq: Every evening | ORAL | 1 refills | Status: DC

## 2019-08-25 MED ORDER — METFORMIN HCL 500 MG PO TABS: 1000.0000 mg | ORAL_TABLET | Freq: Every day | ORAL | 1 refills | Status: DC

## 2019-08-25 MED ORDER — DICLOFENAC SODIUM 1 % TD GEL: 4.0000 g | Freq: Four times a day (QID) | TRANSDERMAL | 12 refills | Status: AC

## 2019-08-25 MED ORDER — CLOTRIMAZOLE 1 % EX CREA: 1.0000 "application " | TOPICAL_CREAM | Freq: Two times a day (BID) | CUTANEOUS | 3 refills | Status: AC

## 2019-08-25 MED ORDER — ALBUTEROL SULFATE HFA 108 (90 BASE) MCG/ACT IN AERS: 2.0000 | INHALATION_SPRAY | Freq: Four times a day (QID) | RESPIRATORY_TRACT | 3 refills | Status: DC | PRN

## 2019-08-25 MED ORDER — VENLAFAXINE HCL ER 150 MG PO CP24: 150.0000 mg | ORAL_CAPSULE | Freq: Every day | ORAL | 1 refills | Status: DC

## 2019-08-25 MED ORDER — FLUTICASONE PROPIONATE 50 MCG/ACT NA SUSP: 2.0000 | Freq: Every day | NASAL | 3 refills | Status: DC

## 2019-08-25 NOTE — Assessment & Plan Note (Signed)
Rechecking labs today. Await results.  

## 2019-08-25 NOTE — Assessment & Plan Note (Signed)
Under good control on current regimen. Continue current regimen. Continue to monitor. Call with any concerns. Refills given. Labs drawn today.   

## 2019-08-25 NOTE — Patient Instructions (Addendum)
Preventative Services:  Health Risk Assessment and Personalized Prevention Plan: Done today Bone Mass Measurements: UTD Breast Cancer Screening: N/A CVD Screening: Done today Cervical Cancer Screening: N/A Colon Cancer Screening: N/A Depression Screening: Done today Diabetes Screening: Done today Glaucoma Screening: See your eye doctor Hepatitis B vaccine: N/A Hepatitis C screening: UTD HIV Screening: UTD Flu Vaccine: Done today Lung cancer Screening: N/A Obesity Screening: Done today Pneumonia Vaccines (2): UTD STI Screening: N/A   Health Maintenance After Age 20 After age 46, you are at a higher risk for certain long-term diseases and infections as well as injuries from falls. Falls are a major cause of broken bones and head injuries in people who are older than age 58. Getting regular preventive care can help to keep you healthy and well. Preventive care includes getting regular testing and making lifestyle changes as recommended by your health care provider. Talk with your health care provider about:  Which screenings and tests you should have. A screening is a test that checks for a disease when you have no symptoms.  A diet and exercise plan that is right for you. What should I know about screenings and tests to prevent falls? Screening and testing are the best ways to find a health problem early. Early diagnosis and treatment give you the best chance of managing medical conditions that are common after age 51. Certain conditions and lifestyle choices may make you more likely to have a fall. Your health care provider may recommend:  Regular vision checks. Poor vision and conditions such as cataracts can make you more likely to have a fall. If you wear glasses, make sure to get your prescription updated if your vision changes.  Medicine review. Work with your health care provider to regularly review all of the medicines you are taking, including over-the-counter medicines. Ask  your health care provider about any side effects that may make you more likely to have a fall. Tell your health care provider if any medicines that you take make you feel dizzy or sleepy.  Osteoporosis screening. Osteoporosis is a condition that causes the bones to get weaker. This can make the bones weak and cause them to break more easily.  Blood pressure screening. Blood pressure changes and medicines to control blood pressure can make you feel dizzy.  Strength and balance checks. Your health care provider may recommend certain tests to check your strength and balance while standing, walking, or changing positions.  Foot health exam. Foot pain and numbness, as well as not wearing proper footwear, can make you more likely to have a fall.  Depression screening. You may be more likely to have a fall if you have a fear of falling, feel emotionally low, or feel unable to do activities that you used to do.  Alcohol use screening. Using too much alcohol can affect your balance and may make you more likely to have a fall. What actions can I take to lower my risk of falls? General instructions  Talk with your health care provider about your risks for falling. Tell your health care provider if: ? You fall. Be sure to tell your health care provider about all falls, even ones that seem minor. ? You feel dizzy, sleepy, or off-balance.  Take over-the-counter and prescription medicines only as told by your health care provider. These include any supplements.  Eat a healthy diet and maintain a healthy weight. A healthy diet includes low-fat dairy products, low-fat (lean) meats, and fiber from whole grains, beans,  and lots of fruits and vegetables. Home safety  Remove any tripping hazards, such as rugs, cords, and clutter.  Install safety equipment such as grab bars in bathrooms and safety rails on stairs.  Keep rooms and walkways well-lit. Activity   Follow a regular exercise program to stay fit.  This will help you maintain your balance. Ask your health care provider what types of exercise are appropriate for you.  If you need a cane or walker, use it as recommended by your health care provider.  Wear supportive shoes that have nonskid soles. Lifestyle  Do not drink alcohol if your health care provider tells you not to drink.  If you drink alcohol, limit how much you have: ? 0-1 drink a day for women. ? 0-2 drinks a day for men.  Be aware of how much alcohol is in your drink. In the [place] one drink equals one typical bottle of beer (12 oz), one-half glass of wine (5 oz), or one shot of hard liquor (1 oz).  Do not use any products that contain nicotine or tobacco, such as cigarettes and e-cigarettes. If you need help quitting, ask your health care provider. Summary  Having a healthy lifestyle and getting preventive care can help to protect your health and wellness after age 27.  Screening and testing are the best way to find a health problem early and help you avoid having a fall. Early diagnosis and treatment give you the best chance for managing medical conditions that are more common for people who are older than age 45.  Falls are a major cause of broken bones and head injuries in people who are older than age 68. Take precautions to prevent a fall at home.  Work with your health care provider to learn what changes you can make to improve your health and wellness and to prevent falls. This information is not intended to replace advice given to you by your health care provider. Make sure you discuss any questions you have with your health care provider. Document Released: 10/30/2017 Document Revised: 04/09/2019 Document Reviewed: 10/30/2017 Elsevier Patient Education  2020 [employer].

## 2019-08-25 NOTE — Assessment & Plan Note (Signed)
Not doing great. Has 2 refills still on her xanax that have not been picked up. Will not refill today. Does not want to add other medicines now or talk to a counselor. Call with any concerns.

## 2019-08-25 NOTE — Assessment & Plan Note (Signed)
Under good control with A1c of 6.8 down from 7.1- continue diet and exercise. Call with any concerns.

## 2019-08-25 NOTE — Progress Notes (Signed)
BP 120/75   Pulse (!) 115   Temp 97.7 F (36.5 C) (Oral)   Ht 5\' 5"  (1.651 m)   Wt 224 lb (101.6 kg)   SpO2 91%   BMI 37.28 kg/m    Subjective:    Patient ID: Dawn Moore, female    DOB: Jun 18, 1941, 78 y.o.   MRN: [id number]  HPI: Dawn Moore is a 78 y.o. female presenting on 08/25/2019 for comprehensive medical examination. Current medical complaints include:  DIABETES Hypoglycemic episodes:no Polydipsia/polyuria: yes Visual disturbance: yes Chest pain: no Paresthesias: no Glucose Monitoring: no  Accucheck frequency: Not Checking Taking Insulin?: no Blood Pressure Monitoring: not checking Retinal Examination: Up to Date Foot Exam: Up to Date Diabetic Education: Completed Pneumovax: Up to Date Influenza: Up to Date Aspirin: no  HYPERTENSION / HYPERLIPIDEMIA Satisfied with current treatment? yes Duration of hypertension: chronic BP monitoring frequency: not checking BP medication side effects: no Past BP meds: none currently Duration of hyperlipidemia: chronic Cholesterol medication side effects: no Cholesterol supplements: none Past cholesterol medications: simvastatin Medication compliance: good compliance Aspirin: no Recent stressors: yes Recurrent headaches: no Visual changes: yes Palpitations: no Dyspnea: no Chest pain: no Lower extremity edema: no Dizzy/lightheaded: no  ANXIETY/DEPRESSION- son's murder trial got pushed again and daughter is not home as much. Duration:exacerbated Anxious mood: yes  Excessive worrying: yes Irritability: yes  Sweating: no Nausea: no Palpitations:no Hyperventilation: no Panic attacks: yes Agoraphobia: no  Obscessions/compulsions: no Depressed mood: yes Depression screen [facility] 2/9 08/25/2019 06/19/2019 05/18/2019 02/13/2019 07/22/2018  Decreased Interest 3 2 3 2 2   Down, Depressed, Hopeless 3 2 3 2 2   PHQ - 2 Score 6 4 6 4 4   Altered sleeping 3 1 3 2 3   Tired, decreased energy 3 2 3 2 3   Change in appetite 3 0 0  1 2  Feeling bad or failure about yourself  2 2 1 1 2   Trouble concentrating 3 1 2 3 2   Moving slowly or fidgety/restless 0 0 0 0 0  Suicidal thoughts 0 0 0 0 0  PHQ-9 Score 20 10 15 13 16   Difficult doing work/chores Very difficult Very difficult Very difficult - Very difficult  Some recent data might be hidden   GAD 7 : Generalized Anxiety Score 08/25/2019 06/19/2019 05/18/2019 02/13/2019  Nervous, Anxious, on Edge 2 1 3 2   Control/stop worrying 3 1 3 3   Worry too much - different things 3 1 3 3   Trouble relaxing 2 1 1 1   Restless 3 0 0 1  Easily annoyed or irritable 3 2 3 3   Afraid - awful might happen 0 1 0 0  Total GAD 7 Score 16 7 13 13   Anxiety Difficulty Not difficult at all Somewhat difficult Somewhat difficult Somewhat difficult   Anhedonia: no Weight changes: yes Insomnia: yes   Hypersomnia: no Fatigue/loss of energy: yes Feelings of worthlessness: yes Feelings of guilt: yes Impaired concentration/indecisiveness: yes Suicidal ideations: no  Crying spells: yes Recent Stressors/Life Changes: yes   Relationship problems: yes   Family stress: yes     Financial stress: yes    Job stress: no    Recent death/loss: no  She currently lives with: daughter Menopausal Symptoms: no  Functional Status Survey: Is the patient deaf or have difficulty hearing?: Yes Does the patient have difficulty seeing, even when wearing glasses/contacts?: Yes Does the patient have difficulty concentrating, remembering, or making decisions?: Yes Does the patient have difficulty walking or climbing stairs?: Yes Does the  patient have difficulty dressing or bathing?: No Does the patient have difficulty doing errands alone such as visiting a doctor's office or shopping?: No  Fall Risk  08/25/2019 07/22/2018 04/25/2018 07/23/2017 04/18/2017  Falls in the past year? 1 Yes Yes Yes Yes  Number falls in past yr: 1 2 or more 2 or more 2 or more 2 or more  Injury with Fall? 1 Yes Yes Yes Yes  Risk Factor  Category  - - High Fall Risk High Fall Risk -  Risk for fall due to : Impaired balance/gait;Impaired mobility;Impaired vision;History of fall(s) - - - -  Follow up Education provided;Falls evaluation completed;Falls prevention discussed - Falls prevention discussed;Falls evaluation completed - -    Depression Screen Depression screen [facility] 2/9 08/25/2019 06/19/2019 05/18/2019 02/13/2019 07/22/2018  Decreased Interest 3 2 3 2 2   Down, Depressed, Hopeless 3 2 3 2 2   PHQ - 2 Score 6 4 6 4 4   Altered sleeping 3 1 3 2 3   Tired, decreased energy 3 2 3 2 3   Change in appetite 3 0 0 1 2  Feeling bad or failure about yourself  2 2 1 1 2   Trouble concentrating 3 1 2 3 2   Moving slowly or fidgety/restless 0 0 0 0 0  Suicidal thoughts 0 0 0 0 0  PHQ-9 Score 20 10 15 13 16   Difficult doing work/chores Very difficult Very difficult Very difficult - Very difficult  Some recent data might be hidden    Advanced Directives Does patient have a HCPOA?    yes Does patient have a living will or MOST form?  yes  Past Medical History:  Past Medical History:  Diagnosis Date  . Anxiety   . Arthritis    OA knees, hands and feet  . Depression   . Diabetes mellitus without complication ([place])   . GERD (gastroesophageal reflux disease)   . Headache   . Osteopenia   . Shortness of breath dyspnea   . Sleep apnea    doesn't use CPAP  . 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 ABDOMINAL HYSTERECTOMY  1986   still has one ovary  . CATARACT EXTRACTION W/PHACO Left 03/07/2016   Procedure: CATARACT EXTRACTION PHACO AND INTRAOCULAR LENS PLACEMENT (IOC);  Surgeon: Leandrew Koyanagi, MD;  Location: [place];  Service: Ophthalmology;  Laterality: Left;  DIABETIC -oral meds Sleep apnea  . CATARACT EXTRACTION W/PHACO Right 03/28/2016   Procedure: CATARACT EXTRACTION PHACO AND INTRAOCULAR LENS PLACEMENT (IOC);  Surgeon:  Leandrew Koyanagi, MD;  Location: [place];  Service: Ophthalmology;  Laterality: Right;  DIABETIC - oral meds  . CHOLECYSTECTOMY  1999  . COCHLEAR IMPLANT    . FOOT FRACTURE SURGERY    . HEEL SPUR SURGERY Left 11/04/09   Revision 01/2011  . INTRAOCULAR LENS INSERTION Bilateral   . KYPHOPLASTY N/A 10/17/2017   Procedure: LUMBAR 3 KYPHOPLASTY; REQUEST 1 HOUR;  Surgeon: Phylliss Bob, MD;  Location: [place];  Service: Orthopedics;  Laterality: N/A;  LUMBAR 3 KYPHOPLASTY; REQUEST 1 HOUR    Medications:  Current Outpatient Medications on File Prior to Visit  Medication Sig  . ACCU-CHEK AVIVA PLUS test strip   . ACCU-CHEK SOFTCLIX LANCETS lancets   . ALPRAZolam (XANAX) 1 MG tablet Take 1 mg by mouth at bedtime as needed for anxiety. Takes during the day as needed.  . Cholecalciferol (VITAMIN D-3) 5000 units TABS  Take 5,000 Units by mouth daily.  . Cyanocobalamin (VITAMIN B 12 PO) Take by mouth daily.  . vitamin C (ASCORBIC ACID) 500 MG tablet Take 500 mg by mouth daily.   No current facility-administered medications on file prior to visit.     Allergies:  Allergies  Allergen Reactions  . Nsaids Other (See Comments)    Per Hematology  . Wellbutrin [Bupropion] Other (See Comments)    Thoughts that she would die    Social History:  Social History   Socioeconomic History  . Marital status: Widowed    Spouse name: Not on file  . Number of children: Not on file  . Years of education: Not on file  . Highest education level: Not on file  Occupational History  . Not on file  Social Needs  . Financial resource strain: Not hard at all  . Food insecurity    Worry: Never true    Inability: Never true  . Transportation needs    Medical: No    Non-medical: No  Tobacco Use  . Smoking status: Never Smoker  . Smokeless tobacco: Never Used  Substance and Sexual Activity  . Alcohol use: No    Alcohol/week: 0.0 standard drinks  . Drug use: No  . Sexual activity: Never     Birth control/protection: Surgical  Lifestyle  . Physical activity    Days per week: 0 days    Minutes per session: 0 min  . Stress: To some extent  Relationships  . Social connections    Talks on phone: More than three times a week    Gets together: More than three times a week    Attends religious service: Never    Active member of club or organization: No    Attends meetings of clubs or organizations: Never    Relationship status: Widowed  . Intimate partner violence    Fear of current or ex partner: No    Emotionally abused: No    Physically abused: No    Forced sexual activity: No  Other Topics Concern  . Not on file  Social History Narrative  . Not on file   Social History   Tobacco Use  Smoking Status Never Smoker  Smokeless Tobacco Never Used   Social History   Substance and Sexual Activity  Alcohol Use No  . Alcohol/week: 0.0 standard drinks    Family History:  Family History  Problem Relation Age of Onset  . Stroke Mother   . Dementia Mother   . Cancer Father        prostate  . Skin cancer Daughter   . Hyperlipidemia Son   . Hypertension Son   . Mental illness Son   . Alcohol abuse Son   . Arthritis Sister        rheumatoid  . Cancer Sister        breast  . Thyroid disease Sister   . Cancer Sister        thyroid  . Cancer Brother        prostate  . Arthritis Maternal Grandmother     Past medical history, surgical history, medications, allergies, family history and social history reviewed with patient today and changes made to appropriate areas of the chart.   Review of Systems  Constitutional: Negative.   HENT: Positive for hearing loss. Negative for congestion, ear discharge, ear pain, nosebleeds, sinus pain, sore throat and tinnitus.   Eyes: Positive for blurred vision. Negative for double vision, photophobia,  pain, discharge and redness.  Respiratory: Positive for wheezing. Negative for cough, hemoptysis, sputum production, shortness of  breath and stridor.   Cardiovascular: Negative.   Gastrointestinal: Negative.   Genitourinary: Negative.   Musculoskeletal: Positive for joint pain. Negative for back pain, falls, myalgias and neck pain.  Skin: Negative.   Neurological: Negative.   Endo/Heme/Allergies: Positive for polydipsia. Negative for environmental allergies. Bruises/bleeds easily.  Psychiatric/Behavioral: Positive for depression. Negative for hallucinations, memory loss, substance abuse and suicidal ideas. The patient is nervous/anxious. The patient does not have insomnia.     All other ROS negative except what is listed above and in the HPI.      Objective:    BP 120/75   Pulse (!) 115   Temp 97.7 F (36.5 C) (Oral)   Ht 5\' 5"  (1.651 m)   Wt 224 lb (101.6 kg)   SpO2 91%   BMI 37.28 kg/m   Wt Readings from Last 3 Encounters:  08/25/19 224 lb (101.6 kg)  05/18/19 218 lb 8 oz (99.1 kg)  02/13/19 210 lb (95.3 kg)    Physical Exam Vitals signs and nursing note reviewed.  Constitutional:      General: She is not in acute distress.    Appearance: Normal appearance. She is not ill-appearing, toxic-appearing or diaphoretic.  HENT:     Head: Normocephalic and atraumatic.     Right Ear: Tympanic membrane, ear canal and external ear normal. There is no impacted cerumen.     Left Ear: Tympanic membrane, ear canal and external ear normal. There is no impacted cerumen.     Nose: Nose normal. No congestion or rhinorrhea.     Mouth/Throat:     Mouth: Mucous membranes are moist.     Pharynx: Oropharynx is clear. No oropharyngeal exudate or posterior oropharyngeal erythema.  Eyes:     General: No scleral icterus.       Right eye: No discharge.        Left eye: No discharge.     Extraocular Movements: Extraocular movements intact.     Conjunctiva/sclera: Conjunctivae normal.     Pupils: Pupils are equal, round, and reactive to light.  Neck:     Musculoskeletal: Normal range of motion and neck supple. No neck  rigidity or muscular tenderness.     Vascular: No carotid bruit.  Cardiovascular:     Rate and Rhythm: Normal rate and regular rhythm.     Pulses: Normal pulses.     Heart sounds: No murmur. No friction rub. No gallop.   Pulmonary:     Effort: Pulmonary effort is normal. No respiratory distress.     Breath sounds: Normal breath sounds. No stridor. No wheezing, rhonchi or rales.  Chest:     Chest wall: No tenderness.  Abdominal:     General: Abdomen is flat. Bowel sounds are normal. There is no distension.     Palpations: Abdomen is soft. There is no mass.     Tenderness: There is no abdominal tenderness. There is no right CVA tenderness, left CVA tenderness, guarding or rebound.     Hernia: No hernia is present.  Genitourinary:    Comments: Breast and pelvic exams deferred with shared decision making Musculoskeletal:        General: No swelling, tenderness, deformity or signs of injury.     Right lower leg: No edema.     Left lower leg: No edema.  Lymphadenopathy:     Cervical: No cervical adenopathy.  Skin:  General: Skin is warm and dry.     Capillary Refill: Capillary refill takes less than 2 seconds.     Coloration: Skin is not jaundiced or pale.     Findings: No bruising, erythema, lesion or rash.  Neurological:     General: No focal deficit present.     Mental Status: She is alert and oriented to person, place, and time. Mental status is at baseline.     Cranial Nerves: No cranial nerve deficit.     Sensory: No sensory deficit.     Motor: No weakness.     Coordination: Coordination normal.     Gait: Gait normal.     Deep Tendon Reflexes: Reflexes normal.  Psychiatric:        Mood and Affect: Mood normal.        Behavior: Behavior normal.        Thought Content: Thought content normal.        Judgment: Judgment normal.     6CIT Screen 08/25/2019 04/25/2018 04/18/2017  What Year? 0 points 0 points 0 points  What month? 0 points 0 points 0 points  What time? 0  points 0 points 0 points  Count back from 20 0 points 0 points 0 points  Months in reverse 0 points 0 points 0 points  Repeat phrase 4 points 0 points 0 points  Total Score 4 0 0      Results for orders placed or performed in visit on 06/01/19  Microscopic Examination   URINE  Result Value Ref Range   WBC, UA 0-5 0 - 5 /hpf   RBC None seen 0 - 2 /hpf   Epithelial Cells (non renal) 0-10 0 - 10 /hpf   Bacteria, UA Few (A) None seen/Few  Urine Culture, Reflex   URINE  Result Value Ref Range   Urine Culture, Routine Final report    Organism ID, Bacteria Comment   UA/M w/rflx Culture, Routine   Specimen: Urine   URINE  Result Value Ref Range   Specific Gravity, UA 1.010 1.005 - 1.030   pH, UA 6.0 5.0 - 7.5   Color, UA Yellow Yellow   Appearance Ur Clear Clear   Leukocytes,UA 1+ (A) Negative   Protein,UA Negative Negative/Trace   Glucose, UA Negative Negative   Ketones, UA Negative Negative   RBC, UA Negative Negative   Bilirubin, UA Negative Negative   Urobilinogen, Ur 0.2 0.2 - 1.0 mg/dL   Nitrite, UA Negative Negative   Microscopic Examination See below:    Urinalysis Reflex Comment       Assessment & Plan:   Problem List Items Addressed This Visit      Cardiovascular and Mediastinum   HTN (hypertension)    Under good control on current regimen. Continue current regimen. Continue to monitor. Call with any concerns. Refills given. Labs drawn today.       Relevant Medications   simvastatin (ZOCOR) 10 MG tablet   Other Relevant Orders   CBC with Differential/Platelet   Comprehensive metabolic panel   Microalbumin, Urine Waived   UA/M w/rflx Culture, Routine     Endocrine   Diabetes mellitus with renal manifestations, controlled ([place])    Under good control with A1c of 6.8 down from 7.1- continue diet and exercise. Call with any concerns.       Relevant Medications   simvastatin (ZOCOR) 10 MG tablet   metFORMIN (GLUCOPHAGE) 500 MG tablet   Other Relevant  Orders   Bayer DCA Hb  A1c Waived   CBC with Differential/Platelet   Comprehensive metabolic panel   UA/M w/rflx Culture, Routine     Nervous and Auditory   Diabetic neuropathy ([facility])    Under good control on current regimen. Continue current regimen. Continue to monitor. Call with any concerns. Refills given. Labs drawn today.      Relevant Medications   simvastatin (ZOCOR) 10 MG tablet   metFORMIN (GLUCOPHAGE) 500 MG tablet   Other Relevant Orders   Bayer DCA Hb A1c Waived   CBC with Differential/Platelet   Comprehensive metabolic panel   Microalbumin, Urine Waived   UA/M w/rflx Culture, Routine     Other   Anxiety disorder    Not doing great. Has 2 refills still on her xanax that have not been picked up. Will not refill today. Does not want to add other medicines now or talk to a counselor. Call with any concerns.       Relevant Medications   venlafaxine XR (EFFEXOR-XR) 150 MG 24 hr capsule   venlafaxine XR (EFFEXOR-XR) 75 MG 24 hr capsule   Other Relevant Orders   CBC with Differential/Platelet   Comprehensive metabolic panel   TSH   UA/M w/rflx Culture, Routine   Thrombocytopenia ([place])    Rechecking labs today. Await results.       Relevant Orders   CBC with Differential/Platelet   Comprehensive metabolic panel   UA/M w/rflx Culture, Routine   Depression    Not doing great. Has 2 refills still on her xanax that have not been picked up. Will not refill today. Does not want to add other medicines now or talk to a counselor. Call with any concerns.       Relevant Medications   venlafaxine XR (EFFEXOR-XR) 150 MG 24 hr capsule   venlafaxine XR (EFFEXOR-XR) 75 MG 24 hr capsule   Other Relevant Orders   CBC with Differential/Platelet   Comprehensive metabolic panel   UA/M w/rflx Culture, Routine   Hyperlipidemia    Under good control on current regimen. Continue current regimen. Continue to monitor. Call with any concerns. Refills given. Labs drawn today.        Relevant Medications   simvastatin (ZOCOR) 10 MG tablet   Other Relevant Orders   CBC with Differential/Platelet   Comprehensive metabolic panel   Lipid Panel w/o Chol/HDL Ratio   UA/M w/rflx Culture, Routine    Other Visit Diagnoses    Encounter for Medicare annual wellness exam    -  Primary   Preventative care discussed today as below.    Routine general medical examination at a health care facility       Vaccines up to date. Screening labs checked today. Mammo and colonoscopy and pap N/A. DEXA up to date. Call with any concerns continue to monitor.    Flu vaccine need       Flu shot given today.   Relevant Orders   Flu Vaccine QUAD High Dose(Fluad) (Completed)       Preventative Services:  Health Risk Assessment and Personalized Prevention Plan: Done today Bone Mass Measurements: UTD Breast Cancer Screening: N/A CVD Screening: Done today Cervical Cancer Screening: N/A Colon Cancer Screening: N/A Depression Screening: Done today Diabetes Screening: Done today Glaucoma Screening: See your eye doctor Hepatitis B vaccine: N/A Hepatitis C screening: UTD HIV Screening: UTD Flu Vaccine: Done today Lung cancer Screening: N/A Obesity Screening: Done today Pneumonia Vaccines (2): UTD STI Screening: N/A  Follow up plan: Return in about 3 months (around  11/25/2019).   LABORATORY TESTING:  - Pap smear: not applicable  IMMUNIZATIONS:   - Tdap: Tetanus vaccination status reviewed: last tetanus booster within 10 years. - Influenza: Administered today - Pneumovax: Up to date - Prevnar: Up to date - Zostavax vaccine: Up to date  SCREENING: -Mammogram: Not applicable  - Colonoscopy: Not applicable  - Bone Density: Up to date   PATIENT COUNSELING:   Advised to take 1 mg of folate supplement per day if capable of pregnancy.   Sexuality: Discussed sexually transmitted diseases, partner selection, use of condoms, avoidance of unintended pregnancy  and contraceptive  alternatives.   Advised to avoid cigarette smoking.  I discussed with the patient that most people either abstain from alcohol or drink within safe limits (<=14/week and <=4 drinks/occasion for males, <=7/weeks and <= 3 drinks/occasion for females) and that the risk for alcohol disorders and other health effects rises proportionally with the number of drinks per week and how often a drinker exceeds daily limits.  Discussed cessation/primary prevention of drug use and availability of treatment for abuse.   Diet: Encouraged to adjust caloric intake to maintain  or achieve ideal body weight, to reduce intake of dietary saturated fat and total fat, to limit sodium intake by avoiding high sodium foods and not adding table salt, and to maintain adequate dietary potassium and calcium preferably from fresh fruits, vegetables, and low-fat dairy products.    stressed the importance of regular exercise  Injury prevention: Discussed safety belts, safety helmets, smoke detector, smoking near bedding or upholstery.   Dental health: Discussed importance of regular tooth brushing, flossing, and dental visits.    NEXT PREVENTATIVE PHYSICAL DUE IN 1 YEAR. Return in about 3 months (around 11/25/2019).

## 2019-08-26 ENCOUNTER — Encounter: Payer: Self-pay | Admitting: Family Medicine

## 2019-08-26 ENCOUNTER — Ambulatory Visit: Payer: Medicare HMO | Admitting: Urology

## 2019-08-26 LAB — COMPREHENSIVE METABOLIC PANEL
ALT: 21 IU/L (ref 0–32)
AST: 44 IU/L — ABNORMAL HIGH (ref 0–40)
Albumin/Globulin Ratio: 1.8 (ref 1.2–2.2)
Albumin: 4.2 g/dL (ref 3.7–4.7)
Alkaline Phosphatase: 87 IU/L (ref 39–117)
BUN/Creatinine Ratio: 11 — ABNORMAL LOW (ref 12–28)
BUN: 10 mg/dL (ref 8–27)
Bilirubin Total: 0.7 mg/dL (ref 0.0–1.2)
CO2: 23 mmol/L (ref 20–29)
Calcium: 9.2 mg/dL (ref 8.7–10.3)
Chloride: 98 mmol/L (ref 96–106)
Creatinine, Ser: 0.87 mg/dL (ref 0.57–1.00)
GFR calc Af Amer: 74 mL/min/{1.73_m2} (ref >59)
GFR calc non Af Amer: 64 mL/min/{1.73_m2} (ref >59)
Globulin, Total: 2.4 g/dL (ref 1.5–4.5)
Glucose: 152 mg/dL — ABNORMAL HIGH (ref 65–99)
Potassium: 4.7 mmol/L (ref 3.5–5.2)
Sodium: 140 mmol/L (ref 134–144)
Total Protein: 6.6 g/dL (ref 6.0–8.5)

## 2019-08-26 LAB — CBC WITH DIFFERENTIAL/PLATELET
Basophils Absolute: 0 10*3/uL (ref 0.0–0.2)
Basos: 1 %
EOS (ABSOLUTE): 0.1 10*3/uL (ref 0.0–0.4)
Eos: 1 %
Hematocrit: 41.1 % (ref 34.0–46.6)
Hemoglobin: 13.3 g/dL (ref 11.1–15.9)
Immature Grans (Abs): 0 10*3/uL (ref 0.0–0.1)
Immature Granulocytes: 0 %
Lymphocytes Absolute: 1.5 10*3/uL (ref 0.7–3.1)
Lymphs: 32 %
MCH: 29.4 pg (ref 26.6–33.0)
MCHC: 32.4 g/dL (ref 31.5–35.7)
MCV: 91 fL (ref 79–97)
Monocytes Absolute: 0.3 10*3/uL (ref 0.1–0.9)
Monocytes: 7 %
Neutrophils Absolute: 2.8 10*3/uL (ref 1.4–7.0)
Neutrophils: 59 %
Platelets: 139 10*3/uL — ABNORMAL LOW (ref 150–450)
RBC: 4.53 x10E6/uL (ref 3.77–5.28)
RDW: 14.4 % (ref 11.7–15.4)
WBC: 4.7 10*3/uL (ref 3.4–10.8)

## 2019-08-26 LAB — LIPID PANEL W/O CHOL/HDL RATIO
Cholesterol, Total: 127 mg/dL (ref 100–199)
HDL: 54 mg/dL (ref >39)
LDL Calculated: 46 mg/dL (ref 0–99)
Triglycerides: 133 mg/dL (ref 0–149)
VLDL Cholesterol Cal: 27 mg/dL (ref 5–40)

## 2019-08-26 LAB — TSH: TSH: 2.06 u[IU]/mL (ref 0.450–4.500)

## 2019-09-30 ENCOUNTER — Ambulatory Visit: Payer: Self-pay | Admitting: Licensed Clinical Social Worker

## 2019-09-30 NOTE — Chronic Care Management (AMB) (Signed)
  Care Management   Follow Up Note   09/30/2019 Name: Dawn Moore MRN: [id number] DOB: 1941-07-25  Referred by: Valerie Roys, DO Reason for referral : Care Coordination   Dawn Moore is a 78 y.o. year old female who is a primary care patient of Valerie Roys, DO. The care management team was consulted for assistance with care management and care coordination needs.    Review of patient status, including review of consultants reports, relevant laboratory and other test results, and collaboration with appropriate care team members and the patient's provider was performed as part of comprehensive patient evaluation and provision of chronic care management services.    LCSW completed CCM outreach attempt today but was unable to reach patient successfully. A HIPPA compliant voice message was left encouraging patient to return call once available. LCSW rescheduled CCM SW appointment as well.  A HIPPA compliant phone message was left for the patient providing contact information and requesting a return call.   Eula Fried, BSW, MSW, [facility] Care Management [email or web address] Phone: [phone]

## 2019-10-15 DIAGNOSIS — H26492 Other secondary cataract, left eye: Secondary | ICD-10-CM | POA: Diagnosis not present

## 2019-10-15 LAB — HM DIABETES EYE EXAM

## 2019-10-22 ENCOUNTER — Telehealth: Payer: Self-pay

## 2019-10-22 NOTE — Telephone Encounter (Signed)
Called and left a message asking patient to return my call.   Copied from [place] [id number]. Topic: General - Call Back - No Documentation >> Oct 22, 2019  4:03 PM Erick Blinks wrote: Pt called requesting call back from clinical staff regarding recent exam of right arm (involving green devices on fingers) please advise  Best contact: [phone]

## 2019-10-23 NOTE — Telephone Encounter (Signed)
Verbal from Dr.Johnson, that this is not new and that she has been on medication for this. Patient noticed by Christan.

## 2019-10-23 NOTE — Telephone Encounter (Signed)
OK- does she want to see vascular for it, or just talk to me about it at her follow up

## 2019-10-23 NOTE — Telephone Encounter (Signed)
Spoke with patient, she had a nurse come out and they placed a green device like a clothes pin on each index finger, she told the patient that the right arm something was shrinking, patient is not sure what she was talking about, but they were going to send over paperwork about it. Severe PAD right arm, is what is on paperwork that Dawn Moore has.

## 2019-10-23 NOTE — Telephone Encounter (Signed)
Called and left a message for patient to return my call.  

## 2019-10-26 ENCOUNTER — Ambulatory Visit (INDEPENDENT_AMBULATORY_CARE_PROVIDER_SITE_OTHER): Payer: Medicare Other | Admitting: Family Medicine

## 2019-10-26 ENCOUNTER — Encounter: Payer: Self-pay | Admitting: Family Medicine

## 2019-10-26 ENCOUNTER — Other Ambulatory Visit: Payer: Self-pay

## 2019-10-26 VITALS — BP 127/82 | HR 84 | Temp 98.7°F | Ht 65.0 in | Wt 222.0 lb

## 2019-10-26 DIAGNOSIS — E1122 Type 2 diabetes mellitus with diabetic chronic kidney disease: Secondary | ICD-10-CM

## 2019-10-26 DIAGNOSIS — F411 Generalized anxiety disorder: Secondary | ICD-10-CM

## 2019-10-26 DIAGNOSIS — I739 Peripheral vascular disease, unspecified: Secondary | ICD-10-CM | POA: Diagnosis not present

## 2019-10-26 DIAGNOSIS — F332 Major depressive disorder, recurrent severe without psychotic features: Secondary | ICD-10-CM | POA: Diagnosis not present

## 2019-10-26 DIAGNOSIS — N182 Chronic kidney disease, stage 2 (mild): Secondary | ICD-10-CM | POA: Diagnosis not present

## 2019-10-26 MED ORDER — ASPIRIN EC 81 MG PO TBEC: 81.0000 mg | DELAYED_RELEASE_TABLET | Freq: Every day | ORAL | 3 refills | Status: DC

## 2019-10-26 NOTE — Assessment & Plan Note (Addendum)
Exacerbated. Not really interested in changing medicine or seeing a counselor or psychiatrist right now- but states that she will try- new referral generated today. Has not picked up a xanax Rx since May and still has 2 refills at the pharmacy. Will not refill today. Continue to monitor. Call with any concerns.

## 2019-10-26 NOTE — Assessment & Plan Note (Signed)
Under good control with A1c of 6.3- continue current regimen. Continue to monitor. Call with any concerns.

## 2019-10-26 NOTE — Progress Notes (Signed)
BP 127/82   Pulse 84   Temp 98.7 F (37.1 C) (Oral)   Ht 5\' 5"  (1.651 m)   Wt 222 lb (100.7 kg)   SpO2 90%   BMI 36.94 kg/m    Subjective:    Patient ID: Dawn Moore, female    DOB: [birth year], 78 y.o.   MRN: [id number]  HPI: Dawn Moore is a 78 y.o. female  Chief Complaint  Patient presents with  . Follow-up    pt states that a [facility] doctor came to her house and told patient that she has some kind of artery diseade on her right arm   Had a nurse come out to the house through [facility] and she told her she had peripheral vascular disease. This has made Dawn Moore very anxious.   DIABETES Hypoglycemic episodes:no Polydipsia/polyuria: no Visual disturbance: no Chest pain: no Paresthesias: no Glucose Monitoring: no Taking Insulin?: no Blood Pressure Monitoring: not checking Retinal Examination: Up to Date Foot Exam: Up to Date Diabetic Education: Completed Pneumovax: Up to Date Influenza: Up to Date Aspirin: yes- started today  ANXIETY/STRESS- her son's murder trial has been pushed again. Has been having other issues with her family Duration:exacerbated Anxious mood: yes  Excessive worrying: yes Irritability: yes  Sweating: no Nausea: no Palpitations:no Hyperventilation: no Panic attacks: yes Agoraphobia: no  Obscessions/compulsions: no Depressed mood: yes Depression screen [facility] 2/9 10/26/2019 08/25/2019 06/19/2019 05/18/2019 02/13/2019  Decreased Interest 3 3 2 3 2   Down, Depressed, Hopeless 3 3 2 3 2   PHQ - 2 Score 6 6 4 6 4   Altered sleeping 3 3 1 3 2   Tired, decreased energy 3 3 2 3 2   Change in appetite 3 3 0 0 1  Feeling bad or failure about yourself  3 2 2 1 1   Trouble concentrating 3 3 1 2 3   Moving slowly or fidgety/restless 0 0 0 0 0  Suicidal thoughts 0 0 0 0 0  PHQ-9 Score 21 20 10 15 13   Difficult doing work/chores Extremely dIfficult Very difficult Very difficult Very difficult -  Some recent data might be hidden   GAD 7 : Generalized Anxiety  Score 10/26/2019 08/25/2019 06/19/2019 05/18/2019  Nervous, Anxious, on Edge 3 2 1 3   Control/stop worrying 3 3 1 3   Worry too much - different things 3 3 1 3   Trouble relaxing 3 2 1 1   Restless 3 3 0 0  Easily annoyed or irritable 3 3 2 3   Afraid - awful might happen 3 0 1 0  Total GAD 7 Score 21 16 7 13   Anxiety Difficulty Extremely difficult Not difficult at all Somewhat difficult Somewhat difficult   Anhedonia: no Weight changes: no Insomnia: yes hard to fall asleep  Hypersomnia: no Fatigue/loss of energy: yes Feelings of worthlessness: yes Feelings of guilt: yes Impaired concentration/indecisiveness: yes Suicidal ideations: no  Crying spells: yes Recent Stressors/Life Changes: yes  Relevant past medical, surgical, family and social history reviewed and updated as indicated. Interim medical history since our last visit reviewed. Allergies and medications reviewed and updated.  Review of Systems  Constitutional: Negative.   Respiratory: Negative.   Cardiovascular: Negative.   Musculoskeletal: Negative.   Neurological: Negative.   Psychiatric/Behavioral: Positive for dysphoric mood. Negative for agitation, behavioral problems, confusion, decreased concentration, hallucinations, self-injury, sleep disturbance and suicidal ideas. The patient is nervous/anxious. The patient is not hyperactive.     Per HPI unless specifically indicated above     Objective:  BP 127/82   Pulse 84   Temp 98.7 F (37.1 C) (Oral)   Ht 5\' 5"  (1.651 m)   Wt 222 lb (100.7 kg)   SpO2 90%   BMI 36.94 kg/m   Wt Readings from Last 3 Encounters:  10/26/19 222 lb (100.7 kg)  08/25/19 224 lb (101.6 kg)  05/18/19 218 lb 8 oz (99.1 kg)    Physical Exam Vitals signs and nursing note reviewed.  Constitutional:      General: She is not in acute distress.    Appearance: Normal appearance. She is not ill-appearing, toxic-appearing or diaphoretic.  HENT:     Head: Normocephalic and atraumatic.      Right Ear: External ear normal.     Left Ear: External ear normal.     Nose: Nose normal.     Mouth/Throat:     Mouth: Mucous membranes are moist.     Pharynx: Oropharynx is clear.  Eyes:     General: No scleral icterus.       Right eye: No discharge.        Left eye: No discharge.     Extraocular Movements: Extraocular movements intact.     Conjunctiva/sclera: Conjunctivae normal.     Pupils: Pupils are equal, round, and reactive to light.  Neck:     Musculoskeletal: Normal range of motion and neck supple.  Cardiovascular:     Rate and Rhythm: Normal rate and regular rhythm.     Pulses: Normal pulses.     Heart sounds: Normal heart sounds. No murmur. No friction rub. No gallop.   Pulmonary:     Effort: Pulmonary effort is normal. No respiratory distress.     Breath sounds: Normal breath sounds. No stridor. No wheezing, rhonchi or rales.  Chest:     Chest wall: No tenderness.  Musculoskeletal: Normal range of motion.  Skin:    General: Skin is warm and dry.     Capillary Refill: Capillary refill takes less than 2 seconds.     Coloration: Skin is not jaundiced or pale.     Findings: No bruising, erythema, lesion or rash.  Neurological:     General: No focal deficit present.     Mental Status: She is alert and oriented to person, place, and time. Mental status is at baseline.  Psychiatric:        Mood and Affect: Mood normal.        Behavior: Behavior normal.        Thought Content: Thought content normal.        Judgment: Judgment normal.     Results for orders placed or performed in visit on 10/16/19  HM DIABETES EYE EXAM  Result Value Ref Range   HM Diabetic Eye Exam No Retinopathy No Retinopathy      Assessment & Plan:   Problem List Items Addressed This Visit      Cardiovascular and Mediastinum   Peripheral vascular disease ([place])    Will continue to keep BP, cholesterol and sugars under good control. Start baby aspirin. Referral to vascular surgery made today.  Await their input. Call with any concerns.       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 Continue to monitor. Call with any  concerns.       Relevant Medications   aspirin EC 81 MG tablet   Other Relevant Orders   Microalbumin, Urine Waived   Bayer DCA Hb A1c Waived   UA/M w/rflx Culture, Routine     Other   Anxiety disorder    Exacerbated. Not really interested in changing medicine or seeing a counselor or psychiatrist right now- but states that she will try- new referral generated today. Has not picked up a xanax Rx since May and still has 2 refills at the pharmacy. Will not refill today. Continue to monitor. Call with any concerns.       Relevant Orders   Ambulatory referral to Psychiatry   Depression    Exacerbated. Not really interested in changing medicine or seeing a counselor or psychiatrist right now- but states that she will try- new referral generated today. Has not picked up a xanax Rx since May and still has 2 refills at the pharmacy. Will not refill today. Continue to monitor. Call with any concerns.       Relevant Orders   Ambulatory referral to Psychiatry       Follow up plan: Return in about 3 months (around 01/26/2020).

## 2019-10-26 NOTE — Assessment & Plan Note (Addendum)
Will continue to keep BP, cholesterol and sugars under good control. Start baby aspirin. Referral to vascular surgery made today. Await their input. Call with any concerns.

## 2019-10-28 LAB — UA/M W/RFLX CULTURE, ROUTINE
Bilirubin, UA: NEGATIVE
Glucose, UA: NEGATIVE
Ketones, UA: NEGATIVE
Nitrite, UA: NEGATIVE
Protein,UA: NEGATIVE
RBC, UA: NEGATIVE
Specific Gravity, UA: 1.02 (ref 1.005–1.030)
Urobilinogen, Ur: 1 mg/dL (ref 0.2–1.0)
pH, UA: 7 (ref 5.0–7.5)

## 2019-10-28 LAB — MICROSCOPIC EXAMINATION
Bacteria, UA: NONE SEEN
RBC, Urine: NONE SEEN /hpf (ref 0–2)

## 2019-10-28 LAB — BAYER DCA HB A1C WAIVED: HB A1C (BAYER DCA - WAIVED): 6.3 % (ref <7.0)

## 2019-10-28 LAB — MICROALBUMIN, URINE WAIVED
Creatinine, Urine Waived: 100 mg/dL (ref 10–300)
Microalb, Ur Waived: 10 mg/L (ref 0–19)
Microalb/Creat Ratio: <30 mg/g (ref <30)

## 2019-10-28 LAB — URINE CULTURE, REFLEX

## 2019-10-30 ENCOUNTER — Encounter: Payer: Self-pay | Admitting: Family Medicine

## 2019-11-03 DIAGNOSIS — H26492 Other secondary cataract, left eye: Secondary | ICD-10-CM | POA: Diagnosis not present

## 2019-11-18 ENCOUNTER — Telehealth: Payer: Self-pay

## 2019-11-24 ENCOUNTER — Ambulatory Visit: Payer: Medicare Other | Admitting: Family Medicine

## 2019-12-07 ENCOUNTER — Other Ambulatory Visit: Payer: Self-pay

## 2019-12-07 NOTE — Patient Outreach (Signed)
[facility]) Care Management  12/07/2019  FREYA RADERMACHER [birth year] [id number]   Medication Adherence call to Mrs. Baruch Gouty Hippa Identifiers Verify spoke with patient she is past due on Simvastatin 10 mg and Metformin 500 mg pt explain she takes both medication,patien will check if she needs both medications and will call back methicillin-resistant Staph aureus. [place] is showing past due under [place].   [facility] Management Direct Dial [phone]  Fax [phone] [email or web address]

## 2019-12-17 ENCOUNTER — Other Ambulatory Visit: Payer: Self-pay

## 2019-12-17 NOTE — Patient Outreach (Signed)
[facility]) Care Management  12/17/2019  [name] 1941/01/07 [id number]   Medication Adherence call to Mrs. Dawn Moore Hippa Identifiers Verify spoke with patient she is showing past due on Metformin 500 mg and Simvastatin 10 mg,patient explain she takes  Both medications, patient explain she has plenty on Metformin but ask to call [facility] on Simvastatin,[facility] will fill and send out with in 5-7 days. Dawn Moore is showing past due under Dawn Moore.   [place] Management Direct Dial [phone]  Fax [phone] [email or web address]

## 2020-01-01 DIAGNOSIS — G459 Transient cerebral ischemic attack, unspecified: Secondary | ICD-10-CM

## 2020-01-01 HISTORY — DX: Transient cerebral ischemic attack, unspecified: G45.9

## 2020-01-05 ENCOUNTER — Other Ambulatory Visit: Payer: Self-pay | Admitting: Family Medicine

## 2020-01-05 NOTE — Telephone Encounter (Signed)
Medication Refill - Medication:  ALPRAZolam (XANAX) 1 MG tablet [id number]  Has the patient contacted their pharmacy? Yes advised to call office. Pt would like 90 day supply  Preferred Pharmacy (with phone number or street name):  [place] [address]  Agent: Please be advised that RX refills may take up to 3 business days. We ask that you follow-up with your pharmacy.

## 2020-01-05 NOTE — Telephone Encounter (Signed)
Spoke with patient's daughter, she will check to see if she has enough to last until her scheduled appt, if not she will call and schedule a follow up.

## 2020-01-05 NOTE — Telephone Encounter (Signed)
Cannot get that through mail order.  Due for appointment Per last note:  Has not picked up a xanax Rx since May and still has 2 refills at the pharmacy. Should have Rxs at pharmacy. If she's used that much extra since October- will need appointment

## 2020-01-12 ENCOUNTER — Telehealth: Payer: Self-pay

## 2020-01-12 NOTE — Telephone Encounter (Signed)
Called and left a message for patient asking her to return my call to discuss her pain and see if she would like to schedule an office visit.  Copied from [place] [phone]. Topic: General - Other >> Jan 12, 2020  1:55 PM Greggory Keen D wrote: Reason for CRM: Pt called saying she was havin pain rt side shoulder, hip, leg.  She does not recall doing anything to her right side.  She has an appt on the 26th.  She is not taking anything OTC.  Please advise.  CB# [phone]

## 2020-01-14 NOTE — Telephone Encounter (Signed)
Patient notified

## 2020-01-14 NOTE — Telephone Encounter (Signed)
She can absolutely continue her voltaren- that's a great option. She can also take some tylenol or aleve. If it's not getting better, we should probably see her.

## 2020-01-14 NOTE — Telephone Encounter (Signed)
Is there anything that she can take otc for the back., hip, ankle and shoulder pain on the right side. Patient used Voltaren gel that helped her for a bit, and has tried a cream with hemp in it. She has been taking Tylenol otc.

## 2020-01-14 NOTE — Telephone Encounter (Signed)
Can she get a prescription for a cane?

## 2020-01-14 NOTE — Telephone Encounter (Signed)
Yes. Write out cane #1, dx: impaired balance and I'll sign it tomorrow

## 2020-01-22 DIAGNOSIS — M1990 Unspecified osteoarthritis, unspecified site: Secondary | ICD-10-CM | POA: Diagnosis not present

## 2020-01-26 ENCOUNTER — Telehealth: Payer: Self-pay | Admitting: Family Medicine

## 2020-01-26 ENCOUNTER — Other Ambulatory Visit: Payer: Self-pay

## 2020-01-26 ENCOUNTER — Ambulatory Visit: Payer: Medicare Other | Admitting: Family Medicine

## 2020-01-26 ENCOUNTER — Ambulatory Visit (INDEPENDENT_AMBULATORY_CARE_PROVIDER_SITE_OTHER): Payer: Medicare HMO | Admitting: Family Medicine

## 2020-01-26 ENCOUNTER — Encounter: Payer: Self-pay | Admitting: Family Medicine

## 2020-01-26 VITALS — BP 156/85 | HR 84 | Temp 98.7°F

## 2020-01-26 DIAGNOSIS — N182 Chronic kidney disease, stage 2 (mild): Secondary | ICD-10-CM | POA: Diagnosis not present

## 2020-01-26 DIAGNOSIS — F411 Generalized anxiety disorder: Secondary | ICD-10-CM

## 2020-01-26 DIAGNOSIS — E1122 Type 2 diabetes mellitus with diabetic chronic kidney disease: Secondary | ICD-10-CM

## 2020-01-26 DIAGNOSIS — F332 Major depressive disorder, recurrent severe without psychotic features: Secondary | ICD-10-CM

## 2020-01-26 DIAGNOSIS — R69 Illness, unspecified: Secondary | ICD-10-CM | POA: Diagnosis not present

## 2020-01-26 LAB — BAYER DCA HB A1C WAIVED: HB A1C (BAYER DCA - WAIVED): 6.2 % (ref <7.0)

## 2020-01-26 MED ORDER — VENLAFAXINE HCL ER 75 MG PO CP24: ORAL_CAPSULE | ORAL | 1 refills | Status: DC

## 2020-01-26 MED ORDER — ALBUTEROL SULFATE HFA 108 (90 BASE) MCG/ACT IN AERS: 2.0000 | INHALATION_SPRAY | Freq: Four times a day (QID) | RESPIRATORY_TRACT | 3 refills | Status: DC | PRN

## 2020-01-26 MED ORDER — VENLAFAXINE HCL ER 150 MG PO CP24: 150.0000 mg | ORAL_CAPSULE | Freq: Every day | ORAL | 1 refills | Status: DC

## 2020-01-26 MED ORDER — METFORMIN HCL 500 MG PO TABS: 1000.0000 mg | ORAL_TABLET | Freq: Every day | ORAL | 1 refills | Status: DC

## 2020-01-26 MED ORDER — ALPRAZOLAM 1 MG PO TABS: 1.0000 mg | ORAL_TABLET | Freq: Every evening | ORAL | 2 refills | Status: DC | PRN

## 2020-01-26 MED ORDER — SIMVASTATIN 10 MG PO TABS: 10.0000 mg | ORAL_TABLET | Freq: Every evening | ORAL | 1 refills | Status: DC

## 2020-01-26 NOTE — Progress Notes (Signed)
BP (!) 156/85 (BP Location: Left Arm, Cuff Size: Normal)   Pulse 84   Temp 98.7 F (37.1 C) (Oral)   SpO2 95%    Subjective:    Patient ID: Dawn Moore, female    DOB: 07/07/1941, 79 y.o.   MRN: [id number]  HPI: Dawn Moore is a 79 y.o. female  Chief Complaint  Patient presents with  . Diabetes  . Depression  . Anxiety   DIABETES Hypoglycemic episodes:no Polydipsia/polyuria: no Visual disturbance: no Chest pain: no Paresthesias: no Glucose Monitoring: no  Accucheck frequency: Not Checking Taking Insulin?: no Blood Pressure Monitoring: not checking Retinal Examination: Up to Date Foot Exam: Up to Date Diabetic Education: Completed Pneumovax: Up to Date Influenza: Up to Date Aspirin: no  ANXIETY/DEPRESSION Duration:uncontrolled Anxious mood: yes  Excessive worrying: yes Irritability: yes  Sweating: no Nausea: no Palpitations:yes Hyperventilation: yes Panic attacks: yes Agoraphobia: yes  Obscessions/compulsions: yes Depressed mood: yes Depression screen [facility] 2/9 01/26/2020 10/26/2019 08/25/2019 06/19/2019 05/18/2019  Decreased Interest 2 3 3 2 3   Down, Depressed, Hopeless 2 3 3 2 3   PHQ - 2 Score 4 6 6 4 6   Altered sleeping 3 3 3 1 3   Tired, decreased energy 3 3 3 2 3   Change in appetite 1 3 3  0 0  Feeling bad or failure about yourself  1 3 2 2 1   Trouble concentrating 3 3 3 1 2   Moving slowly or fidgety/restless 0 0 0 0 0  Suicidal thoughts 0 0 0 0 0  PHQ-9 Score 15 21 20 10 15   Difficult doing work/chores Extremely dIfficult Extremely dIfficult Very difficult Very difficult Very difficult  Some recent data might be hidden   GAD 7 : Generalized Anxiety Score 01/26/2020 10/26/2019 08/25/2019 06/19/2019  Nervous, Anxious, on Edge 3 3 2 1   Control/stop worrying 3 3 3 1   Worry too much - different things 3 3 3 1   Trouble relaxing 2 3 2 1   Restless 0 3 3 0  Easily annoyed or irritable 3 3 3 2   Afraid - awful might happen 2 3 0 1  Total GAD 7 Score 16 21  16 7   Anxiety Difficulty Extremely difficult Extremely difficult Not difficult at all Somewhat difficult   Anhedonia: yes Weight changes: no Insomnia: yes   Hypersomnia: yes Fatigue/loss of energy: yes Feelings of worthlessness: yes Feelings of guilt: yes Impaired concentration/indecisiveness: yes Suicidal ideations: no  Crying spells: yes Recent Stressors/Life Changes: yes   Relationship problems: yes   Family stress: yes     Financial stress: yes    Job stress: no    Recent death/loss: no  Relevant past medical, surgical, family and social history reviewed and updated as indicated. Interim medical history since our last visit reviewed. Allergies and medications reviewed and updated.  Review of Systems  Constitutional: Negative.   Respiratory: Negative.   Cardiovascular: Negative.   Musculoskeletal: Negative.   Skin: Negative.   Neurological: Negative.   Psychiatric/Behavioral: Positive for confusion, decreased concentration, dysphoric mood and sleep disturbance. Negative for agitation, behavioral problems, hallucinations, self-injury and suicidal ideas. The patient is nervous/anxious. The patient is not hyperactive.     Per HPI unless specifically indicated above     Objective:    BP (!) 156/85 (BP Location: Left Arm, Cuff Size: Normal)   Pulse 84   Temp 98.7 F (37.1 C) (Oral)   SpO2 95%   Wt Readings from Last 3 Encounters:  10/26/19 222 lb (100.7 kg)  08/25/19 224 lb (101.6 kg)  05/18/19 218 lb 8 oz (99.1 kg)    Physical Exam Vitals and nursing note reviewed.  Constitutional:      General: She is not in acute distress.    Appearance: Normal appearance. She is not ill-appearing, toxic-appearing or diaphoretic.  HENT:     Head: Normocephalic and atraumatic.     Right Ear: External ear normal.     Left Ear: External ear normal.     Nose: Nose normal.     Mouth/Throat:     Mouth: Mucous membranes are moist.     Pharynx: Oropharynx is clear.  Eyes:      General: No scleral icterus.       Right eye: No discharge.        Left eye: No discharge.     Extraocular Movements: Extraocular movements intact.     Conjunctiva/sclera: Conjunctivae normal.     Pupils: Pupils are equal, round, and reactive to light.  Cardiovascular:     Rate and Rhythm: Normal rate and regular rhythm.     Pulses: Normal pulses.     Heart sounds: Normal heart sounds. No murmur. No friction rub. No gallop.   Pulmonary:     Effort: Pulmonary effort is normal. No respiratory distress.     Breath sounds: Normal breath sounds. No stridor. No wheezing, rhonchi or rales.  Chest:     Chest wall: No tenderness.  Musculoskeletal:        General: Normal range of motion.     Cervical back: Normal range of motion and neck supple.  Skin:    General: Skin is warm and dry.     Capillary Refill: Capillary refill takes less than 2 seconds.     Coloration: Skin is not jaundiced or pale.     Findings: No bruising, erythema, lesion or rash.  Neurological:     General: No focal deficit present.     Mental Status: She is alert and oriented to person, place, and time. Mental status is at baseline.  Psychiatric:        Mood and Affect: Mood normal.        Behavior: Behavior normal.        Thought Content: Thought content normal.        Judgment: Judgment normal.     Results for orders placed or performed in visit on 01/26/20  Bayer DCA Hb A1c Waived  Result Value Ref Range   HB A1C (BAYER DCA - WAIVED) 6.2 <7.0 %      Assessment & Plan:   Problem List Items Addressed This Visit      Endocrine   Diabetes mellitus with renal manifestations, controlled ([place]) - Primary    Under good control with a1c of 6.2. Continue current regimen. Continue to monitor. Call with any concerns. Refills given.       Relevant Medications   metFORMIN (GLUCOPHAGE) 500 MG tablet   simvastatin (ZOCOR) 10 MG tablet   Other Relevant Orders   Bayer DCA Hb A1c Waived (Completed)     Other   Anxiety  disorder    Still not under good control. Has not been in touch with psychiatry due to a blocked phone. Number for psychiatry given today- encouraged her to call. Will continue current regimen. Refills given today. Lorazepam given today. Call with any concerns. Recheck 3 months.       Relevant Medications   ALPRAZolam (XANAX) 1 MG tablet   venlafaxine XR (EFFEXOR-XR) 150  MG 24 hr capsule   venlafaxine XR (EFFEXOR-XR) 75 MG 24 hr capsule   Other Relevant Orders   Ambulatory referral to Psychiatry   Depression    Still not under good control. Has not been in touch with psychiatry due to a blocked phone. Number for psychiatry given today- encouraged her to call. Will continue current regimen. Refills given today. Lorazepam given today. Call with any concerns. Recheck 3 months.       Relevant Medications   ALPRAZolam (XANAX) 1 MG tablet   venlafaxine XR (EFFEXOR-XR) 150 MG 24 hr capsule   venlafaxine XR (EFFEXOR-XR) 75 MG 24 hr capsule   Other Relevant Orders   Ambulatory referral to Psychiatry       Follow up plan: Return in about 3 months (around 04/25/2020).

## 2020-01-26 NOTE — Telephone Encounter (Signed)
Copied from [place] [phone]. Topic: General - Other >> Jan 26, 2020  2:29 PM Rainey Pines A wrote: Patient stated that she just left office and all medication were sent to the wrong pharmacy. Patient stated that all medication were to be sent to the mail in order pharmacy. Patients xanax was suppose to be sent to local pharmacy. Please advise

## 2020-01-26 NOTE — Patient Instructions (Addendum)
[facility].  Phone: [phone]

## 2020-01-26 NOTE — Telephone Encounter (Signed)
Rxs corrected- please cancel xanax at mail order

## 2020-01-27 ENCOUNTER — Telehealth: Payer: Self-pay

## 2020-01-27 NOTE — Telephone Encounter (Signed)
Opened in error

## 2020-01-30 NOTE — Assessment & Plan Note (Addendum)
Still not under good control. Has not been in touch with psychiatry due to a blocked phone. Number for psychiatry given today- encouraged her to call. Will continue current regimen. Refills given today. Lorazepam given today. Call with any concerns. Recheck 3 months.

## 2020-01-30 NOTE — Assessment & Plan Note (Signed)
Under good control with a1c of 6.2. Continue current regimen. Continue to monitor. Call with any concerns. Refills given.

## 2020-02-06 DIAGNOSIS — E119 Type 2 diabetes mellitus without complications: Secondary | ICD-10-CM | POA: Diagnosis not present

## 2020-02-06 DIAGNOSIS — Z6838 Body mass index (BMI) 38.0-38.9, adult: Secondary | ICD-10-CM | POA: Diagnosis not present

## 2020-02-06 DIAGNOSIS — G8929 Other chronic pain: Secondary | ICD-10-CM | POA: Diagnosis not present

## 2020-02-06 DIAGNOSIS — M1990 Unspecified osteoarthritis, unspecified site: Secondary | ICD-10-CM | POA: Diagnosis not present

## 2020-02-06 DIAGNOSIS — K219 Gastro-esophageal reflux disease without esophagitis: Secondary | ICD-10-CM | POA: Diagnosis not present

## 2020-02-06 DIAGNOSIS — R69 Illness, unspecified: Secondary | ICD-10-CM | POA: Diagnosis not present

## 2020-02-06 DIAGNOSIS — R030 Elevated blood-pressure reading, without diagnosis of hypertension: Secondary | ICD-10-CM | POA: Diagnosis not present

## 2020-02-06 DIAGNOSIS — E785 Hyperlipidemia, unspecified: Secondary | ICD-10-CM | POA: Diagnosis not present

## 2020-02-08 ENCOUNTER — Telehealth: Payer: Self-pay | Admitting: Family Medicine

## 2020-02-08 NOTE — Telephone Encounter (Signed)
Patient notified.  Patient was very upset on the phone. Patient states she was very dissatisfied with her last visit with Dr. Wynetta Emery. Patient states she felt like she did not get the exam she needed/wanted. States she asked Dr. Wynetta Emery to look at something in her scalp and in her ears and Dr. Wynetta Emery did neither. States that her BP was high on 2 checks that visit. States Dr. Wynetta Emery said she was going to check it again at the visit and did not.   Letting Dr. Wynetta Emery and office manager know this information.

## 2020-02-08 NOTE — Telephone Encounter (Signed)
She should use her voltaren gel  Copied from [place] [phone]. Topic: General - Inquiry >> Feb 04, 2020  9:42 AM Reyne Dumas L wrote: Reason for CRM:   Pt states at [place] last week she told PCP that hip and back were in pain, but she doesn't remember what PCP told her to do about it.

## 2020-02-08 NOTE — Telephone Encounter (Signed)
Noted  

## 2020-02-17 ENCOUNTER — Other Ambulatory Visit: Payer: Self-pay | Admitting: Family Medicine

## 2020-03-01 DIAGNOSIS — Z7289 Other problems related to lifestyle: Secondary | ICD-10-CM | POA: Diagnosis not present

## 2020-03-01 DIAGNOSIS — M25569 Pain in unspecified knee: Secondary | ICD-10-CM | POA: Diagnosis not present

## 2020-03-01 DIAGNOSIS — E119 Type 2 diabetes mellitus without complications: Secondary | ICD-10-CM | POA: Diagnosis not present

## 2020-03-01 DIAGNOSIS — H6983 Other specified disorders of Eustachian tube, bilateral: Secondary | ICD-10-CM | POA: Diagnosis not present

## 2020-03-01 DIAGNOSIS — R682 Dry mouth, unspecified: Secondary | ICD-10-CM | POA: Diagnosis not present

## 2020-03-01 DIAGNOSIS — H6993 Unspecified Eustachian tube disorder, bilateral: Secondary | ICD-10-CM | POA: Diagnosis not present

## 2020-03-01 DIAGNOSIS — R69 Illness, unspecified: Secondary | ICD-10-CM | POA: Diagnosis not present

## 2020-03-01 DIAGNOSIS — M549 Dorsalgia, unspecified: Secondary | ICD-10-CM | POA: Diagnosis not present

## 2020-03-01 DIAGNOSIS — H9203 Otalgia, bilateral: Secondary | ICD-10-CM | POA: Diagnosis not present

## 2020-03-01 DIAGNOSIS — R05 Cough: Secondary | ICD-10-CM | POA: Diagnosis not present

## 2020-03-01 DIAGNOSIS — I10 Essential (primary) hypertension: Secondary | ICD-10-CM | POA: Diagnosis not present

## 2020-04-02 DIAGNOSIS — L853 Xerosis cutis: Secondary | ICD-10-CM | POA: Diagnosis not present

## 2020-04-02 DIAGNOSIS — L237 Allergic contact dermatitis due to plants, except food: Secondary | ICD-10-CM | POA: Diagnosis not present

## 2020-04-04 DIAGNOSIS — E119 Type 2 diabetes mellitus without complications: Secondary | ICD-10-CM | POA: Diagnosis not present

## 2020-04-04 DIAGNOSIS — I10 Essential (primary) hypertension: Secondary | ICD-10-CM | POA: Diagnosis not present

## 2020-04-04 DIAGNOSIS — R69 Illness, unspecified: Secondary | ICD-10-CM | POA: Diagnosis not present

## 2020-04-04 DIAGNOSIS — L237 Allergic contact dermatitis due to plants, except food: Secondary | ICD-10-CM | POA: Diagnosis not present

## 2020-04-26 ENCOUNTER — Ambulatory Visit: Payer: Medicare HMO | Admitting: Family Medicine

## 2020-05-04 DIAGNOSIS — R69 Illness, unspecified: Secondary | ICD-10-CM | POA: Diagnosis not present

## 2020-05-04 DIAGNOSIS — I10 Essential (primary) hypertension: Secondary | ICD-10-CM | POA: Diagnosis not present

## 2020-05-04 DIAGNOSIS — E119 Type 2 diabetes mellitus without complications: Secondary | ICD-10-CM | POA: Diagnosis not present

## 2020-05-16 DIAGNOSIS — R42 Dizziness and giddiness: Secondary | ICD-10-CM | POA: Diagnosis not present

## 2020-05-17 DIAGNOSIS — R69 Illness, unspecified: Secondary | ICD-10-CM | POA: Diagnosis not present

## 2020-05-17 DIAGNOSIS — R42 Dizziness and giddiness: Secondary | ICD-10-CM | POA: Diagnosis not present

## 2020-05-17 DIAGNOSIS — D696 Thrombocytopenia, unspecified: Secondary | ICD-10-CM | POA: Diagnosis not present

## 2020-05-17 DIAGNOSIS — I10 Essential (primary) hypertension: Secondary | ICD-10-CM | POA: Diagnosis not present

## 2020-05-24 DIAGNOSIS — E119 Type 2 diabetes mellitus without complications: Secondary | ICD-10-CM | POA: Diagnosis not present

## 2020-05-24 DIAGNOSIS — R69 Illness, unspecified: Secondary | ICD-10-CM | POA: Diagnosis not present

## 2020-05-24 DIAGNOSIS — R42 Dizziness and giddiness: Secondary | ICD-10-CM | POA: Diagnosis not present

## 2020-06-08 DIAGNOSIS — I10 Essential (primary) hypertension: Secondary | ICD-10-CM | POA: Diagnosis not present

## 2020-06-08 DIAGNOSIS — R69 Illness, unspecified: Secondary | ICD-10-CM | POA: Diagnosis not present

## 2020-06-08 DIAGNOSIS — R42 Dizziness and giddiness: Secondary | ICD-10-CM | POA: Diagnosis not present

## 2020-08-18 ENCOUNTER — Telehealth: Payer: Self-pay | Admitting: Family Medicine

## 2020-08-18 NOTE — Telephone Encounter (Signed)
Patient declined AWVS with [facility] due to not being a patient any longer.  Stated she does to [facility] now.

## 2020-08-24 DIAGNOSIS — T679XXA Effect of heat and light, unspecified, initial encounter: Secondary | ICD-10-CM | POA: Diagnosis not present

## 2020-08-24 DIAGNOSIS — R112 Nausea with vomiting, unspecified: Secondary | ICD-10-CM | POA: Diagnosis not present

## 2020-09-14 DIAGNOSIS — M545 Low back pain: Secondary | ICD-10-CM | POA: Diagnosis not present

## 2020-09-14 DIAGNOSIS — M1711 Unilateral primary osteoarthritis, right knee: Secondary | ICD-10-CM | POA: Diagnosis not present

## 2020-09-14 DIAGNOSIS — Z6833 Body mass index (BMI) 33.0-33.9, adult: Secondary | ICD-10-CM | POA: Diagnosis not present

## 2020-09-14 DIAGNOSIS — R69 Illness, unspecified: Secondary | ICD-10-CM | POA: Diagnosis not present

## 2020-09-14 DIAGNOSIS — M8589 Other specified disorders of bone density and structure, multiple sites: Secondary | ICD-10-CM | POA: Diagnosis not present

## 2020-09-14 DIAGNOSIS — E6609 Other obesity due to excess calories: Secondary | ICD-10-CM | POA: Diagnosis not present

## 2020-09-14 DIAGNOSIS — E119 Type 2 diabetes mellitus without complications: Secondary | ICD-10-CM | POA: Diagnosis not present

## 2020-09-14 DIAGNOSIS — M170 Bilateral primary osteoarthritis of knee: Secondary | ICD-10-CM | POA: Diagnosis not present

## 2020-09-14 DIAGNOSIS — M25561 Pain in right knee: Secondary | ICD-10-CM | POA: Diagnosis not present

## 2020-09-14 DIAGNOSIS — M25562 Pain in left knee: Secondary | ICD-10-CM | POA: Diagnosis not present

## 2020-09-14 DIAGNOSIS — Z23 Encounter for immunization: Secondary | ICD-10-CM | POA: Diagnosis not present

## 2020-09-14 DIAGNOSIS — M5137 Other intervertebral disc degeneration, lumbosacral region: Secondary | ICD-10-CM | POA: Diagnosis not present

## 2020-09-14 DIAGNOSIS — I700 Atherosclerosis of aorta: Secondary | ICD-10-CM | POA: Diagnosis not present

## 2020-10-04 DIAGNOSIS — R69 Illness, unspecified: Secondary | ICD-10-CM | POA: Diagnosis not present

## 2020-10-10 DIAGNOSIS — E119 Type 2 diabetes mellitus without complications: Secondary | ICD-10-CM | POA: Diagnosis not present

## 2020-10-10 DIAGNOSIS — M545 Low back pain, unspecified: Secondary | ICD-10-CM | POA: Diagnosis not present

## 2020-11-07 ENCOUNTER — Other Ambulatory Visit: Payer: Self-pay | Admitting: Family Medicine

## 2020-11-18 DIAGNOSIS — R69 Illness, unspecified: Secondary | ICD-10-CM | POA: Diagnosis not present

## 2020-11-18 DIAGNOSIS — E119 Type 2 diabetes mellitus without complications: Secondary | ICD-10-CM | POA: Diagnosis not present

## 2020-11-18 DIAGNOSIS — E1142 Type 2 diabetes mellitus with diabetic polyneuropathy: Secondary | ICD-10-CM | POA: Diagnosis not present

## 2020-11-21 DIAGNOSIS — E119 Type 2 diabetes mellitus without complications: Secondary | ICD-10-CM | POA: Diagnosis not present

## 2020-12-16 DIAGNOSIS — E1142 Type 2 diabetes mellitus with diabetic polyneuropathy: Secondary | ICD-10-CM | POA: Diagnosis not present

## 2020-12-16 DIAGNOSIS — R69 Illness, unspecified: Secondary | ICD-10-CM | POA: Diagnosis not present

## 2020-12-16 DIAGNOSIS — E119 Type 2 diabetes mellitus without complications: Secondary | ICD-10-CM | POA: Diagnosis not present

## 2022-11-27 ENCOUNTER — Other Ambulatory Visit: Payer: Self-pay | Admitting: Orthopedic Surgery

## 2022-11-27 ENCOUNTER — Encounter: Payer: Self-pay | Admitting: Orthopedic Surgery

## 2022-11-27 DIAGNOSIS — Z01818 Encounter for other preprocedural examination: Secondary | ICD-10-CM

## 2022-11-27 NOTE — H&P (Deleted)
  The note originally documented on this encounter has been moved the the encounter in which it belongs.  

## 2022-11-27 NOTE — H&P (Signed)
SULA FETTERLY MRN:  [id number] DOB/SEX:  12/15/1941/female  CHIEF COMPLAINT:  Painful right Knee  HISTORY: Patient is a 81 y.o. female presented with a history of pain in the right knee. Onset of symptoms was gradual starting several years ago with gradually worsening course since that time. Prior procedures on the knee include none. Patient has been treated conservatively with over-the-counter NSAIDs and activity modification. Patient currently rates pain in the knee at 10 out of 10 with activity. There is pain at night.  PAST MEDICAL HISTORY: Patient Active Problem List   Diagnosis Date Noted   Peripheral vascular disease ([place]) 10/26/2019   History of recurrent UTIs 06/01/2019   Memory loss 12/17/2017   Ankle fracture, bimalleolar, closed 07/23/2017   Advanced care planning/counseling discussion 04/18/2017   HTN (hypertension) 12/14/2016   Transient alteration of awareness 05/01/2016   Controlled substance agreement signed 11/29/2015   Thrombocytopenia ([place]) 07/21/2015   Depression 07/21/2015   Osteopenia 07/21/2015   Diabetes mellitus with renal manifestations, controlled ([place]) 07/21/2015   Generalized osteoarthritis 07/21/2015   Hyperlipidemia 07/21/2015   Asthma 07/21/2015   Diabetic neuropathy ([place]) 07/21/2015   GERD (gastroesophageal reflux disease) 07/21/2015   Nonspecific elevation of levels of transaminase or lactic acid dehydrogenase (LDH) 07/21/2015   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, including hip 07/21/2015   Anxiety disorder 07/07/2015   Candidal intertrigo 07/07/2015   Past Medical History:  Diagnosis Date   Anxiety    Arthritis    OA knees, hands and feet   Depression    Diabetes mellitus without complication (HCC)    GERD (gastroesophageal reflux disease)    Headache    Osteopenia    Shortness of breath dyspnea    Sleep apnea    doesn't use CPAP   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   Past Surgical History:  Procedure  Laterality Date   ABDOMINAL HYSTERECTOMY  1986   still has one ovary   CATARACT EXTRACTION W/PHACO Left 03/07/2016   Procedure: CATARACT EXTRACTION PHACO AND INTRAOCULAR LENS PLACEMENT ([place]);  Surgeon: Leandrew Koyanagi, MD;  Location: [facility];  Service: Ophthalmology;  Laterality: Left;  DIABETIC -oral meds Sleep apnea   CATARACT EXTRACTION W/PHACO Right 03/28/2016   Procedure: CATARACT EXTRACTION PHACO AND INTRAOCULAR LENS PLACEMENT ([facility]);  Surgeon: Leandrew Koyanagi, MD;  Location: [facility];  Service: Ophthalmology;  Laterality: Right;  DIABETIC - oral meds   CHOLECYSTECTOMY  1999   COCHLEAR IMPLANT     FOOT FRACTURE SURGERY     HEEL SPUR SURGERY Left 11/04/09   Revision 01/2011   INTRAOCULAR LENS INSERTION Bilateral    KYPHOPLASTY N/A 10/17/2017   Procedure: LUMBAR 3 KYPHOPLASTY; REQUEST 1 HOUR;  Surgeon: Phylliss Bob, MD;  Location: [facility];  Service: Orthopedics;  Laterality: N/A;  LUMBAR 3 KYPHOPLASTY; REQUEST 1 HOUR     MEDICATIONS:  (Not in a hospital admission)   ALLERGIES:   Allergies  Allergen Reactions   Nsaids Other (See Comments)    Per Hematology   Wellbutrin [Bupropion] Other (See Comments)    Thoughts that she would die    REVIEW OF SYSTEMS:  Pertinent items are noted in HPI.   FAMILY HISTORY:   Family History  Problem Relation Age of Onset   Stroke Mother    Dementia Mother    Cancer Father        prostate   Skin cancer Daughter    Hyperlipidemia Son    Hypertension Son  Mental illness Son    Alcohol abuse Son    Arthritis Sister        rheumatoid   Cancer Sister        breast   Thyroid disease Sister    Cancer Sister        thyroid   Cancer Brother        prostate   Arthritis Maternal Grandmother     SOCIAL HISTORY:   Social History   Tobacco Use   Smoking status: Never   Smokeless tobacco: Never  Substance Use Topics   Alcohol use: No    Alcohol/week: 0.0 standard drinks of alcohol      EXAMINATION:  Vital signs in last 24 hours: '@VSRANGES'$ @  General appearance: alert, cooperative, and no distress Neck: no JVD and supple, symmetrical, trachea midline Lungs: clear to auscultation bilaterally Heart: regular rate and rhythm, S1, S2 normal, no murmur, click, rub or gallop Abdomen: soft, non-tender; bowel sounds normal; no masses,  no organomegaly Extremities: extremities normal, atraumatic, no cyanosis or edema and Homans sign is negative, no sign of DVT Pulses: 2+ and symmetric Skin: Skin color, texture, turgor normal. No rashes or lesions  Musculoskeletal:  ROM 0-100, Ligaments intact,  Imaging Review Plain radiographs demonstrate severe degenerative joint disease of the right knee. The overall alignment is significant varus. The bone quality appears to be good for age and reported activity level.  Assessment/Plan: Primary osteoarthritis, right knee   The patient history, physical examination and imaging studies are consistent with advanced degenerative joint disease of the right knee. The patient has failed conservative treatment.  The clearance notes were reviewed.  After discussion with the patient it was felt that Total Knee Replacement was indicated. The procedure,  risks, and benefits of total knee arthroplasty were presented and reviewed. The risks including but not limited to aseptic loosening, infection, blood clots, vascular injury, stiffness, patella tracking problems complications among others were discussed. The patient acknowledged the explanation, agreed to proceed with the plan.  Carlynn Spry 11/27/2022, 10:23 AM

## 2022-11-29 ENCOUNTER — Encounter
Admission: RE | Admit: 2022-11-29 | Discharge: 2022-11-29 | Disposition: A | Discharge status: Home / Self Care | Payer: Medicare HMO | Source: Ambulatory Visit | Attending: Orthopedic Surgery | Admitting: Orthopedic Surgery

## 2022-11-29 VITALS — Ht 65.0 in | Wt 184.0 lb

## 2022-11-29 DIAGNOSIS — Z01818 Encounter for other preprocedural examination: Secondary | ICD-10-CM

## 2022-11-29 DIAGNOSIS — I10 Essential (primary) hypertension: Secondary | ICD-10-CM

## 2022-11-29 DIAGNOSIS — Z01810 Encounter for preprocedural cardiovascular examination: Secondary | ICD-10-CM

## 2022-11-29 DIAGNOSIS — E1122 Type 2 diabetes mellitus with diabetic chronic kidney disease: Secondary | ICD-10-CM

## 2022-11-29 HISTORY — DX: Essential (primary) hypertension: I10

## 2022-11-29 HISTORY — DX: Unspecified dementia, unspecified severity, without behavioral disturbance, psychotic disturbance, mood disturbance, and anxiety: F03.90

## 2022-11-29 HISTORY — DX: Peripheral vascular disease, unspecified: I73.9

## 2022-11-29 HISTORY — DX: Malignant (primary) neoplasm, unspecified: C80.1

## 2022-11-29 HISTORY — DX: Type 2 diabetes mellitus without complications: E11.9

## 2022-11-29 HISTORY — DX: Urinary tract infection, site not specified: N39.0

## 2022-11-29 HISTORY — DX: Unspecified asthma, uncomplicated: J45.909

## 2022-11-29 NOTE — Patient Instructions (Addendum)
Your procedure is scheduled on:12-11-22 Tuesday Report to the Registration Desk on the 1st floor of the [facility].Then proceed to the 2nd floor Surgery Desk To find out your arrival time, please call [phone] between 1PM - 3PM on:12-10-22 Monday If your arrival time is 6:00 am, do not arrive prior to that time as the [facility] entrance doors do not open until 6:00 am.  REMEMBER: Instructions that are not followed completely may result in serious medical risk, up to and including death; or upon the discretion of your surgeon and anesthesiologist your surgery may need to be rescheduled.  Do not eat food OR drink any liquids after midnight the night before surgery.  No gum chewing, lozengers or hard candies.  TAKE THESE MEDICATIONS THE MORNING OF SURGERY WITH A SIP OF WATER: -atorvastatin (LIPITOR)  -DULoxetine (CYMBALTA)  -memantine (NAMENDA)  -omeprazole (PRILOSEC)   Take Lisinopril up until the day prior to surgery-Do NOT take the morning of surgery  One week prior to surgery: Stop Anti-inflammatories (NSAIDS) such as Advil, Aleve, Ibuprofen, Motrin, Naproxen, Naprosyn and Aspirin based products such as Excedrin, Goodys Powder, BC Powder.You may however, take Tylenol if needed for pain up until the day of surgery.  Stop ANY OVER THE COUNTER supplements/vitamins 7 days prior to surgery (Vitamin C)-You can continue your Azo Cranberry up until the day prior to surgery  No Alcohol for 24 hours before or after surgery.  No Smoking including e-cigarettes for 24 hours prior to surgery.  No chewable tobacco products for at least 6 hours prior to surgery.  No nicotine patches on the day of surgery.  Do not use any "recreational" drugs for at least a week prior to your surgery.  Please be advised that the combination of cocaine and anesthesia may have negative outcomes, up to and including death. If you test positive for cocaine, your surgery will be cancelled.  On the morning of  surgery brush your teeth with toothpaste and water, you may rinse your mouth with mouthwash if you wish. Do not swallow any toothpaste or mouthwash.  Use CHG Soap as directed on instruction sheet.  Do not wear jewelry, make-up, hairpins, clips or nail polish.  Do not wear lotions, powders, or perfumes.   Do not shave body from the neck down 48 hours prior to surgery just in case you cut yourself which could leave a site for infection.  Also, freshly shaved skin may become irritated if using the CHG soap.  Contact lenses, hearing aids and dentures may not be worn into surgery.  Do not bring valuables to the hospital. [facility] is not responsible for any missing/lost belongings or valuables.   Notify your doctor if there is any change in your medical condition (cold, fever, infection).  Wear comfortable clothing (specific to your surgery type) to the hospital.  After surgery, you can help prevent lung complications by doing breathing exercises.  Take deep breaths and cough every 1-2 hours. Your doctor may order a device called an Incentive Spirometer to help you take deep breaths. When coughing or sneezing, hold a pillow firmly against your incision with both hands. This is called "splinting." Doing this helps protect your incision. It also decreases belly discomfort.  If you are being admitted to the hospital overnight, leave your suitcase in the car. After surgery it may be brought to your room.  If you are being discharged the day of surgery, you will not be allowed to drive home. You will need a  responsible adult (18 years or older) to drive you home and stay with you that night.   If you are taking public transportation, you will need to have a responsible adult (18 years or older) with you. Please confirm with your physician that it is acceptable to use public transportation.   Please call the [facility]. at [phone] if you have any questions about these  instructions.  Surgery Visitation Policy:  Patients undergoing a surgery or procedure may have two family members or support persons with them as long as the person is not COVID-19 positive or experiencing its symptoms.   Inpatient Visitation:    Visiting hours are 7 a.m. to 8 p.m. Up to four visitors are allowed at one time in a patient room. The visitors may rotate out with other people during the day. One designated support person (adult) may remain overnight.  MASKING: Due to an increase in RSV rates and hospitalizations, in-patient care areas in which we serve newborns, infants and children, masks will be required for teammates and visitors.  Children ages 61 and under may not visit. This policy affects the following departments only:  [facility] Postpartum area Mother Baby Unit Newborn nursery/Special care nursery  Other areas: Masks continue to be strongly recommended for [facility] teammates, visitors and patients in all other areas. Visitation is not restricted outside of the units listed above.   How to Use an Incentive Spirometer An incentive spirometer is a tool that measures how well you are filling your lungs with each breath. Learning to take long, deep breaths using this tool can help you keep your lungs clear and active. This may help to reverse or lessen your chance of developing breathing (pulmonary) problems, especially infection. You may be asked to use a spirometer: After a surgery. If you have a lung problem or a history of smoking. After a long period of time when you have been unable to move or be active. If the spirometer includes an indicator to show the highest number that you have reached, your health care provider or respiratory therapist will help you set a goal. Keep a log of your progress as told by your health care provider. What are the risks? Breathing too quickly may cause dizziness or cause you to pass out. Take your time  so you do not get dizzy or light-headed. If you are in pain, you may need to take pain medicine before doing incentive spirometry. It is harder to take a deep breath if you are having pain. How to use your incentive spirometer  Sit up on the edge of your bed or on a chair. Hold the incentive spirometer so that it is in an upright position. Before you use the spirometer, breathe out normally. Place the mouthpiece in your mouth. Make sure your lips are closed tightly around it. Breathe in slowly and as deeply as you can through your mouth, causing the piston or the ball to rise toward the top of the chamber. Hold your breath for 3-5 seconds, or for as long as possible. If the spirometer includes a coach indicator, use this to guide you in breathing. Slow down your breathing if the indicator goes above the marked areas. Remove the mouthpiece from your mouth and breathe out normally. The piston or ball will return to the bottom of the chamber. Rest for a few seconds, then repeat the steps 10 or more times. Take your time and take a few normal breaths  between deep breaths so that you do not get dizzy or light-headed. Do this every 1-2 hours when you are awake. If the spirometer includes a goal marker to show the highest number you have reached (best effort), use this as a goal to work toward during each repetition. After each set of 10 deep breaths, cough a few times. This will help to make sure that your lungs are clear. If you have an incision on your chest or abdomen from surgery, place a pillow or a rolled-up towel firmly against the incision when you cough. This can help to reduce pain while taking deep breaths and coughing. General tips When you are able to get out of bed: Walk around often. Continue to take deep breaths and cough in order to clear your lungs. Keep using the incentive spirometer until your health care provider says it is okay to stop using it. If you have been in the hospital,  you may be told to keep using the spirometer at home. Contact a health care provider if: You are having difficulty using the spirometer. You have trouble using the spirometer as often as instructed. Your pain medicine is not giving enough relief for you to use the spirometer as told. You have a fever. Get help right away if: You develop shortness of breath. You develop a cough with bloody mucus from the lungs. You have fluid or blood coming from an incision site after you cough. Summary An incentive spirometer is a tool that can help you learn to take long, deep breaths to keep your lungs clear and active. You may be asked to use a spirometer after a surgery, if you have a lung problem or a history of smoking, or if you have been inactive for a long period of time. Use your incentive spirometer as instructed every 1-2 hours while you are awake. If you have an incision on your chest or abdomen, place a pillow or a rolled-up towel firmly against your incision when you cough. This will help to reduce pain. Get help right away if you have shortness of breath, you cough up bloody mucus, or blood comes from your incision when you cough. This information is not intended to replace advice given to you by your health care provider. Make sure you discuss any questions you have with your health care provider. Document Revised: 03/07/2020 Document Reviewed: 03/07/2020 Elsevier Patient Education  [employer].

## 2022-12-03 ENCOUNTER — Encounter
Admission: RE | Admit: 2022-12-03 | Discharge: 2022-12-03 | Disposition: A | Discharge status: Home / Self Care | Payer: Medicare HMO | Source: Ambulatory Visit | Attending: Orthopedic Surgery | Admitting: Orthopedic Surgery

## 2022-12-03 DIAGNOSIS — R8290 Unspecified abnormal findings in urine: Secondary | ICD-10-CM | POA: Diagnosis not present

## 2022-12-03 DIAGNOSIS — Z01810 Encounter for preprocedural cardiovascular examination: Secondary | ICD-10-CM

## 2022-12-03 DIAGNOSIS — Z01818 Encounter for other preprocedural examination: Secondary | ICD-10-CM | POA: Insufficient documentation

## 2022-12-03 DIAGNOSIS — Z96651 Presence of right artificial knee joint: Secondary | ICD-10-CM

## 2022-12-03 DIAGNOSIS — E1122 Type 2 diabetes mellitus with diabetic chronic kidney disease: Secondary | ICD-10-CM | POA: Insufficient documentation

## 2022-12-03 DIAGNOSIS — R9431 Abnormal electrocardiogram [ECG] [EKG]: Secondary | ICD-10-CM | POA: Diagnosis not present

## 2022-12-03 DIAGNOSIS — Z87440 Personal history of urinary (tract) infections: Secondary | ICD-10-CM | POA: Diagnosis not present

## 2022-12-03 DIAGNOSIS — I10 Essential (primary) hypertension: Secondary | ICD-10-CM | POA: Diagnosis not present

## 2022-12-03 DIAGNOSIS — Z01812 Encounter for preprocedural laboratory examination: Secondary | ICD-10-CM

## 2022-12-03 LAB — BASIC METABOLIC PANEL
Anion gap: 6 (ref 5–15)
BUN: 10 mg/dL (ref 8–23)
CO2: 30 mmol/L (ref 22–32)
Calcium: 9.4 mg/dL (ref 8.9–10.3)
Chloride: 104 mmol/L (ref 98–111)
Creatinine, Ser: 1.1 mg/dL — ABNORMAL HIGH (ref 0.44–1.00)
GFR, Estimated: 51 mL/min — ABNORMAL LOW (ref >60)
Glucose, Bld: 123 mg/dL — ABNORMAL HIGH (ref 70–99)
Potassium: 4.1 mmol/L (ref 3.5–5.1)
Sodium: 140 mmol/L (ref 135–145)

## 2022-12-03 LAB — CBC
HCT: 40.2 % (ref 36.0–46.0)
Hemoglobin: 12.6 g/dL (ref 12.0–15.0)
MCH: 26.8 pg (ref 26.0–34.0)
MCHC: 31.3 g/dL (ref 30.0–36.0)
MCV: 85.4 fL (ref 80.0–100.0)
Platelets: 137 10*3/uL — ABNORMAL LOW (ref 150–400)
RBC: 4.71 MIL/uL (ref 3.87–5.11)
RDW: 14.6 % (ref 11.5–15.5)
WBC: 4 10*3/uL (ref 4.0–10.5)
nRBC: 0 % (ref 0.0–0.2)

## 2022-12-03 LAB — TYPE AND SCREEN
ABO/RH(D): AB POS
Antibody Screen: NEGATIVE

## 2022-12-03 LAB — SURGICAL PCR SCREEN
MRSA, PCR: NEGATIVE
Staphylococcus aureus: NEGATIVE

## 2022-12-04 DIAGNOSIS — Z01818 Encounter for other preprocedural examination: Secondary | ICD-10-CM | POA: Diagnosis not present

## 2022-12-04 LAB — URINALYSIS, ROUTINE W REFLEX MICROSCOPIC
Bilirubin Urine: NEGATIVE
Glucose, UA: NEGATIVE mg/dL
Hgb urine dipstick: NEGATIVE
Ketones, ur: NEGATIVE mg/dL
Nitrite: NEGATIVE
Protein, ur: NEGATIVE mg/dL
Specific Gravity, Urine: 1.018 (ref 1.005–1.030)
pH: 5 (ref 5.0–8.0)

## 2022-12-06 LAB — URINE CULTURE: Culture: NO GROWTH

## 2022-12-11 ENCOUNTER — Ambulatory Visit: Payer: Medicare HMO

## 2022-12-11 ENCOUNTER — Ambulatory Visit: Payer: Medicare HMO | Admitting: Urgent Care

## 2022-12-11 ENCOUNTER — Other Ambulatory Visit: Payer: Self-pay

## 2022-12-11 ENCOUNTER — Encounter: Payer: Self-pay | Admitting: Orthopedic Surgery

## 2022-12-11 ENCOUNTER — Observation Stay
Admission: RE | Admit: 2022-12-11 | Discharge: 2022-12-12 | Disposition: A | Discharge status: Home Health | Payer: Medicare HMO | Attending: Orthopedic Surgery | Admitting: Orthopedic Surgery

## 2022-12-11 ENCOUNTER — Encounter
Admission: RE | Disposition: A | Discharge status: Home Health | Payer: Self-pay | Source: Home / Self Care | Attending: Orthopedic Surgery

## 2022-12-11 DIAGNOSIS — M1711 Unilateral primary osteoarthritis, right knee: Principal | ICD-10-CM | POA: Insufficient documentation

## 2022-12-11 DIAGNOSIS — E119 Type 2 diabetes mellitus without complications: Secondary | ICD-10-CM | POA: Diagnosis not present

## 2022-12-11 DIAGNOSIS — Z01812 Encounter for preprocedural laboratory examination: Secondary | ICD-10-CM

## 2022-12-11 DIAGNOSIS — R8290 Unspecified abnormal findings in urine: Secondary | ICD-10-CM

## 2022-12-11 DIAGNOSIS — Z8673 Personal history of transient ischemic attack (TIA), and cerebral infarction without residual deficits: Secondary | ICD-10-CM | POA: Diagnosis not present

## 2022-12-11 DIAGNOSIS — Z85828 Personal history of other malignant neoplasm of skin: Secondary | ICD-10-CM | POA: Insufficient documentation

## 2022-12-11 DIAGNOSIS — Z01818 Encounter for other preprocedural examination: Secondary | ICD-10-CM

## 2022-12-11 DIAGNOSIS — I10 Essential (primary) hypertension: Secondary | ICD-10-CM | POA: Insufficient documentation

## 2022-12-11 DIAGNOSIS — F0390 Unspecified dementia without behavioral disturbance: Secondary | ICD-10-CM | POA: Insufficient documentation

## 2022-12-11 DIAGNOSIS — J45909 Unspecified asthma, uncomplicated: Secondary | ICD-10-CM | POA: Diagnosis not present

## 2022-12-11 DIAGNOSIS — Z96651 Presence of right artificial knee joint: Secondary | ICD-10-CM

## 2022-12-11 HISTORY — PX: TOTAL KNEE ARTHROPLASTY: SHX125

## 2022-12-11 LAB — GLUCOSE, CAPILLARY
Glucose-Capillary: 124 mg/dL — ABNORMAL HIGH (ref 70–99)
Glucose-Capillary: 134 mg/dL — ABNORMAL HIGH (ref 70–99)
Glucose-Capillary: 159 mg/dL — ABNORMAL HIGH (ref 70–99)
Glucose-Capillary: 234 mg/dL — ABNORMAL HIGH (ref 70–99)

## 2022-12-11 LAB — ABO/RH: ABO/RH(D): AB POS

## 2022-12-11 SURGERY — ARTHROPLASTY, KNEE, TOTAL
Anesthesia: Regional | Site: Knee | Laterality: Right

## 2022-12-11 MED ORDER — MAGNESIUM CITRATE PO SOLN: 1.0000 | Freq: Once | ORAL | Status: DC | PRN

## 2022-12-11 MED ORDER — LIDOCAINE HCL (CARDIAC) PF 100 MG/5ML IV SOSY
PREFILLED_SYRINGE | INTRAVENOUS | Status: DC | PRN
  Administered 2022-12-11: 80 mg via INTRAVENOUS

## 2022-12-11 MED ORDER — CHLORHEXIDINE GLUCONATE 0.12 % MT SOLN
OROMUCOSAL | Status: AC
  Administered 2022-12-11: 15 mL via OROMUCOSAL
  Filled 2022-12-11: qty 15

## 2022-12-11 MED ORDER — KETAMINE HCL 10 MG/ML IJ SOLN
INTRAMUSCULAR | Status: DC | PRN
  Administered 2022-12-11: 30 mg via INTRAVENOUS

## 2022-12-11 MED ORDER — BUPIVACAINE LIPOSOME 1.3 % IJ SUSP
INTRAMUSCULAR | Status: AC
  Filled 2022-12-11: qty 20

## 2022-12-11 MED ORDER — ALUM & MAG HYDROXIDE-SIMETH 200-200-20 MG/5ML PO SUSP: 30.0000 mL | ORAL | Status: DC | PRN

## 2022-12-11 MED ORDER — ONDANSETRON HCL 4 MG/2ML IJ SOLN
INTRAMUSCULAR | Status: DC | PRN
  Administered 2022-12-11: 4 mg via INTRAVENOUS

## 2022-12-11 MED ORDER — ATORVASTATIN CALCIUM 20 MG PO TABS
80.0000 mg | ORAL_TABLET | Freq: Every day | ORAL | Status: DC
  Administered 2022-12-12: 80 mg via ORAL
  Filled 2022-12-11: qty 4

## 2022-12-11 MED ORDER — FENTANYL CITRATE (PF) 100 MCG/2ML IJ SOLN
25.0000 ug | INTRAMUSCULAR | Status: DC | PRN
  Administered 2022-12-11: 25 ug via INTRAVENOUS

## 2022-12-11 MED ORDER — FENTANYL CITRATE PF 50 MCG/ML IJ SOSY: 50.0000 ug | PREFILLED_SYRINGE | Freq: Once | INTRAMUSCULAR | Status: AC

## 2022-12-11 MED ORDER — DIPHENHYDRAMINE HCL 12.5 MG/5ML PO ELIX: 12.5000 mg | ORAL_SOLUTION | ORAL | Status: DC | PRN

## 2022-12-11 MED ORDER — TRANEXAMIC ACID-NACL 1000-0.7 MG/100ML-% IV SOLN
INTRAVENOUS | Status: AC
  Filled 2022-12-11: qty 100

## 2022-12-11 MED ORDER — ACETAMINOPHEN 500 MG PO TABS
500.0000 mg | ORAL_TABLET | Freq: Four times a day (QID) | ORAL | Status: AC
  Administered 2022-12-11 – 2022-12-12 (×4): 500 mg via ORAL
  Filled 2022-12-11 (×4): qty 1

## 2022-12-11 MED ORDER — FENTANYL CITRATE (PF) 100 MCG/2ML IJ SOLN
INTRAMUSCULAR | Status: DC | PRN
  Administered 2022-12-11 (×2): 50 ug via INTRAVENOUS

## 2022-12-11 MED ORDER — CEFAZOLIN SODIUM-DEXTROSE 2-4 GM/100ML-% IV SOLN
INTRAVENOUS | Status: AC
  Administered 2022-12-11: 2 g via INTRAVENOUS
  Filled 2022-12-11: qty 100

## 2022-12-11 MED ORDER — OXYCODONE HCL 5 MG/5ML PO SOLN: 5.0000 mg | Freq: Once | ORAL | Status: DC | PRN

## 2022-12-11 MED ORDER — PHENOL 1.4 % MT LIQD: 1.0000 | OROMUCOSAL | Status: DC | PRN

## 2022-12-11 MED ORDER — DOCUSATE SODIUM 100 MG PO CAPS
100.0000 mg | ORAL_CAPSULE | Freq: Two times a day (BID) | ORAL | Status: DC
  Administered 2022-12-11 – 2022-12-12 (×3): 100 mg via ORAL
  Filled 2022-12-11 (×3): qty 1

## 2022-12-11 MED ORDER — BUPIVACAINE LIPOSOME 1.3 % IJ SUSP
INTRAMUSCULAR | Status: DC | PRN
  Administered 2022-12-11: 10 mL via PERINEURAL

## 2022-12-11 MED ORDER — ONDANSETRON HCL 4 MG/2ML IJ SOLN
INTRAMUSCULAR | Status: AC
  Filled 2022-12-11: qty 2

## 2022-12-11 MED ORDER — MEMANTINE HCL 5 MG PO TABS
10.0000 mg | ORAL_TABLET | Freq: Two times a day (BID) | ORAL | Status: DC
  Administered 2022-12-11 – 2022-12-12 (×2): 10 mg via ORAL
  Filled 2022-12-11 (×2): qty 2

## 2022-12-11 MED ORDER — BUPIVACAINE HCL (PF) 0.5 % IJ SOLN
INTRAMUSCULAR | Status: DC | PRN
  Administered 2022-12-11: 10 mL via PERINEURAL

## 2022-12-11 MED ORDER — CHLORHEXIDINE GLUCONATE 0.12 % MT SOLN: 15.0000 mL | Freq: Once | OROMUCOSAL | Status: AC

## 2022-12-11 MED ORDER — METOCLOPRAMIDE HCL 5 MG/ML IJ SOLN: 5.0000 mg | Freq: Three times a day (TID) | INTRAMUSCULAR | Status: DC | PRN

## 2022-12-11 MED ORDER — ACETAMINOPHEN 10 MG/ML IV SOLN
INTRAVENOUS | Status: AC
  Filled 2022-12-11: qty 100

## 2022-12-11 MED ORDER — DEXAMETHASONE SODIUM PHOSPHATE 10 MG/ML IJ SOLN
INTRAMUSCULAR | Status: DC | PRN
  Administered 2022-12-11: 10 mg via INTRAVENOUS

## 2022-12-11 MED ORDER — MIDAZOLAM HCL 2 MG/2ML IJ SOLN
INTRAMUSCULAR | Status: AC
  Administered 2022-12-11: 1 mg via INTRAVENOUS
  Filled 2022-12-11: qty 2

## 2022-12-11 MED ORDER — KETAMINE HCL 50 MG/5ML IJ SOSY
PREFILLED_SYRINGE | INTRAMUSCULAR | Status: AC
  Filled 2022-12-11: qty 5

## 2022-12-11 MED ORDER — CEFAZOLIN SODIUM-DEXTROSE 2-4 GM/100ML-% IV SOLN
2.0000 g | Freq: Four times a day (QID) | INTRAVENOUS | Status: AC
  Administered 2022-12-11: 2 g via INTRAVENOUS
  Filled 2022-12-11 (×2): qty 100

## 2022-12-11 MED ORDER — PANTOPRAZOLE SODIUM 40 MG PO TBEC
40.0000 mg | DELAYED_RELEASE_TABLET | Freq: Every day | ORAL | Status: DC
  Administered 2022-12-12: 40 mg via ORAL
  Filled 2022-12-11: qty 1

## 2022-12-11 MED ORDER — TRANEXAMIC ACID-NACL 1000-0.7 MG/100ML-% IV SOLN
1000.0000 mg | INTRAVENOUS | Status: AC
  Administered 2022-12-11: 1000 mg via INTRAVENOUS

## 2022-12-11 MED ORDER — ROCURONIUM BROMIDE 100 MG/10ML IV SOLN
INTRAVENOUS | Status: DC | PRN
  Administered 2022-12-11: 50 mg via INTRAVENOUS

## 2022-12-11 MED ORDER — SODIUM CHLORIDE FLUSH 0.9 % IV SOLN
INTRAVENOUS | Status: AC
  Filled 2022-12-11: qty 40

## 2022-12-11 MED ORDER — SODIUM CHLORIDE 0.9 % IV SOLN: INTRAVENOUS | Status: DC

## 2022-12-11 MED ORDER — PHENYLEPHRINE 80 MCG/ML (10ML) SYRINGE FOR IV PUSH (FOR BLOOD PRESSURE SUPPORT)
PREFILLED_SYRINGE | INTRAVENOUS | Status: DC | PRN
  Administered 2022-12-11: 80 ug via INTRAVENOUS
  Administered 2022-12-11 (×2): 160 ug via INTRAVENOUS
  Administered 2022-12-11: 80 ug via INTRAVENOUS

## 2022-12-11 MED ORDER — DULOXETINE HCL 30 MG PO CPEP
60.0000 mg | ORAL_CAPSULE | Freq: Every day | ORAL | Status: DC
  Administered 2022-12-12: 60 mg via ORAL
  Filled 2022-12-11: qty 2

## 2022-12-11 MED ORDER — FENTANYL CITRATE (PF) 100 MCG/2ML IJ SOLN
INTRAMUSCULAR | Status: AC
  Filled 2022-12-11: qty 2

## 2022-12-11 MED ORDER — OXYCODONE HCL 5 MG PO TABS: 5.0000 mg | ORAL_TABLET | Freq: Once | ORAL | Status: DC | PRN

## 2022-12-11 MED ORDER — INSULIN ASPART 100 UNIT/ML IJ SOLN
0.0000 [IU] | Freq: Three times a day (TID) | INTRAMUSCULAR | Status: DC
  Administered 2022-12-11 – 2022-12-12 (×2): 3 [IU] via SUBCUTANEOUS
  Filled 2022-12-11 (×3): qty 1

## 2022-12-11 MED ORDER — PROPOFOL 10 MG/ML IV BOLUS
INTRAVENOUS | Status: DC | PRN
  Administered 2022-12-11: 120 mg via INTRAVENOUS

## 2022-12-11 MED ORDER — LIDOCAINE HCL (PF) 1 % IJ SOLN
INTRAMUSCULAR | Status: AC
  Filled 2022-12-11: qty 5

## 2022-12-11 MED ORDER — SODIUM CHLORIDE 0.9 % IR SOLN
Status: DC | PRN
  Administered 2022-12-11: 3000 mL

## 2022-12-11 MED ORDER — METOCLOPRAMIDE HCL 5 MG PO TABS
5.0000 mg | ORAL_TABLET | Freq: Three times a day (TID) | ORAL | Status: DC | PRN
  Administered 2022-12-12: 10 mg via ORAL
  Filled 2022-12-11: qty 2

## 2022-12-11 MED ORDER — LACTATED RINGERS IV SOLN: INTRAVENOUS | Status: DC

## 2022-12-11 MED ORDER — BUPIVACAINE HCL (PF) 0.5 % IJ SOLN
INTRAMUSCULAR | Status: AC
  Filled 2022-12-11: qty 20

## 2022-12-11 MED ORDER — MENTHOL 3 MG MT LOZG: 1.0000 | LOZENGE | OROMUCOSAL | Status: DC | PRN

## 2022-12-11 MED ORDER — DEXAMETHASONE SODIUM PHOSPHATE 10 MG/ML IJ SOLN
INTRAMUSCULAR | Status: AC
  Filled 2022-12-11: qty 1

## 2022-12-11 MED ORDER — ACETAMINOPHEN 10 MG/ML IV SOLN
INTRAVENOUS | Status: DC | PRN
  Administered 2022-12-11: 1000 mg via INTRAVENOUS

## 2022-12-11 MED ORDER — 0.9 % SODIUM CHLORIDE (POUR BTL) OPTIME
TOPICAL | Status: DC | PRN
  Administered 2022-12-11: 1000 mL

## 2022-12-11 MED ORDER — BUPIVACAINE-EPINEPHRINE (PF) 0.5% -1:200000 IJ SOLN
INTRAMUSCULAR | Status: AC
  Filled 2022-12-11: qty 30

## 2022-12-11 MED ORDER — ACETAMINOPHEN 325 MG PO TABS: 325.0000 mg | ORAL_TABLET | Freq: Four times a day (QID) | ORAL | Status: DC | PRN

## 2022-12-11 MED ORDER — RIVAROXABAN 10 MG PO TABS
10.0000 mg | ORAL_TABLET | Freq: Every day | ORAL | Status: DC
  Administered 2022-12-12: 10 mg via ORAL
  Filled 2022-12-11: qty 1

## 2022-12-11 MED ORDER — HYDROXYZINE HCL 25 MG PO TABS
25.0000 mg | ORAL_TABLET | Freq: Every day | ORAL | Status: DC
  Administered 2022-12-11: 25 mg via ORAL
  Filled 2022-12-11: qty 1

## 2022-12-11 MED ORDER — LIDOCAINE HCL (PF) 2 % IJ SOLN
INTRAMUSCULAR | Status: AC
  Filled 2022-12-11: qty 5

## 2022-12-11 MED ORDER — SODIUM CHLORIDE (PF) 0.9 % IJ SOLN
INTRAMUSCULAR | Status: DC | PRN
  Administered 2022-12-11: 80 mL via INTRAMUSCULAR

## 2022-12-11 MED ORDER — SUGAMMADEX SODIUM 200 MG/2ML IV SOLN
INTRAVENOUS | Status: DC | PRN
  Administered 2022-12-11: 200 mg via INTRAVENOUS

## 2022-12-11 MED ORDER — ROCURONIUM BROMIDE 10 MG/ML (PF) SYRINGE
PREFILLED_SYRINGE | INTRAVENOUS | Status: AC
  Filled 2022-12-11: qty 10

## 2022-12-11 MED ORDER — STERILE WATER FOR IRRIGATION IR SOLN
Status: DC | PRN
  Administered 2022-12-11: 1000 mL

## 2022-12-11 MED ORDER — MORPHINE SULFATE (PF) 2 MG/ML IV SOLN: 0.5000 mg | INTRAVENOUS | Status: DC | PRN

## 2022-12-11 MED ORDER — BUPIVACAINE LIPOSOME 1.3 % IJ SUSP
INTRAMUSCULAR | Status: AC
  Filled 2022-12-11: qty 10

## 2022-12-11 MED ORDER — ONDANSETRON HCL 4 MG/2ML IJ SOLN: 4.0000 mg | Freq: Four times a day (QID) | INTRAMUSCULAR | Status: DC | PRN

## 2022-12-11 MED ORDER — POVIDONE-IODINE 10 % EX SWAB: 2.0000 | Freq: Once | CUTANEOUS | Status: DC

## 2022-12-11 MED ORDER — HYDROCODONE-ACETAMINOPHEN 5-325 MG PO TABS
1.0000 | ORAL_TABLET | ORAL | Status: DC | PRN
  Administered 2022-12-11 – 2022-12-12 (×2): 2 via ORAL
  Filled 2022-12-11 (×2): qty 2

## 2022-12-11 MED ORDER — IRRISEPT - 450ML BOTTLE WITH 0.05% CHG IN STERILE WATER, USP 99.95% OPTIME
TOPICAL | Status: DC | PRN
  Administered 2022-12-11: 450 mL

## 2022-12-11 MED ORDER — MIDAZOLAM HCL 2 MG/2ML IJ SOLN: 1.0000 mg | Freq: Once | INTRAMUSCULAR | Status: AC

## 2022-12-11 MED ORDER — LISINOPRIL 5 MG PO TABS
2.5000 mg | ORAL_TABLET | Freq: Every day | ORAL | Status: DC
  Administered 2022-12-12: 2.5 mg via ORAL
  Filled 2022-12-11: qty 1

## 2022-12-11 MED ORDER — CEFAZOLIN SODIUM-DEXTROSE 2-4 GM/100ML-% IV SOLN
2.0000 g | INTRAVENOUS | Status: AC
  Administered 2022-12-11: 2 g via INTRAVENOUS

## 2022-12-11 MED ORDER — BISACODYL 10 MG RE SUPP: 10.0000 mg | Freq: Every day | RECTAL | Status: DC | PRN

## 2022-12-11 MED ORDER — LABETALOL HCL 5 MG/ML IV SOLN
INTRAVENOUS | Status: DC | PRN
  Administered 2022-12-11: 5 mg via INTRAVENOUS

## 2022-12-11 MED ORDER — ORAL CARE MOUTH RINSE: 15.0000 mL | Freq: Once | OROMUCOSAL | Status: AC

## 2022-12-11 MED ORDER — FENTANYL CITRATE PF 50 MCG/ML IJ SOSY
PREFILLED_SYRINGE | INTRAMUSCULAR | Status: AC
  Administered 2022-12-11: 50 ug via INTRAVENOUS
  Filled 2022-12-11: qty 1

## 2022-12-11 MED ORDER — ONDANSETRON HCL 4 MG PO TABS: 4.0000 mg | ORAL_TABLET | Freq: Four times a day (QID) | ORAL | Status: DC | PRN

## 2022-12-11 MED ORDER — HYDROCODONE-ACETAMINOPHEN 7.5-325 MG PO TABS
1.0000 | ORAL_TABLET | ORAL | Status: DC | PRN
  Administered 2022-12-12: 2 via ORAL
  Filled 2022-12-11: qty 2

## 2022-12-11 MED ORDER — DEXMEDETOMIDINE HCL IN NACL 80 MCG/20ML IV SOLN
INTRAVENOUS | Status: DC | PRN
  Administered 2022-12-11 (×3): 8 ug via BUCCAL

## 2022-12-11 MED ORDER — PHENYLEPHRINE 80 MCG/ML (10ML) SYRINGE FOR IV PUSH (FOR BLOOD PRESSURE SUPPORT)
PREFILLED_SYRINGE | INTRAVENOUS | Status: AC
  Filled 2022-12-11: qty 10

## 2022-12-11 MED ORDER — LABETALOL HCL 5 MG/ML IV SOLN
INTRAVENOUS | Status: AC
  Filled 2022-12-11: qty 4

## 2022-12-11 SURGICAL SUPPLY — 59 items
BASEPLATE TIBIAL RT SZ 5 (Knees) IMPLANT
BLADE SAGITTAL AGGR TOOTH XLG (BLADE) ×1 IMPLANT
BLADE SAW SAG 25X90X1.19 (BLADE) ×1 IMPLANT
BOWL CEMENT MIX W/ADAPTER (MISCELLANEOUS) ×1 IMPLANT
BRUSH SCRUB EZ  4% CHG (MISCELLANEOUS) ×1
BRUSH SCRUB EZ 4% CHG (MISCELLANEOUS) ×1 IMPLANT
CEMENT BONE 40GM (Cement) ×2 IMPLANT
CHLORAPREP W/TINT 26 (MISCELLANEOUS) ×2 IMPLANT
COMP PATELLA GENESIS 35MM (Stem) ×1 IMPLANT
COMPONENT PATELLA GENESIS 35MM (Stem) IMPLANT
COOLER POLAR GLACIER W/PUMP (MISCELLANEOUS) ×1 IMPLANT
CUFF TOURN SGL QUICK 34 (TOURNIQUET CUFF) ×1
CUFF TRNQT CYL 34X4.125X (TOURNIQUET CUFF) ×1 IMPLANT
DRAPE 3/4 80X56 (DRAPES) ×2 IMPLANT
DRAPE INCISE IOBAN 66X60 STRL (DRAPES) ×1 IMPLANT
ELECT REM PT RETURN 9FT ADLT (ELECTROSURGICAL) ×1
ELECTRODE REM PT RTRN 9FT ADLT (ELECTROSURGICAL) ×1 IMPLANT
FEM COMP OXINIUM 5N RT NARROW (Orthopedic Implant) ×1 IMPLANT
FEMORAL COMP OXINIUM 5N RT NRW (Orthopedic Implant) IMPLANT
GAUZE PAD ABD 8X10 STRL (GAUZE/BANDAGES/DRESSINGS) ×2 IMPLANT
GAUZE SPONGE 4X4 12PLY STRL (GAUZE/BANDAGES/DRESSINGS) ×1 IMPLANT
GAUZE XEROFORM 1X8 LF (GAUZE/BANDAGES/DRESSINGS) ×1 IMPLANT
GLOVE BIO SURGEON STRL SZ8 (GLOVE) ×2 IMPLANT
GLOVE BIOGEL PI IND STRL 8.5 (GLOVE) ×2 IMPLANT
GLOVE PI ORTHO PRO STRL SZ8 (GLOVE) ×2 IMPLANT
GLOVE SURG ORTHO 8.5 STRL (GLOVE) IMPLANT
GOWN STRL REUS W/ TWL XL LVL3 (GOWN DISPOSABLE) ×2 IMPLANT
GOWN STRL REUS W/TWL XL LVL3 (GOWN DISPOSABLE) ×2
HOOD PEEL AWAY T7 (MISCELLANEOUS) ×3 IMPLANT
INSERT TIB XLPE 13 SZ5-6 (Insert) IMPLANT
IRRIGATION SURGIPHOR STRL (IV SOLUTION) ×1 IMPLANT
IV NS IRRIG 3000ML ARTHROMATIC (IV SOLUTION) ×1 IMPLANT
KIT TURNOVER KIT A (KITS) ×1 IMPLANT
MANIFOLD NEPTUNE II (INSTRUMENTS) ×1 IMPLANT
MAT ABSORB  FLUID 56X50 GRAY (MISCELLANEOUS) ×1
MAT ABSORB FLUID 56X50 GRAY (MISCELLANEOUS) ×1 IMPLANT
NDL SAFETY ECLIP 18X1.5 (MISCELLANEOUS) ×1 IMPLANT
NDL SPNL 20GX3.5 QUINCKE YW (NEEDLE) ×1 IMPLANT
NEEDLE SPNL 20GX3.5 QUINCKE YW (NEEDLE) ×1 IMPLANT
NS IRRIG 1000ML POUR BTL (IV SOLUTION) ×1 IMPLANT
PACK TOTAL KNEE (MISCELLANEOUS) ×1 IMPLANT
PAD DE MAYO PRESSURE PROTECT (MISCELLANEOUS) ×1 IMPLANT
PAD WRAPON POLAR KNEE (MISCELLANEOUS) ×1 IMPLANT
PULSAVAC PLUS IRRIG FAN TIP (DISPOSABLE) ×1
STAPLER SKIN PROX 35W (STAPLE) ×1 IMPLANT
SUCTION FRAZIER HANDLE 10FR (MISCELLANEOUS) ×1
SUCTION TUBE FRAZIER 10FR DISP (MISCELLANEOUS) ×1 IMPLANT
SUT DVC 2 QUILL PDO  T11 36X36 (SUTURE) ×1
SUT DVC 2 QUILL PDO T11 36X36 (SUTURE) ×1 IMPLANT
SUT VIC AB 2-0 CT1 18 (SUTURE) ×1 IMPLANT
SUT VIC AB 2-0 CT1 27 (SUTURE)
SUT VIC AB 2-0 CT1 TAPERPNT 27 (SUTURE) IMPLANT
SUT VIC AB PLUS 45CM 1-MO-4 (SUTURE) ×1 IMPLANT
SYR 30ML LL (SYRINGE) ×3 IMPLANT
TIBIAL BASEPLATE SZ 5 (Knees) ×1 IMPLANT
TIP FAN IRRIG PULSAVAC PLUS (DISPOSABLE) ×1 IMPLANT
TRAP FLUID SMOKE EVACUATOR (MISCELLANEOUS) ×1 IMPLANT
WATER STERILE IRR 500ML POUR (IV SOLUTION) ×1 IMPLANT
WRAPON POLAR PAD KNEE (MISCELLANEOUS) ×1

## 2022-12-11 NOTE — Anesthesia Procedure Notes (Signed)
Anesthesia Regional Block: Adductor canal block   Pre-Anesthetic Checklist: , timeout performed,  Correct Patient, Correct Site, Correct Laterality,  Correct Procedure, Correct Position, site marked,  Risks and benefits discussed,  Surgical consent,  Pre-op evaluation,  At surgeon's request and post-op pain management  Laterality: Lower and Right  Prep: chloraprep       Needles:  Injection technique: Single-shot  Needle Type: Echogenic Needle     Needle Length: 9cm  Needle Gauge: 21     Additional Needles:   Procedures:,,,, ultrasound used (permanent image in chart),,    Narrative:  Start time: 12/11/2022 9:37 AM End time: 12/11/2022 9:38 AM Injection made incrementally with aspirations every 5 mL.  Performed by: Personally  Anesthesiologist: Tarry Fountain, Precious Haws, MD  Additional Notes: Daughter consented for risk and benefits of nerve block including but not limited to nerve damage, failed block, bleeding and infection.  Patient voiced understanding.  Functioning IV was confirmed and monitors were applied.  Timeout done prior to procedure and prior to any sedation being given to the patient.  Patient confirmed procedure site prior to any sedation given to the patient.  A 19m 22ga Stimuplex needle was used. Sterile prep,hand hygiene and sterile gloves were used.  Minimal sedation used for procedure.  No paresthesia endorsed by patient during the procedure.  Negative aspiration and negative test dose prior to incremental administration of local anesthetic. The patient tolerated the procedure well with no immediate complications.

## 2022-12-11 NOTE — Anesthesia Postprocedure Evaluation (Signed)
Anesthesia Post Note  Patient: Dawn Moore  Procedure(s) Performed: TOTAL KNEE ARTHROPLASTY (Right: Knee)  Patient location during evaluation: PACU Anesthesia Type: Regional Level of consciousness: awake and alert Pain management: pain level controlled Vital Signs Assessment: post-procedure vital signs reviewed and stable Respiratory status: spontaneous breathing, nonlabored ventilation, respiratory function stable and patient connected to nasal cannula oxygen Cardiovascular status: blood pressure returned to baseline and stable Postop Assessment: no apparent nausea or vomiting Anesthetic complications: no   No notable events documented.   Last Vitals:  Vitals:   12/11/22 1229 12/11/22 1230  BP:  106/62  Pulse: 79 81  Resp: 12 12  Temp:  (!) 36.2 C  SpO2: (!) 89% (!) 89%    Last Pain:  Vitals:   12/11/22 1230  TempSrc:   PainSc: [place]

## 2022-12-11 NOTE — Op Note (Signed)
DATE OF SURGERY:  12/11/2022 TIME: 11:37 AM  PATIENT NAME:  Dawn Moore   AGE: 81 y.o.    PRE-OPERATIVE DIAGNOSIS:  M17.11 Unilateral primary osteoarthritis, right knee  POST-OPERATIVE DIAGNOSIS:  Same  PROCEDURE:  Procedure(s): TOTAL KNEE ARTHROPLASTY, RIGHT  SURGEON:  Lovell Sheehan, MD   ASSISTANT:  Carlynn Spry,  PA-C  OPERATIVE IMPLANTS: Tamala [employer]hew, Cruciate Retaining Oxinium Femoral component size 5 narrow, Fixed Bearing Tray size 5, Patella polyethylene 3-peg oval button size 35 mm, with a 13 mm DISH insert.   PREOPERATIVE INDICATIONS:  Dawn Moore is an 81 y.o. female who has a diagnosis of M17.11 Unilateral primary osteoarthritis, right knee and elected for a total knee arthroplasty after failing nonoperative treatment, including activity modification, pain medication, physical therapy and injections who has significant impairment of their activities of daily living.  Radiographs have demonstrated tricompartmental osteoarthritis joint space narrowing, osteophytes, subchondral sclerosis and cyst formation.  The risks, benefits, and alternatives were discussed at length including but not limited to the risks of infection, bleeding, nerve or blood vessel injury, knee stiffness, fracture, dislocation, loosening or failure of the hardware and the need for further surgery. Medical risks include but not limited to DVT and pulmonary embolism, myocardial infarction, stroke, pneumonia, respiratory failure and death. I discussed these risks with the patient in my office prior to the date of surgery. They understood these risks and were willing to proceed.  OPERATIVE FINDINGS AND UNIQUE ASPECTS OF THE CASE:  All three compartments with advanced and severe degenerative changes, large osteophytes and an abundance of synovial fluid. Significant deformity was also noted. A decision was made to proceed with total knee arthroplasty.   OPERATIVE DESCRIPTION:  The patient was brought to  the operative room and placed in a supine position after undergoing placement of a general anesthetic. IV antibiotics were given. Patient received tranexamic acid. The lower extremity was prepped and draped in the usual sterile fashion.  A time out was performed to verify the patient's name, date of birth, medical record number, correct site of surgery and correct procedure to be performed. The timeout was also used to confirm the patient received antibiotics and that appropriate instruments, implants and radiographs studies were available in the room.  The leg was elevated and exsanguinated with an Esmarch and the tourniquet was inflated to 250 mmHg.  A midline incision was made over the left knee.. A medial parapatellar arthrotomy was then made and the patella subluxed laterally and the knee was brought into 90 of flexion. Hoffa's fat pad along with the anterior cruciate ligament was resected and the medial joint line was exposed.  Attention was then turned to preparation of the patella. The thickness of the patella was measured with a caliper, the diameter measured with the patella templates.  The patella resection was then made with an oscillating saw using the patella cutting guide.  The 35 mm button fit appropriately.  3 peg holes for the patella component were then drilled.  The extramedullary tibial cutting guide was then placed using the anterior tibial crest and second ray of the foot as a reference.  The tibial cutting guide was adjusted to allow for appropriate posterior slope.  The tibial cutting block was pinned into position. The slotted stylus was used to measure the proximal tibial resection of 9 mm off the high lateral side. Care was taken during the tibial resection to protect the medial and collateral ligaments.  The resected tibial bone was removed.  The distal femur was resected using the intramedullary cutting guide.  Care was taken to protect the collateral ligaments during distal  femoral resection.  The distal femoral resection was performed with an oscillating saw. The femoral cutting guide was then removed. Extension gap was measured with a 13 mm spacer block and alignment and extension was confirmed using a long alignment rod. The femur was sized to be a 5 narrow. Rotation of the referencing guide was checked with the epicondylar axis and Whitesides line. Then the 4-in-1 cutting jig was then applied to the distal femur. A stylus was used to confirm that the anterior femur would not be notched.   Then the anterior, posterior and chamfer femoral cuts were then made with an oscillating saw.  The knee was distracted and all posterior osteophytes were removed.  The flexion gap was then measured with a flexion spacer block and long alignment rod and was found to be symmetric with the extension gap and perpendicular to mechanical axis of the tibia.  The proximal tibia plateau was then sized with trial trays. The best coverage was achieved with a size 5. This tibial tray was then pinned into position. The proximal tibia was then prepared with the keel punch.  After tibial preparation was completed, all trial components were inserted with polyethylene trials. The knee achieved full extension and flexed to 120 degrees. Ligament were stable to varus and valgus at full extension as well as 30, 60 and 90 degrees of flexion.   The trials were then placed. Knee was taken through a full range of motion and deemed to be stable with the trial components. All trial components were then removed.  The joint was copiously irrigated with pulse lavage.  The final total knee arthroplasty components were then cemented into place. The knee was held in extension while cement was allowed to cure.The knee was taken through a range of motion and the patella tracked well and the knee was again irrigated copiously.  The knee capsule was then injected with Exparel.  The medial arthrotomy was closed with #1 Vicryl  and #2 Quill. The subcutaneous tissue closed with  2-0 vicryl, and skin approximated with staples.  A dry sterile and compressive dressing was applied.  A Polar Care was applied to the operative knee.  The patient was awakened and brought to the PACU in stable and satisfactory condition.  All sharp, lap and instrument counts were correct at the conclusion the case. I spoke with the patient's family in the postop consultation room to let them know the case had been performed without complication and the patient was stable in recovery room.   Total tourniquet time was 44 minutes.

## 2022-12-11 NOTE — Evaluation (Signed)
Physical Therapy Evaluation Patient Details Name: Dawn Moore MRN: [id number] DOB: 1941-04-06 Today's Date: 12/11/2022  History of Present Illness  Pt is an 81 y.o. female s/p R TKA 12/11/22 d/t unilateral primary OA.  PMH includes PVD, h/o recurrent UTI's, memory loss, ankle fx s/p sx, htn, DM, diabetic neuropathy, anxiety disorder, sleep apnea, kyphoplasty.  Clinical Impression  Prior to surgery, pt reports being independent with ambulation; lives with her daughter (pt reports daughter is on disability but able to assist); 1 level home with 2 STE (pt reports no railing but pt's daughter verified that pt has B railings on front entrance that is best entrance for pt).  Pt oriented to person, place, time, and that she was in hospital for R knee surgery but did not know that she had it yet (pt reports she really did not know she had surgery already).  Pt often joking around during session and therapist had to question pt intermittently to fully understand if pt was joking or serious.  Therapist was unsure if information obtained during session was accurate (no family was present during session) so therapist returned later (after session) once pt's daughter arrived to talk with pt and pt's daughter.  Pt demonstrates STM impairments; pt inconsistent with information given and repetitive during session.  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  Minimal R knee pain during session.  R knee AROM 5-90 degrees and able to perform R LE SLR independently.  Currently pt is SBA semi-supine to sitting edge of bed; CGA with transfers using RW; and CGA to ambulate 5 feet bed to recliner with RW use.  Pt would benefit from  skilled PT to address noted impairments and functional limitations (see below for any additional details).  Upon hospital discharge, pt would benefit from [place] and assist from daughter (pt's daughter reports having a taller vehicle and will be difficult to get pt to/from OP appointments but is able to assist pt at home as needed).    Recommendations for follow up therapy are one component of a multi-disciplinary discharge planning process, led by the attending physician.  Recommendations may be updated based on patient status, additional functional criteria and insurance authorization.  Follow Up Recommendations Home health PT      Assistance Recommended at Discharge Frequent or constant Supervision/Assistance  Patient can return home with the following  A little help with walking and/or transfers;A little help with bathing/dressing/bathroom;Assistance with cooking/housework;Direct supervision/assist for medications management;Direct supervision/assist for financial management;Assist for transportation;Help with stairs or ramp for entrance    Equipment Recommendations Rolling walker (2 wheels);BSC/3in1  Recommendations for Other Services       Functional Status Assessment Patient has had a recent decline in their functional status and demonstrates the ability to make significant improvements in function in a reasonable and predictable amount of time.     Precautions / Restrictions Precautions Precautions: Fall;Knee Precaution Booklet Issued: Yes (comment) Restrictions Weight Bearing Restrictions: Yes RLE Weight Bearing: Weight bearing as tolerated      Mobility  Bed Mobility Overal bed mobility: Needs Assistance Bed Mobility: Supine to Sit     Supine to sit: Supervision, HOB elevated     General bed mobility comments: SBA for lines  Transfers Overall transfer level: Needs assistance Equipment used: Rolling walker (2 wheels) Transfers: Sit to/from Stand Sit to Stand: Min  guard           General transfer comment: vc's for UE/LE placement and overall technique    Ambulation/Gait Ambulation/Gait assistance: Min guard Gait Distance (Feet): 5 Feet (bed to recliner) Assistive device: Rolling walker (2 wheels)   Gait velocity: decreased     General Gait Details: antalgic; decreased stance time R LE; vc's for walker use  Stairs            Wheelchair Mobility    Modified Rankin (Stroke Patients Only)       Balance Overall balance assessment: Needs assistance Sitting-balance support: No upper extremity supported, Feet supported Sitting balance-Leahy Scale: Good Sitting balance - Comments: steady sitting reaching within BOS   Standing balance support: Bilateral upper extremity supported, During functional activity Standing balance-Leahy Scale: Good Standing balance comment: no loss of balance with standing activities during session with RW use                             Pertinent Vitals/Pain Pain Assessment Pain Assessment: Faces Faces Pain Scale: Hurts a little bit Pain Location: R knee Pain Descriptors / Indicators: Sore Pain Intervention(s): Limited activity within patient's tolerance, Monitored during session, Repositioned, Other (comment) (polar care applied) Vitals (HR and O2 on room air) stable and WFL throughout treatment session.    Home Living Family/patient expects to be discharged to:: Private residence Living Arrangements: Children (pt's daughter) Available Help at Discharge: Family;Available 24 hours/day (pt reports her daughter is on disability and can assist 24/7) Type of Home: House Home Access: Stairs to enter Entrance Stairs-Rails: Right;Left;Can reach both Entrance Stairs-Number of Steps: 2   Home Layout: One level Home Equipment: Grab bars - tub/shower;Shower seat - built Medical sales representative (2 wheels);Cane - single point Additional Comments: Pt's daughter reports unsure if still has RW (was  daughter's old walker).    Prior Function Prior Level of Function : Independent/Modified Independent             Mobility Comments: Independent with ambulation (can be a little "wobbly" when first walking); no recent falls but has h/o falls. ADLs Comments: Daughter assists with meals, medications, and bills.     Hand Dominance        Extremity/Trunk Assessment   Upper Extremity Assessment Upper Extremity Assessment: Generalized weakness (pt reporting mild B shoulder pain intermittently during session (pt reports having it for months))    Lower Extremity Assessment Lower Extremity Assessment: RLE deficits/detail (L LE WFL) RLE Deficits / Details: able to perform R LE SLR independently; at least 3/5 AROM hip flexion and ankle DF/PF RLE: Unable to fully assess due to pain       Communication   Communication: HOH  Cognition Arousal/Alertness: Awake/alert Behavior During Therapy: Impulsive Overall Cognitive Status: No family/caregiver present to determine baseline cognitive functioning                                 General Comments: Oriented to person, place, time, and that she was here for surgery although pt reported she did not have surgery yet.        General Comments  Nursing cleared pt for participation in physical therapy.  Pt agreeable to PT session.    Exercises Total Joint Exercises Ankle Circles/Pumps: AROM, Strengthening,  Both, 10 reps, Supine Quad Sets: AROM, Strengthening, Both, 10 reps, Supine Short Arc Quad: AROM, Strengthening, Right, 10 reps, Supine Heel Slides: AROM, Strengthening, Right, 10 reps, Supine Hip ABduction/ADduction: AROM, Strengthening, Right, 10 reps, Supine Straight Leg Raises: AROM, Strengthening, Right, 10 reps, Supine Goniometric ROM: R knee AROM 5-90 degrees   Assessment/Plan    PT Assessment Patient needs continued PT services  PT Problem List Decreased strength;Decreased range of motion;Decreased activity  tolerance;Decreased balance;Decreased mobility;Decreased knowledge of use of DME;Decreased knowledge of precautions;Decreased skin integrity;Pain       PT Treatment Interventions DME instruction;Gait training;Stair training;Functional mobility training;Therapeutic activities;Therapeutic exercise;Balance training;Patient/family education    PT Goals (Current goals can be found in the Care Plan section)  Acute Rehab PT Goals Patient Stated Goal: to improve walking and pain PT Goal Formulation: With patient Time For Goal Achievement: 12/25/22 Potential to Achieve Goals: Good    Frequency BID     Co-evaluation               AM-PAC PT "6 Clicks" Mobility  Outcome Measure Help needed turning from your back to your side while in a flat bed without using bedrails?: A Little Help needed moving from lying on your back to sitting on the side of a flat bed without using bedrails?: A Little Help needed moving to and from a bed to a chair (including a wheelchair)?: A Little Help needed standing up from a chair using your arms (e.g., wheelchair or bedside chair)?: A Little Help needed to walk in hospital room?: A Little Help needed climbing 3-5 steps with a railing? : A Little 6 Click Score: 18    End of Session Equipment Utilized During Treatment: Gait belt Activity Tolerance: Patient tolerated treatment well Patient left: in chair;with call bell/phone within reach;with chair alarm set;Other (comment) (B heels floating via towel support; polar care in place) Nurse Communication: Mobility status;Precautions;Weight bearing status PT Visit Diagnosis: Other abnormalities of gait and mobility (R26.89);Muscle weakness (generalized) (M62.81);Pain Pain - Right/Left: Right Pain - part of body: Knee    Time: 8937-3428 PT Time Calculation (min) (ACUTE ONLY): 48 min   Charges:   PT Evaluation $PT Eval Low Complexity: 1 Low PT Treatments $Therapeutic Exercise: 8-22 mins $Therapeutic  Activity: 8-22 mins       Leitha Bleak, PT 12/11/22, 4:50 PM

## 2022-12-11 NOTE — Anesthesia Preprocedure Evaluation (Addendum)
Anesthesia Evaluation  Patient identified by MRN, date of birth, ID band Patient awake    Reviewed: Allergy & Precautions, NPO status , Patient's Chart, lab work & pertinent test results  History of Anesthesia Complications Negative for: history of anesthetic complications  Airway Mallampati: III  TM Distance: <3 FB Neck ROM: full    Dental  (+) Chipped   Pulmonary shortness of breath and with exertion, asthma , sleep apnea    Pulmonary exam normal        Cardiovascular Exercise Tolerance: Good hypertension, (-) angina + Peripheral Vascular Disease  Normal cardiovascular exam     Neuro/Psych  Headaches  Anxiety Depression   Dementia  negative psych ROS   GI/Hepatic Neg liver ROS,GERD  Controlled,,  Endo/Other  negative endocrine ROSdiabetes, Type 2    Renal/GU Renal disease     Musculoskeletal   Abdominal   Peds  Hematology negative hematology ROS (+)   Anesthesia Other Findings Past Medical History: No date: Anxiety No date: Arthritis     Comment:  OA knees, hands and feet No date: Asthma No date: Cancer ([facility])     Comment:  squamous cell carcinoma leg No date: Dementia ([facility]) No date: Depression No date: DM (diabetes mellitus), type 2 ([facility])     Comment:  controlled no meds No date: GERD (gastroesophageal reflux disease) No date: Headache     Comment:  migraines No date: Hypertension 2021: Mini stroke No date: Osteopenia No date: Peripheral vascular disease ([facility]) No date: Shortness of breath dyspnea No date: Sleep apnea     Comment:  doesn't use CPAP 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[facility])     Comment:  eval by hematology years ago No date: UTI (urinary tract infection)  Past Surgical History: 1986: ABDOMINAL HYSTERECTOMY     Comment:  still has one ovary 03/07/2016: CATARACT EXTRACTION W/PHACO; Left     Comment:  Procedure: CATARACT EXTRACTION PHACO AND INTRAOCULAR               LENS PLACEMENT (IOC);   Surgeon: Leandrew Koyanagi, MD;               Location: [facility];  Service: Ophthalmology;                Laterality: Left;  DIABETIC -oral meds Sleep apnea 03/28/2016: CATARACT EXTRACTION W/PHACO; Right     Comment:  Procedure: CATARACT EXTRACTION PHACO AND INTRAOCULAR               LENS PLACEMENT (IOC);  Surgeon: Leandrew Koyanagi, MD;               Location: [facility];  Service: Ophthalmology;                Laterality: Right;  DIABETIC - oral meds 1999: CHOLECYSTECTOMY No date: FOOT FRACTURE SURGERY 11/04/2009: HEEL SPUR SURGERY; Left     Comment:  Revision 01/2011 No date: INTRAOCULAR LENS INSERTION; Bilateral 10/17/2017: KYPHOPLASTY; N/A     Comment:  Procedure: LUMBAR 3 KYPHOPLASTY; REQUEST 1 HOUR;                Surgeon: Phylliss Bob, MD;  Location: [facility];  Service:               Orthopedics;  Laterality: N/A;  LUMBAR 3 KYPHOPLASTY;               REQUEST 1 HOUR  BMI    Body Mass Index: 30.62 kg/m  Reproductive/Obstetrics negative OB ROS                             Anesthesia Physical Anesthesia Plan  ASA: 3  Anesthesia Plan: General ETT   Post-op Pain Management: Regional block*   Induction: Intravenous  PONV Risk Score and Plan: Ondansetron, Dexamethasone, Midazolam and Treatment may vary due to age or medical condition  Airway Management Planned: Oral ETT  Additional Equipment:   Intra-op Plan:   Post-operative Plan: Extubation in OR  Informed Consent: I have reviewed the patients History and Physical, chart, labs and discussed the procedure including the risks, benefits and alternatives for the proposed anesthesia with the patient or authorized representative who has indicated his/her understanding and acceptance.     Dental Advisory Given  Plan Discussed with: Anesthesiologist, CRNA and Surgeon  Anesthesia Plan Comments: (Daughter consented for risks of anesthesia including but not limited to:   - adverse reactions to medications - damage to eyes, teeth, lips or other oral mucosa - nerve damage due to positioning  - sore throat or hoarseness - Damage to heart, brain, nerves, lungs, other parts of body or loss of life  She voiced understanding.)       Anesthesia Quick Evaluation

## 2022-12-11 NOTE — Transfer of Care (Signed)
Immediate Anesthesia Transfer of Care Note  Patient: Dawn Moore  Procedure(s) Performed: TOTAL KNEE ARTHROPLASTY (Right: Knee)  Patient Location: PACU  Anesthesia Type:General and Regional  Level of Consciousness: drowsy  Airway & Oxygen Therapy: Patient Spontanous Breathing and Patient connected to face mask oxygen  Post-op Assessment: Report given to RN, Post -op Vital signs reviewed and stable, and Patient moving all extremities  Post vital signs: Reviewed and stable  Last Vitals:  Vitals Value Taken Time  BP 115/41 12/11/22 1151  Temp    Pulse 74 12/11/22 1154  Resp 10 12/11/22 1154  SpO2 100 % 12/11/22 1154  Vitals shown include unvalidated device data.  Last Pain:  Vitals:   12/11/22 0940  TempSrc:   PainSc: Asleep         Complications: No notable events documented.

## 2022-12-11 NOTE — H&P (Signed)
The patient has been re-examined, and the chart reviewed, and there have been no interval changes to the documented history and physical.  Plan a right total knee today.  Anesthesia is consulted regarding a peripheral nerve block for post-operative pain.  The risks, benefits, and alternatives have been discussed at length, and the patient is willing to proceed.     

## 2022-12-11 NOTE — Anesthesia Procedure Notes (Signed)
Procedure Name: Intubation Date/Time: 12/11/2022 10:15 AM  Performed by: Esaw Grandchild, CRNAPre-anesthesia Checklist: Patient identified, Emergency Drugs available, Suction available and Patient being monitored Patient Re-evaluated:Patient Re-evaluated prior to induction Oxygen Delivery Method: Circle system utilized Preoxygenation: Pre-oxygenation with 100% oxygen Induction Type: IV induction Ventilation: Mask ventilation without difficulty Laryngoscope Size: Miller and 2 Grade View: Grade I Tube type: Oral Tube size: 7.0 mm Number of attempts: 1 Airway Equipment and Method: Stylet, Oral airway and Bite block Placement Confirmation: ETT inserted through vocal cords under direct vision, positive ETCO2 and breath sounds checked- equal and bilateral Secured at: 21 cm Tube secured with: Tape Dental Injury: Teeth and Oropharynx as per pre-operative assessment

## 2022-12-12 ENCOUNTER — Encounter: Payer: Self-pay | Admitting: Orthopedic Surgery

## 2022-12-12 DIAGNOSIS — M1711 Unilateral primary osteoarthritis, right knee: Secondary | ICD-10-CM | POA: Diagnosis not present

## 2022-12-12 LAB — GLUCOSE, CAPILLARY
Glucose-Capillary: 160 mg/dL — ABNORMAL HIGH (ref 70–99)
Glucose-Capillary: 127 mg/dL — ABNORMAL HIGH (ref 70–99)

## 2022-12-12 LAB — HEMOGLOBIN A1C
Hgb A1c MFr Bld: 6.4 % — ABNORMAL HIGH (ref 4.8–5.6)
Mean Plasma Glucose: 137 mg/dL

## 2022-12-12 MED ORDER — HYDROCODONE-ACETAMINOPHEN 5-325 MG PO TABS: 1.0000 | ORAL_TABLET | ORAL | 0 refills | Status: AC | PRN

## 2022-12-12 MED ORDER — METHOCARBAMOL 500 MG PO TABS: 500.0000 mg | ORAL_TABLET | Freq: Three times a day (TID) | ORAL | 1 refills | Status: AC | PRN

## 2022-12-12 MED ORDER — DOCUSATE SODIUM 100 MG PO CAPS: 100.0000 mg | ORAL_CAPSULE | Freq: Two times a day (BID) | ORAL | 0 refills | Status: AC

## 2022-12-12 MED ORDER — RIVAROXABAN 10 MG PO TABS: 10.0000 mg | ORAL_TABLET | Freq: Every day | ORAL | 0 refills | Status: AC

## 2022-12-12 NOTE — Plan of Care (Signed)
  Problem: Education: Goal: Knowledge of General Education information will improve Description: Including pain rating scale, medication(s)/side effects and non-pharmacologic comfort measures Outcome: Progressing   Problem: Health Behavior/Discharge Planning: Goal: Ability to manage health-related needs will improve Outcome: Progressing   Problem: Clinical Measurements: Goal: Ability to maintain clinical measurements within normal limits will improve Outcome: Progressing Goal: Diagnostic test results will improve Outcome: Progressing   Problem: Activity: Goal: Risk for activity intolerance will decrease Outcome: Progressing   Problem: Nutrition: Goal: Adequate nutrition will be maintained Outcome: Progressing   Problem: Elimination: Goal: Will not experience complications related to bowel motility Outcome: Progressing Goal: Will not experience complications related to urinary retention Outcome: Progressing   Problem: Pain Managment: Goal: General experience of comfort will improve Outcome: Progressing   Problem: Safety: Goal: Ability to remain free from injury will improve Outcome: Progressing   Problem: Skin Integrity: Goal: Risk for impaired skin integrity will decrease Outcome: Progressing

## 2022-12-12 NOTE — Progress Notes (Signed)
  Subjective:  Patient reports pain as mild to moderate.    Objective:   VITALS:   Vitals:   12/11/22 1934 12/11/22 2327 12/12/22 0413 12/12/22 0744  BP: 132/67 125/65 124/77 124/61  Pulse: 100 96 90 83  Resp: _0 Temp: 98.1 F (36.7 C) 98.3 F (36.8 C) 97.8 F (36.6 C) 98 F (36.7 C)  TempSrc:      SpO2: 97% 95% 92% 99%  Weight:      Height:        PHYSICAL EXAM:  Neurologically intact ABD soft Neurovascular intact Sensation intact distally Intact pulses distally Dorsiflexion/Plantar flexion intact Incision: dressing C/D/I No cellulitis present Compartment soft  LABS  Results for orders placed or performed during the hospital encounter of 12/11/22 (from the past 24 hour(s))  Glucose, capillary     Status: Abnormal   Collection Time: 12/11/22  8:12 AM  Result Value Ref Range   Glucose-Capillary 124 (H) 70 - 99 mg/dL  ABO/Rh     Status: None   Collection Time: 12/11/22  8:32 AM  Result Value Ref Range   ABO/RH(D)      AB POS Performed at [facility], [address] [place], [place] [address]   Glucose, capillary     Status: Abnormal   Collection Time: 12/11/22 11:57 AM  Result Value Ref Range   Glucose-Capillary 134 (H) 70 - 99 mg/dL  Glucose, capillary     Status: Abnormal   Collection Time: 12/11/22  4:44 PM  Result Value Ref Range   Glucose-Capillary 159 (H) 70 - 99 mg/dL  Glucose, capillary     Status: Abnormal   Collection Time: 12/11/22  9:39 PM  Result Value Ref Range   Glucose-Capillary 234 (H) 70 - 99 mg/dL    [place] OR NERVE BLOCK-IMAGE ONLY [facility])  Result Date: 12/11/2022 There is no interpretation for this exam.  This order is for images obtained during a surgical procedure.  Please See "Surgeries" Tab for more information regarding the procedure.   [place] OR NERVE BLOCK-IMAGE ONLY [facility])  Result Date: 12/11/2022 There is no interpretation for this exam.  This order is for images obtained during a surgical procedure.   Please See "Surgeries" Tab for more information regarding the procedure.    Assessment/Plan: 1 Day Post-Op   Principal Problem:   S/P TKR (total knee replacement) using cement, right   Advance diet Up with therapy Discharge home today if PT goals met   Carlynn Spry , PA-C 12/12/2022, 7:58 AM

## 2022-12-12 NOTE — Progress Notes (Signed)
Physical Therapy Treatment Patient Details Name: Dawn Moore MRN: [id number] DOB: [birth year] Today's Date: 12/12/2022   History of Present Illness Pt is an 81 y.o. female s/p R TKA 12/11/22 d/t unilateral primary OA.  PMH includes PVD, h/o recurrent UTI's, memory loss, ankle fx s/p sx, htn, DM, diabetic neuropathy, anxiety disorder, sleep apnea, kyphoplasty.    PT Comments    Pt was A and O x 4. " I'm just ready to get home." Endorses no pain at rest but 3/10 in wt bearing. Pain did not limit session progression. Pt demonstarted safe ability to exit bed, stand, and ambulate with RW. No LOB or unsteadiness noted. Also, safely performed ascending/descending stairs with BUE support. Pt's supportive daughter was present and will be assisting pt at DC. Recommend DC home with HHPT to follow. Pt will greatly benefit from HHPT at DC to maximize independence, ROM, and strength. Cleared form an acute PT standpoint for safe DC home with HHPT to follow.    Recommendations for follow up therapy are one component of a multi-disciplinary discharge planning process, led by the attending physician.  Recommendations may be updated based on patient status, additional functional criteria and insurance authorization.  Follow Up Recommendations  Home health PT     Assistance Recommended at Discharge Frequent or constant Supervision/Assistance  Patient can return home with the following A little help with walking and/or transfers;A little help with bathing/dressing/bathroom;Assistance with cooking/housework;Direct supervision/assist for medications management;Direct supervision/assist for financial management;Assist for transportation;Help with stairs or ramp for entrance   Equipment Recommendations  Rolling walker (2 wheels);BSC/3in1    Recommendations for Other Services       Precautions / Restrictions Precautions Precautions: Fall;Knee Precaution Booklet Issued: Yes (comment) Restrictions Weight  Bearing Restrictions: Yes RLE Weight Bearing: Weight bearing as tolerated     Mobility  Bed Mobility Overal bed mobility: Modified Independent   Transfers Overall transfer level: Modified independent Equipment used: Rolling walker (2 wheels) Transfers: Sit to/from Stand Sit to Stand: Modified independent (Device/Increase time)   Ambulation/Gait Ambulation/Gait assistance: Supervision Gait Distance (Feet): 200 Feet Assistive device: Rolling walker (2 wheels) Gait Pattern/deviations: WFL(Within Functional Limits) Gait velocity: decreased     General Gait Details: Pt was easily and safely able to ambulate > 200 ft with RW without LOB or safety concerns   Stairs Stairs: Yes Stairs assistance: Modified independent (Device/Increase time) Stair Management: Two rails, Forwards, Step to pattern Number of Stairs: 4      Balance Overall balance assessment: Needs assistance Sitting-balance support: No upper extremity supported, Feet supported Sitting balance-Leahy Scale: Good     Standing balance support: Bilateral upper extremity supported, During functional activity Standing balance-Leahy Scale: Good      Cognition Arousal/Alertness: Awake/alert Behavior During Therapy: WFL for tasks assessed/performed Overall Cognitive Status: Within Functional Limits for tasks assessed              Pertinent Vitals/Pain Pain Assessment Pain Assessment: 0-10 Pain Score: 3  Pain Location: R knee Pain Descriptors / Indicators: Discomfort Pain Intervention(s): Limited activity within patient's tolerance, Monitored during session, Premedicated before session, Repositioned     PT Goals (current goals can now be found in the care plan section) Acute Rehab PT Goals Patient Stated Goal: go home Progress towards PT goals: Progressing toward goals    Frequency    BID      PT Plan Current plan remains appropriate       AM-PAC PT "6 Clicks" Mobility   Outcome Measure  Help  needed turning from your back to your side while in a flat bed without using bedrails?: A Little Help needed moving from lying on your back to sitting on the side of a flat bed without using bedrails?: A Little Help needed moving to and from a bed to a chair (including a wheelchair)?: A Little Help needed standing up from a chair using your arms (e.g., wheelchair or bedside chair)?: A Little Help needed to walk in hospital room?: A Little Help needed climbing 3-5 steps with a railing? : A Little 6 Click Score: 18    End of Session   Activity Tolerance: Patient tolerated treatment well Patient left: in bed;with call bell/phone within reach Nurse Communication: Mobility status;Precautions;Weight bearing status PT Visit Diagnosis: Other abnormalities of gait and mobility (R26.89);Muscle weakness (generalized) (M62.81);Pain Pain - Right/Left: Right Pain - part of body: Knee     Time: 7225-7505 PT Time Calculation (min) (ACUTE ONLY): 31 min  Charges:  $Gait Training: 8-22 mins $Therapeutic Activity: 8-22 mins                     Julaine Fusi PTA 12/12/22, 9:46 AM

## 2022-12-12 NOTE — Discharge Summary (Signed)
Physician Discharge Summary  Patient ID: Dawn Moore MRN: [id number] DOB/AGE: 80-Nov-1942 80 y.o.  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, right  Discharge Diagnoses:  M17.11 Unilateral primary osteoarthritis, right knee Principal Problem:   S/P TKR (total knee replacement) using cement, right   Past Medical History:  Diagnosis Date   Anxiety    Arthritis    OA knees, hands and feet   Asthma    Cancer ([place])    squamous cell carcinoma leg   Dementia (H[facility])    Depression    DM (diabetes mellitus), type 2 ([place])    controlled no meds   GERD (gastroesophageal reflux disease)    Headache    migraines   Hypertension    Mini stroke 2021   Osteopenia    Peripheral vascular disease (H[facility])    Shortness of breath dyspnea    Sleep apnea    doesn't use CPAP   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    She was given perioperative antibiotics:  Anti-infectives (From admission, onward)    Start     Dose/Rate Route Frequency Ordered Stop   12/11/22 1800  ceFAZolin (ANCEF) IVPB 2g/100 mL premix        2 g 200 mL/hr over 30 Minutes Intravenous Every 6 hours 12/11/22 1248 12/11/22 2352   12/11/22 0829  ceFAZolin (ANCEF) 2-4 GM/100ML-% IVPB       Note to Pharmacy: [facility] Blizzard A: cabinet override      12/11/22 0829 12/11/22 1847   12/11/22 0600  ceFAZolin (ANCEF) IVPB 2g/100 mL  premix        2 g 200 mL/hr over 30 Minutes Intravenous On call to O.R. 12/11/22 0029 12/11/22 1035     .  She was given sequential compression devices, early ambulation, and Xarelto for DVT prophylaxis.  She benefited maximally from the hospital stay and there were no complications.    Recent vital signs:  Vitals:   12/12/22 0413 12/12/22 0744  BP: 124/77 124/61  Pulse: 90 83  Resp: 20 17  Temp: 97.8 F (36.6 C) 98 F (36.7 C)  SpO2: 92% 99%    Recent laboratory studies:  Lab Results  Component Value Date   HGB 12.6 12/03/2022   HGB 13.3 08/25/2019   HGB 13.8 05/18/2019   Lab Results  Component Value Date   WBC 4.0 12/03/2022   PLT 137 (L) 12/03/2022   Lab Results  Component Value Date   INR 1.13 10/14/2017   Lab Results  Component Value Date   NA 140 12/03/2022   K 4.1 12/03/2022   CL 104 12/03/2022   CO2 30 12/03/2022   BUN 10 12/03/2022   CREATININE 1.10 (H) 12/03/2022   GLUCOSE 123 (H) 12/03/2022    Discharge Medications:   Allergies as of 12/12/2022       Reactions   Nsaids Other (See Comments)   Per Hematology   Wellbutrin [bupropion] Other (See Comments)  Thoughts that she would die        Medication List     TAKE these medications    Accu-Chek Aviva Plus test strip Generic drug: glucose blood   Accu-Chek Softclix Lancets lancets   ascorbic acid 500 MG tablet Commonly known as: VITAMIN C Take 1,000 mg by mouth daily.   atorvastatin 80 MG tablet Commonly known as: LIPITOR Take 80 mg by mouth every morning.   AZO CRANBERRY PO Take 2 capsules by mouth at bedtime.   clotrimazole 1 % cream Commonly known as: LOTRIMIN Apply 1 application topically 2 (two) times daily. What changed:  when to take this reasons to take this   diclofenac sodium 1 % Gel Commonly known as: VOLTAREN Apply 4 g topically 4 (four) times daily. What changed:  when to take this reasons to take this   docusate sodium 100 MG capsule Commonly known  as: COLACE Take 1 capsule (100 mg total) by mouth 2 (two) times daily.   DULoxetine 60 MG capsule Commonly known as: CYMBALTA Take 60 mg by mouth every morning.   HYDROcodone-acetaminophen 5-325 MG tablet Commonly known as: NORCO/VICODIN Take 1 tablet by mouth every 4 (four) hours as needed for moderate pain (pain score 4-6).   hydrOXYzine 25 MG tablet Commonly known as: ATARAX Take 25 mg by mouth at bedtime.   lisinopril 2.5 MG tablet Commonly known as: ZESTRIL Take 2.5 mg by mouth every morning.   memantine 10 MG tablet Commonly known as: NAMENDA Take 10 mg by mouth 2 (two) times daily.   methocarbamol 500 MG tablet Commonly known as: ROBAXIN Take 1 tablet (500 mg total) by mouth every 8 (eight) hours as needed for muscle spasms.   omeprazole 40 MG capsule Commonly known as: PRILOSEC Take 40 mg by mouth at bedtime.   rivaroxaban 10 MG Tabs tablet Commonly known as: XARELTO Take 1 tablet (10 mg total) by mouth daily with breakfast.               Durable Medical Equipment  (From admission, onward)           Start     Ordered   12/12/22 0805  For home use only DME 3 n 1  Once        12/12/22 0806   12/12/22 0805  For home use only DME Walker rolling  Once       Question Answer Comment  Walker: With 5 Inch Wheels   Patient needs a walker to treat with the following condition Osteoarthritis of right knee      12/12/22 0806            Diagnostic Studies: [place] OR NERVE BLOCK-IMAGE ONLY [facility])  Result Date: 12/11/2022 There is no interpretation for this exam.  This order is for images obtained during a surgical procedure.  Please See "Surgeries" Tab for more information regarding the procedure.   [place] OR NERVE BLOCK-IMAGE ONLY [facility])  Result Date: 12/11/2022 There is no interpretation for this exam.  This order is for images obtained during a surgical procedure.  Please See "Surgeries" Tab for more information regarding the procedure.    Disposition:  Discharge disposition: 01-Home or Self Care       Discharge Instructions     Face-to-face encounter (required for Medicare/Medicaid patients)   Complete by: As directed    I Carlynn Spry certify that this patient is under my care and that I, or a nurse practitioner or physician's assistant working with me,  had a face-to-face encounter that meets the physician face-to-face encounter requirements with this patient on 12/12/2022. The encounter with the patient was in whole, or in part for the following medical condition(s) which is the primary reason for home health care (List medical condition): s/p right total knee arthroplasty   The encounter with the patient was in whole, or in part, for the following medical condition, which is the primary reason for home health care: right knee osteoarthritis   I certify that, based on my findings, the following services are medically necessary home health services: Physical therapy   Reason for Medically Necessary Home Health Services: Therapy- Personnel officer, Public librarian   My clinical findings support the need for the above services: Pain interferes with ambulation/mobility   Further, I certify that my clinical findings support that this patient is homebound due to: Ambulates short distances less than 300 feet   Home Health   Complete by: As directed    To provide the following care/treatments: PT          Signed: Carlynn Spry ,PA-C 12/12/2022, 8:07 AM

## 2022-12-12 NOTE — Progress Notes (Signed)
CSW spoke with pts dtr about DME equipment ([facility]) ordered and being delivered to room prior to discharge.  [facility] contacted and informed that pt is discharging today.  Dawn Moore, Dawn Moore

## 2022-12-12 NOTE — Progress Notes (Cosign Needed)
Patient is not able to walk the distance required to go the bathroom, or he/she is unable to safely negotiate stairs required to access the bathroom.  A 3in1 BSC will alleviate this problem  

## 2022-12-12 NOTE — Plan of Care (Signed)
  Problem: Skin Integrity: Goal: Risk for impaired skin integrity will decrease Outcome: Adequate for Discharge   Problem: Nutritional: Goal: Maintenance of adequate nutrition will improve Outcome: Adequate for Discharge   Problem: Metabolic: Goal: Ability to maintain appropriate glucose levels will improve Outcome: Adequate for Discharge   Problem: Education: Goal: Knowledge of the prescribed therapeutic regimen will improve Outcome: Adequate for Discharge   Problem: Pain Management: Goal: Pain level will decrease with appropriate interventions Outcome: Adequate for Discharge   Problem: Activity: Goal: Ability to avoid complications of mobility impairment will improve Outcome: Adequate for Discharge   Problem: Elimination: Goal: Will not experience complications related to bowel motility Outcome: Adequate for Discharge   Problem: Coping: Goal: Level of anxiety will decrease Outcome: Adequate for Discharge

## 2022-12-12 NOTE — Progress Notes (Signed)
Reviewed discharged Instructions with pt. Daughter at bedside. Pt and daughter verbalized understanding. Pt discharged with polar care, 3 in 1, rolling walker, and all personal belongings. Staff wheeled pt out. PT transported to home via family vehicle.

## 2022-12-12 NOTE — TOC Transition Note (Signed)
Transition of Care [facility]) - CM/SW Discharge Note   Patient Details  Name: Dawn Moore MRN: [id number] Date of Birth: 12/26/1941  Transition of [facility] CM/SW Contact:  Quin Hoop, LCSW Phone Number: 12/12/2022, 4:04 PM   Clinical Narrative:     Patient d/c home with [facility] providing [place] services.  Patient rec'd RW and 3-in-1 prior to discharge home.  TOC signed off.   Final next level of care: [place] Barriers to Discharge: No Barriers Identified   Patient Goals and CMS Choice Patient states their goals for this hospitalization and ongoing recovery are:: to go home      Discharge Placement                       Discharge Plan and Services                DME Arranged: 3-N-1, Walker rolling DME Agency: [employer]                  Social Determinants of Health (SDOH) Interventions     Readmission Risk Interventions     No data to display

## 2022-12-12 NOTE — Discharge Instructions (Signed)
Continue weight bear as tolerated on the right lower extremity.    Elevate the right lower extremity whenever possible and continue the polar care while elevating the extremity. Patient may shower. No bath or submerging the wound.    Take xarelto as directed for blood clot prevention.  Continue to work on knee range of motion exercises at home as instructed by physical therapy. Continue to use a walker for assistance with ambulation until cleared by physical therapy.  Call [phone] with any questions, such as fever > 101.5 degrees, drainage from the wound or shortness of breath.

## 2023-01-31 ENCOUNTER — Encounter: Payer: Self-pay | Admitting: Emergency Medicine

## 2023-01-31 ENCOUNTER — Emergency Department
Admission: EM | Admit: 2023-01-31 | Discharge: 2023-01-31 | Disposition: A | Discharge status: Home / Self Care | Payer: Medicare HMO | Attending: Emergency Medicine | Admitting: Emergency Medicine

## 2023-01-31 ENCOUNTER — Emergency Department: Payer: Medicare HMO

## 2023-01-31 DIAGNOSIS — S8001XA Contusion of right knee, initial encounter: Secondary | ICD-10-CM | POA: Insufficient documentation

## 2023-01-31 DIAGNOSIS — M25561 Pain in right knee: Secondary | ICD-10-CM | POA: Diagnosis present

## 2023-01-31 DIAGNOSIS — Y92410 Unspecified street and highway as the place of occurrence of the external cause: Secondary | ICD-10-CM | POA: Insufficient documentation
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
